# Patient Record
Sex: Male | Born: 1970
Health system: Southern US, Community
[De-identification: ages and names within clinical notes are randomized; demographics above are authoritative.]

## PROBLEM LIST (undated history)

## (undated) DIAGNOSIS — Z8719 Personal history of other diseases of the digestive system: Secondary | ICD-10-CM

## (undated) DIAGNOSIS — R079 Chest pain, unspecified: Secondary | ICD-10-CM

## (undated) DIAGNOSIS — E785 Hyperlipidemia, unspecified: Secondary | ICD-10-CM

## (undated) DIAGNOSIS — F329 Major depressive disorder, single episode, unspecified: Secondary | ICD-10-CM

## (undated) DIAGNOSIS — F419 Anxiety disorder, unspecified: Secondary | ICD-10-CM

## (undated) DIAGNOSIS — I2510 Atherosclerotic heart disease of native coronary artery without angina pectoris: Secondary | ICD-10-CM

## (undated) DIAGNOSIS — K219 Gastro-esophageal reflux disease without esophagitis: Secondary | ICD-10-CM

## (undated) DIAGNOSIS — F32A Depression, unspecified: Secondary | ICD-10-CM

## (undated) DIAGNOSIS — F411 Generalized anxiety disorder: Secondary | ICD-10-CM

## (undated) DIAGNOSIS — I214 Non-ST elevation (NSTEMI) myocardial infarction: Secondary | ICD-10-CM

## (undated) HISTORY — PX: FRACTURE SURGERY: SHX138

## (undated) HISTORY — DX: Hyperlipidemia, unspecified: E78.5

## (undated) HISTORY — DX: Generalized anxiety disorder: F41.1

## (undated) HISTORY — PX: ORIF TIBIA & FIBULA FRACTURES: SHX2131

---

## 2009-10-03 ENCOUNTER — Emergency Department (HOSPITAL_COMMUNITY): Admission: EM | Admit: 2009-10-03 | Discharge: 2009-10-03 | Payer: Self-pay | Admitting: Emergency Medicine

## 2010-12-10 LAB — COMPREHENSIVE METABOLIC PANEL
Alkaline Phosphatase: 100 U/L (ref 39–117)
CO2: 24 mEq/L (ref 19–32)
Calcium: 9.5 mg/dL (ref 8.4–10.5)
Chloride: 106 mEq/L (ref 96–112)
GFR calc Af Amer: >60 mL/min (ref >60)
Glucose, Bld: 125 mg/dL — ABNORMAL HIGH (ref 70–99)
Sodium: 140 mEq/L (ref 135–145)

## 2010-12-10 LAB — DIFFERENTIAL
Eosinophils Absolute: 0.2 10*3/uL (ref 0.0–0.7)
Eosinophils Relative: 2 % (ref 0–5)
Lymphocytes Relative: 31 % (ref 12–46)
Lymphs Abs: 4 10*3/uL (ref 0.7–4.0)
Neutro Abs: 7.6 10*3/uL (ref 1.7–7.7)
Neutrophils Relative %: 59 % (ref 43–77)

## 2010-12-10 LAB — CBC
HCT: 45.7 % (ref 39.0–52.0)
MCHC: 34.5 g/dL (ref 30.0–36.0)
MCV: 93.8 fL (ref 78.0–100.0)
Platelets: 390 10*3/uL (ref 150–400)
WBC: 12.9 10*3/uL — ABNORMAL HIGH (ref 4.0–10.5)

## 2010-12-10 LAB — CK TOTAL AND CKMB (NOT AT ARMC)
CK, MB: 1 ng/mL (ref 0.3–4.0)
Relative Index: 0.9 (ref 0.0–2.5)

## 2010-12-10 LAB — GLUCOSE, CAPILLARY

## 2010-12-10 LAB — TROPONIN I: Troponin I: 0.01 ng/mL (ref 0.00–0.06)

## 2011-03-09 ENCOUNTER — Emergency Department (HOSPITAL_COMMUNITY)
Admission: EM | Admit: 2011-03-09 | Discharge: 2011-03-09 | Disposition: A | Discharge status: Home / Self Care | Payer: 59 | Attending: Emergency Medicine | Admitting: Emergency Medicine

## 2011-03-09 ENCOUNTER — Emergency Department (HOSPITAL_COMMUNITY): Payer: 59

## 2011-03-09 DIAGNOSIS — K2970 Gastritis, unspecified, without bleeding: Secondary | ICD-10-CM | POA: Insufficient documentation

## 2011-03-09 DIAGNOSIS — K224 Dyskinesia of esophagus: Secondary | ICD-10-CM | POA: Insufficient documentation

## 2011-03-09 DIAGNOSIS — R209 Unspecified disturbances of skin sensation: Secondary | ICD-10-CM | POA: Insufficient documentation

## 2011-03-09 DIAGNOSIS — K2990 Gastroduodenitis, unspecified, without bleeding: Secondary | ICD-10-CM | POA: Insufficient documentation

## 2011-03-09 DIAGNOSIS — I10 Essential (primary) hypertension: Secondary | ICD-10-CM | POA: Insufficient documentation

## 2011-03-09 DIAGNOSIS — R0989 Other specified symptoms and signs involving the circulatory and respiratory systems: Secondary | ICD-10-CM | POA: Insufficient documentation

## 2011-03-09 DIAGNOSIS — K219 Gastro-esophageal reflux disease without esophagitis: Secondary | ICD-10-CM | POA: Insufficient documentation

## 2011-03-09 DIAGNOSIS — R0789 Other chest pain: Secondary | ICD-10-CM | POA: Insufficient documentation

## 2011-03-09 DIAGNOSIS — F411 Generalized anxiety disorder: Secondary | ICD-10-CM | POA: Insufficient documentation

## 2011-03-09 DIAGNOSIS — R110 Nausea: Secondary | ICD-10-CM | POA: Insufficient documentation

## 2011-03-09 DIAGNOSIS — R0609 Other forms of dyspnea: Secondary | ICD-10-CM | POA: Insufficient documentation

## 2011-03-09 LAB — CBC
Hemoglobin: 14.9 g/dL (ref 13.0–17.0)
MCHC: 33 g/dL (ref 30.0–36.0)
MCV: 88.3 fL (ref 78.0–100.0)
RBC: 5.11 MIL/uL (ref 4.22–5.81)
RDW: 13.2 % (ref 11.5–15.5)

## 2011-03-09 LAB — CK TOTAL AND CKMB (NOT AT ARMC)
CK, MB: 1.3 ng/mL (ref 0.3–4.0)
Relative Index: 1.1 (ref 0.0–2.5)
Relative Index: 1.1 (ref 0.0–2.5)
Total CK: 122 U/L (ref 7–232)
Total CK: 120 U/L (ref 7–232)

## 2011-03-09 LAB — COMPREHENSIVE METABOLIC PANEL
ALT: 29 U/L (ref 0–53)
BUN: 11 mg/dL (ref 6–23)
CO2: 27 mEq/L (ref 19–32)
Chloride: 104 mEq/L (ref 96–112)
Glucose, Bld: 96 mg/dL (ref 70–99)
Potassium: 3.9 mEq/L (ref 3.5–5.1)
Total Bilirubin: 0.4 mg/dL (ref 0.3–1.2)

## 2011-03-09 LAB — DIFFERENTIAL
Basophils Absolute: 0 10*3/uL (ref 0.0–0.1)
Basophils Relative: 0 % (ref 0–1)
Lymphocytes Relative: 12 % (ref 12–46)
Lymphs Abs: 1.6 10*3/uL (ref 0.7–4.0)
Monocytes Absolute: 0.8 10*3/uL (ref 0.1–1.0)
Monocytes Relative: 6 % (ref 3–12)
Neutrophils Relative %: 81 % — ABNORMAL HIGH (ref 43–77)

## 2011-03-09 LAB — TROPONIN I: Troponin I: <0.3 ng/mL (ref <0.30)

## 2013-01-22 ENCOUNTER — Telehealth: Payer: Self-pay | Admitting: Family Medicine

## 2013-01-22 NOTE — Telephone Encounter (Signed)
Please let patient know when alprazolam is called in

## 2013-02-23 ENCOUNTER — Telehealth: Payer: Self-pay | Admitting: Family Medicine

## 2013-02-23 NOTE — Telephone Encounter (Signed)
Patient needs a prescription for enough Xanax to get him through until his appointment on Monday June 9th  Thanks

## 2013-02-23 NOTE — Telephone Encounter (Signed)
Refill called into [facility]. Patient notified.

## 2013-02-23 NOTE — Telephone Encounter (Signed)
Refill times one, keep appointment

## 2013-02-24 ENCOUNTER — Encounter: Payer: Self-pay | Admitting: *Deleted

## 2013-03-02 ENCOUNTER — Ambulatory Visit (INDEPENDENT_AMBULATORY_CARE_PROVIDER_SITE_OTHER): Payer: Self-pay | Admitting: Family Medicine

## 2013-03-02 ENCOUNTER — Encounter: Payer: Self-pay | Admitting: Family Medicine

## 2013-03-02 VITALS — BP 142/90 | HR 88 | Wt 223.8 lb

## 2013-03-02 DIAGNOSIS — Z79899 Other long term (current) drug therapy: Secondary | ICD-10-CM

## 2013-03-02 DIAGNOSIS — F411 Generalized anxiety disorder: Secondary | ICD-10-CM

## 2013-03-02 DIAGNOSIS — E785 Hyperlipidemia, unspecified: Secondary | ICD-10-CM | POA: Insufficient documentation

## 2013-03-02 MED ORDER — PRAVASTATIN SODIUM 80 MG PO TABS: 80.0000 mg | ORAL_TABLET | Freq: Every day | ORAL | Status: DC

## 2013-03-02 MED ORDER — CITALOPRAM HYDROBROMIDE 20 MG PO TABS: 20.0000 mg | ORAL_TABLET | Freq: Every day | ORAL | Status: DC

## 2013-03-02 MED ORDER — ALPRAZOLAM 0.5 MG PO TABS: 0.5000 mg | ORAL_TABLET | Freq: Four times a day (QID) | ORAL | Status: DC | PRN

## 2013-03-02 NOTE — Patient Instructions (Signed)
Hypertension Hypertension is another name for high blood pressure. High blood pressure may mean that your heart needs to work harder to pump blood. Blood pressure consists of two numbers, which includes a higher number over a lower number (example: 110/72). HOME CARE   Make lifestyle changes as told by your doctor. This may include weight loss and exercise.  Take your blood pressure medicine every day.  Limit how much salt you use.  Stop smoking if you smoke.  Do not use drugs.  Talk to your doctor if you are using decongestants or birth control pills. These medicines might make blood pressure higher.  Females should not drink more than 1 alcoholic drink per day. Males should not drink more than 2 alcoholic drinks per day.  See your doctor as told. GET HELP RIGHT AWAY IF:   You have a blood pressure reading with a top number of 180 or higher.  You get a very bad headache.  You get blurred or changing vision.  You feel confused.  You feel weak, numb, or faint.  You get chest or belly (abdominal) pain.  You throw up (vomit).  You cannot breathe very well. MAKE SURE YOU:   Understand these instructions.  Will watch your condition.  Will get help right away if you are not doing well or get worse. Document Released: 02/27/2008 Document Revised: 12/03/2011 Document Reviewed: 02/27/2008 [facility] Patient Information 2014 [place], [place].   DASH Diet The DASH diet stands for "Dietary Approaches to Stop Hypertension." It is a healthy eating plan that has been shown to reduce high blood pressure (hypertension) in as little as 14 days, while also possibly providing other significant health benefits. These other health benefits include reducing the risk of breast cancer after menopause and reducing the risk of type 2 diabetes, heart disease, colon cancer, and stroke. Health benefits also include weight loss and slowing kidney failure in patients with chronic kidney disease.  DIET  GUIDELINES  Limit salt (sodium). Your diet should contain less than 1500 mg of sodium daily.  Limit refined or processed carbohydrates. Your diet should include mostly whole grains. Desserts and added sugars should be used sparingly.  Include small amounts of heart-healthy fats. These types of fats include nuts, oils, and tub margarine. Limit saturated and trans fats. These fats have been shown to be harmful in the body. CHOOSING FOODS  The following food groups are based on a 2000 calorie diet. See your Registered Dietitian for individual calorie needs. Grains and Grain Products (6 to 8 servings daily)  Eat More Often: Whole-wheat bread, brown rice, whole-grain or wheat pasta, quinoa, popcorn without added fat or salt (air popped).  Eat Less Often: White bread, white pasta, white rice, cornbread. Vegetables (4 to 5 servings daily)  Eat More Often: Fresh, frozen, and canned vegetables. Vegetables may be raw, steamed, roasted, or grilled with a minimal amount of fat.  Eat Less Often/Avoid: Creamed or fried vegetables. Vegetables in a cheese sauce. Fruit (4 to 5 servings daily)  Eat More Often: All fresh, canned (in natural juice), or frozen fruits. Dried fruits without added sugar. One hundred percent fruit juice ( cup [237 mL] daily).  Eat Less Often: Dried fruits with added sugar. Canned fruit in light or heavy syrup. [employer], Fish, and Poultry (2 servings or less daily. One serving is 3 to 4 oz [85-114 g]).  Eat More Often: Ninety percent or leaner ground beef, tenderloin, sirloin. Round cuts of beef, chicken breast, [place] breast. All fish.  Grill, bake, or broil your meat. Nothing should be fried.  Eat Less Often/Avoid: Fatty cuts of meat, [place], or chicken leg, thigh, or wing. Fried cuts of meat or fish. Dairy (2 to 3 servings)  Eat More Often: Low-fat or fat-free milk, low-fat plain or light yogurt, reduced-fat or part-skim cheese.  Eat Less Often/Avoid: Milk (whole,  2%).Whole milk yogurt. Full-fat cheeses. Nuts, Seeds, and Legumes (4 to 5 servings per week)  Eat More Often: All without added salt.  Eat Less Often/Avoid: Salted nuts and seeds, canned beans with added salt. Fats and Sweets (limited)  Eat More Often: Vegetable oils, tub margarines without trans fats, sugar-free gelatin. Mayonnaise and salad dressings.  Eat Less Often/Avoid: Coconut oils, palm oils, butter, stick margarine, cream, half and half, cookies, candy, pie. FOR MORE INFORMATION The Dash Diet Eating Plan: www.dashdiet.org Document Released: 08/30/2011 Document Revised: 12/03/2011 Document Reviewed: 08/30/2011 [facility] Patient Information 2014 [place], [place].

## 2013-03-02 NOTE — Progress Notes (Signed)
  Subjective:    Patient ID: Bruce King, male    DOB: 09-22-1971, 42 y.o.   MRN: [id number]  HPI No chest tightness pressure pain shortness of breath he states he takes his medicine as directed he does smoke he knows he needs to quit. He does not exercise on a regular basis. Now working 2 jobs. Feels overall he's doing fairly good. Would like refills on his medication. He denies abusing his medication.   Review of Systems  Constitutional: Negative for appetite change and fatigue.  Respiratory: Negative for cough, chest tightness and shortness of breath.   Cardiovascular: Negative for chest pain.  Gastrointestinal: Negative for abdominal pain.  Musculoskeletal: Positive for arthralgias (with activity).  Psychiatric/Behavioral:       Xanax and celexa does well for anxiety       Objective:   Physical Exam  Vitals reviewed. Constitutional: He appears well-developed.  HENT:  Right Ear: External ear normal.  Left Ear: External ear normal.  Mouth/Throat: Oropharynx is clear and moist.  Neck: No thyromegaly present.  Cardiovascular: Normal rate, regular rhythm and normal heart sounds.   Pulmonary/Chest: Effort normal and breath sounds normal. No respiratory distress.  Musculoskeletal: He exhibits no edema.  Lymphadenopathy:    He has no cervical adenopathy.          Assessment & Plan:  Anxiety-Celexa and Xanax as directed. Hyperlipidemia check lab work. Continue medication. He uses medicinemedication lays off of that if he is having arthralgias. He was given a month's supply of Xanax with 4 refills this should last him for 5 months he needs to followup office visit before more.

## 2013-08-10 ENCOUNTER — Other Ambulatory Visit: Payer: Self-pay | Admitting: *Deleted

## 2013-08-10 MED ORDER — ALPRAZOLAM 0.5 MG PO TABS: 0.5000 mg | ORAL_TABLET | Freq: Four times a day (QID) | ORAL | Status: DC | PRN

## 2013-09-11 ENCOUNTER — Telehealth: Payer: Self-pay | Admitting: Family Medicine

## 2013-09-11 ENCOUNTER — Other Ambulatory Visit: Payer: Self-pay

## 2013-09-11 MED ORDER — ALPRAZOLAM 0.5 MG PO TABS: 0.5000 mg | ORAL_TABLET | Freq: Four times a day (QID) | ORAL | Status: DC | PRN

## 2013-09-11 NOTE — Telephone Encounter (Signed)
Notified patient script will be faxed to pharmacy. Patient verbalized understanding.

## 2013-09-11 NOTE — Telephone Encounter (Signed)
Patient needs Rx for xanax to [facility]

## 2013-09-11 NOTE — Telephone Encounter (Signed)
Last office visit 03/02/13 

## 2013-09-11 NOTE — Telephone Encounter (Signed)
Xanax #60, 1 qid prn, needs OV

## 2013-09-29 ENCOUNTER — Ambulatory Visit (INDEPENDENT_AMBULATORY_CARE_PROVIDER_SITE_OTHER): Payer: Self-pay | Admitting: Family Medicine

## 2013-09-29 ENCOUNTER — Encounter: Payer: Self-pay | Admitting: Family Medicine

## 2013-09-29 VITALS — BP 124/84 | Ht 73.0 in | Wt 220.0 lb

## 2013-09-29 DIAGNOSIS — R358 Other polyuria: Secondary | ICD-10-CM

## 2013-09-29 DIAGNOSIS — R3589 Other polyuria: Secondary | ICD-10-CM

## 2013-09-29 DIAGNOSIS — F411 Generalized anxiety disorder: Secondary | ICD-10-CM

## 2013-09-29 DIAGNOSIS — E785 Hyperlipidemia, unspecified: Secondary | ICD-10-CM

## 2013-09-29 MED ORDER — ALPRAZOLAM 0.5 MG PO TABS: 0.5000 mg | ORAL_TABLET | Freq: Four times a day (QID) | ORAL | Status: DC | PRN

## 2013-09-29 NOTE — Progress Notes (Signed)
   Subjective:    Patient ID: Bruce King, male    DOB: 11/07/1970, 43 y.o.   MRN: [id number]  HPIHere for a med check. No concerns.  Still smoking- knows he needs to quit Watching diet Working 60 hours a week No chest pain no DOE He does relate that he needs to quit smoking he's planning on trying to do stuff  Review of Systems    he denies headaches shortness breath chest tightness pressure pain vomiting or diarrhea Objective:   Physical Exam Lungs are clear hearts regular pulse normal extremities no edema blood pressure good       Assessment & Plan:  hyperlip- using very other day, will check labs Anxiety stable, refills given followup 6 months

## 2013-09-29 NOTE — Patient Instructions (Signed)
DASH Diet  The DASH diet stands for "Dietary Approaches to Stop Hypertension." It is a healthy eating plan that has been shown to reduce high blood pressure (hypertension) in as little as 14 days, while also possibly providing other significant health benefits. These other health benefits include reducing the risk of breast cancer after menopause and reducing the risk of type 2 diabetes, heart disease, colon cancer, and stroke. Health benefits also include weight loss and slowing kidney failure in patients with chronic kidney disease.   DIET GUIDELINES  · Limit salt (sodium). Your diet should contain less than 1500 mg of sodium daily.  · Limit refined or processed carbohydrates. Your diet should include mostly whole grains. Desserts and added sugars should be used sparingly.  · Include small amounts of heart-healthy fats. These types of fats include nuts, oils, and tub margarine. Limit saturated and trans fats. These fats have been shown to be harmful in the body.  CHOOSING FOODS   The following food groups are based on a 2000 calorie diet. See your Registered Dietitian for individual calorie needs.  Grains and Grain Products (6 to 8 servings daily)  · Eat More Often: Whole-wheat bread, brown rice, whole-grain or wheat pasta, quinoa, popcorn without added fat or salt (air popped).  · Eat Less Often: White bread, white pasta, white rice, cornbread.  Vegetables (4 to 5 servings daily)  · Eat More Often: Fresh, frozen, and canned vegetables. Vegetables may be raw, steamed, roasted, or grilled with a minimal amount of fat.  · Eat Less Often/Avoid: Creamed or fried vegetables. Vegetables in a cheese sauce.  Fruit (4 to 5 servings daily)  · Eat More Often: All fresh, canned (in natural juice), or frozen fruits. Dried fruits without added sugar. One hundred percent fruit juice (½ cup [237 mL] daily).  · Eat Less Often: Dried fruits with added sugar. Canned fruit in light or heavy syrup.  Lean Meats, Fish, and Poultry (2  servings or less daily. One serving is 3 to 4 oz [85-114 g]).  · Eat More Often: Ninety percent or leaner ground beef, tenderloin, sirloin. Round cuts of beef, chicken breast, turkey breast. All fish. Grill, bake, or broil your meat. Nothing should be fried.  · Eat Less Often/Avoid: Fatty cuts of meat, turkey, or chicken leg, thigh, or wing. Fried cuts of meat or fish.  Dairy (2 to 3 servings)  · Eat More Often: Low-fat or fat-free milk, low-fat plain or light yogurt, reduced-fat or part-skim cheese.  · Eat Less Often/Avoid: Milk (whole, 2%). Whole milk yogurt. Full-fat cheeses.  Nuts, Seeds, and Legumes (4 to 5 servings per week)  · Eat More Often: All without added salt.  · Eat Less Often/Avoid: Salted nuts and seeds, canned beans with added salt.  Fats and Sweets (limited)  · Eat More Often: Vegetable oils, tub margarines without trans fats, sugar-free gelatin. Mayonnaise and salad dressings.  · Eat Less Often/Avoid: Coconut oils, palm oils, butter, stick margarine, cream, half and half, cookies, candy, pie.  FOR MORE INFORMATION  The Dash Diet Eating Plan: www.dashdiet.org  Document Released: 08/30/2011 Document Revised: 12/03/2011 Document Reviewed: 08/30/2011  ExitCare® Patient Information ©2014 [employer].

## 2014-03-29 ENCOUNTER — Telehealth: Payer: Self-pay

## 2014-03-29 MED ORDER — ALPRAZOLAM 0.5 MG PO TABS: 0.5000 mg | ORAL_TABLET | Freq: Four times a day (QID) | ORAL | Status: DC | PRN

## 2014-03-29 NOTE — Telephone Encounter (Signed)
Needs refill on Alprazolam 0.5 mg. Last seen 09/29/13.

## 2014-03-29 NOTE — Telephone Encounter (Signed)
Ok times one mo needs ov before further

## 2014-03-29 NOTE — Telephone Encounter (Signed)
Script printed and will be faxed to pharmacy today.

## 2014-04-15 ENCOUNTER — Other Ambulatory Visit: Payer: Self-pay | Admitting: *Deleted

## 2014-04-15 ENCOUNTER — Telehealth: Payer: Self-pay | Admitting: Family Medicine

## 2014-04-15 MED ORDER — CITALOPRAM HYDROBROMIDE 20 MG PO TABS: 20.0000 mg | ORAL_TABLET | Freq: Every day | ORAL | Status: DC

## 2014-04-15 NOTE — Telephone Encounter (Signed)
Patient notified and verbalized understanding. I then transferred pt up front to schedule OV

## 2014-04-15 NOTE — Telephone Encounter (Signed)
Pt needs RF on his Celexa, pharmacy states they've sent it to us twice this week and we've not done anything with it.  [facility], please call pt when done  Told pt he should hear from us later this afternoon,

## 2014-04-29 ENCOUNTER — Telehealth: Payer: Self-pay | Admitting: *Deleted

## 2014-04-29 MED ORDER — ALPRAZOLAM 0.5 MG PO TABS: 0.5000 mg | ORAL_TABLET | Freq: Four times a day (QID) | ORAL | Status: DC | PRN

## 2014-04-29 NOTE — Addendum Note (Signed)
Addended by: Margaretha SheffieldBROWN, Alanie Syler S on: 04/29/2014 03:30 PM   Modules accepted: Orders

## 2014-04-29 NOTE — Telephone Encounter (Signed)
Pt called stating his pharmacy sent in a refill request per pt he needs his refills ASAP. Please advise [phone]

## 2014-04-29 NOTE — Telephone Encounter (Signed)
Needs refill of xanax was last seen 1/15 and advised last month he needs office visit.

## 2014-04-29 NOTE — Telephone Encounter (Signed)
Rx faxed to pharmacy. Patient states he has an appt next week to follow up with doctor.

## 2014-04-29 NOTE — Telephone Encounter (Signed)
He may have a day prescription. Needs office visit next week, this type of medication requires regular office visits.

## 2014-05-06 ENCOUNTER — Encounter: Payer: Self-pay | Admitting: Family Medicine

## 2014-05-06 ENCOUNTER — Ambulatory Visit (INDEPENDENT_AMBULATORY_CARE_PROVIDER_SITE_OTHER): Payer: Self-pay | Admitting: Family Medicine

## 2014-05-06 VITALS — BP 140/86 | Ht 73.0 in | Wt 223.4 lb

## 2014-05-06 DIAGNOSIS — E785 Hyperlipidemia, unspecified: Secondary | ICD-10-CM

## 2014-05-06 DIAGNOSIS — F411 Generalized anxiety disorder: Secondary | ICD-10-CM

## 2014-05-06 MED ORDER — ALPRAZOLAM 0.5 MG PO TABS: 0.5000 mg | ORAL_TABLET | Freq: Four times a day (QID) | ORAL | Status: DC | PRN

## 2014-05-06 MED ORDER — ATORVASTATIN CALCIUM 10 MG PO TABS: 10.0000 mg | ORAL_TABLET | Freq: Every day | ORAL | Status: DC

## 2014-05-06 MED ORDER — CITALOPRAM HYDROBROMIDE 20 MG PO TABS: 20.0000 mg | ORAL_TABLET | Freq: Every day | ORAL | Status: DC

## 2014-05-06 NOTE — Patient Instructions (Signed)
DASH Eating Plan °DASH stands for "Dietary Approaches to Stop Hypertension." The DASH eating plan is a healthy eating plan that has been shown to reduce high blood pressure (hypertension). Additional health benefits may include reducing the risk of type 2 diabetes mellitus, heart disease, and stroke. The DASH eating plan may also help with weight loss. °WHAT DO I NEED TO KNOW ABOUT THE DASH EATING PLAN? °For the DASH eating plan, you will follow these general guidelines: °· Choose foods with a percent daily value for sodium of less than 5% (as listed on the food label). °· Use salt-free seasonings or herbs instead of table salt or sea salt. °· Check with your health care provider or pharmacist before using salt substitutes. °· Eat lower-sodium products, often labeled as "lower sodium" or "no salt added." °· Eat fresh foods. °· Eat more vegetables, fruits, and low-fat dairy products. °· Choose whole grains. Look for the word "whole" as the first word in the ingredient list. °· Choose fish and skinless chicken or turkey more often than red meat. Limit fish, poultry, and meat to 6 oz (170 g) each day. °· Limit sweets, desserts, sugars, and sugary drinks. °· Choose heart-healthy fats. °· Limit cheese to 1 oz (28 g) per day. °· Eat more home-cooked food and less restaurant, buffet, and fast food. °· Limit fried foods. °· Cook foods using methods other than frying. °· Limit canned vegetables. If you do use them, rinse them well to decrease the sodium. °· When eating at a restaurant, ask that your food be prepared with less salt, or no salt if possible. °WHAT FOODS CAN I EAT? °Seek help from a dietitian for individual calorie needs. °Grains °Whole grain or whole wheat bread. Brown rice. Whole grain or whole wheat pasta. Quinoa, bulgur, and whole grain cereals. Low-sodium cereals. Corn or whole wheat flour tortillas. Whole grain cornbread. Whole grain crackers. Low-sodium crackers. °Vegetables °Fresh or frozen vegetables  (raw, steamed, roasted, or grilled). Low-sodium or reduced-sodium tomato and vegetable juices. Low-sodium or reduced-sodium tomato sauce and paste. Low-sodium or reduced-sodium canned vegetables.  °Fruits °All fresh, canned (in natural juice), or frozen fruits. °Meat and Other Protein Products °Ground beef (85% or leaner), grass-fed beef, or beef trimmed of fat. Skinless chicken or turkey. Ground chicken or turkey. Pork trimmed of fat. All fish and seafood. Eggs. Dried beans, peas, or lentils. Unsalted nuts and seeds. Unsalted canned beans. °Dairy °Low-fat dairy products, such as skim or 1% milk, 2% or reduced-fat cheeses, low-fat ricotta or cottage cheese, or plain low-fat yogurt. Low-sodium or reduced-sodium cheeses. °Fats and Oils °Tub margarines without trans fats. Light or reduced-fat mayonnaise and salad dressings (reduced sodium). Avocado. Safflower, olive, or canola oils. Natural peanut or almond butter. °Other °Unsalted popcorn and pretzels. °The items listed above may not be a complete list of recommended foods or beverages. Contact your dietitian for more options. °WHAT FOODS ARE NOT RECOMMENDED? °Grains °White bread. White pasta. White rice. Refined cornbread. Bagels and croissants. Crackers that contain trans fat. °Vegetables °Creamed or fried vegetables. Vegetables in a cheese sauce. Regular canned vegetables. Regular canned tomato sauce and paste. Regular tomato and vegetable juices. °Fruits °Dried fruits. Canned fruit in light or heavy syrup. Fruit juice. °Meat and Other Protein Products °Fatty cuts of meat. Ribs, chicken wings, bacon, sausage, bologna, salami, chitterlings, fatback, hot dogs, bratwurst, and packaged luncheon meats. Salted nuts and seeds. Canned beans with salt. °Dairy °Whole or 2% milk, cream, half-and-half, and cream cheese. Whole-fat or sweetened yogurt. Full-fat   cheeses or blue cheese. Nondairy creamers and whipped toppings. Processed cheese, cheese spreads, or cheese  curds. °Condiments °Onion and garlic salt, seasoned salt, table salt, and sea salt. Canned and packaged gravies. Worcestershire sauce. Tartar sauce. Barbecue sauce. Teriyaki sauce. Soy sauce, including reduced sodium. Steak sauce. Fish sauce. Oyster sauce. Cocktail sauce. Horseradish. Ketchup and mustard. Meat flavorings and tenderizers. Bouillon cubes. Hot sauce. Tabasco sauce. Marinades. Taco seasonings. Relishes. °Fats and Oils °Butter, stick margarine, lard, shortening, ghee, and bacon fat. Coconut, palm kernel, or palm oils. Regular salad dressings. °Other °Pickles and olives. Salted popcorn and pretzels. °The items listed above may not be a complete list of foods and beverages to avoid. Contact your dietitian for more information. °WHERE CAN I FIND MORE INFORMATION? °National Heart, Lung, and Blood Institute: www.nhlbi.nih.gov/health/health-topics/topics/dash/ °Document Released: 08/30/2011 Document Revised: 01/25/2014 Document Reviewed: 07/15/2013 °ExitCare® Patient Information ©2015 [employer]. This information is not intended to replace advice given to you by your health care provider. Make sure you discuss any questions you have with your health care provider. ° °

## 2014-05-06 NOTE — Progress Notes (Signed)
   Subjective:    Patient ID: Bruce King, male    DOB: 11/05/1970, 43 y.o.   MRN: [id number]  Hyperlipidemia This is a chronic problem. The current episode started more than 1 year ago. Current antihyperlipidemic treatment includes statins (pravastatin). Risk factors for coronary artery disease include dyslipidemia.   Denies being depressed does have some anxiety issues periods no smokes he knows he needs to quit unable to tolerate pravastatin due to soreness but wants to be on a statin. Patient cannot afford brand name. Wants generic Lipitor.   Review of Systems Patient denies chest pain shortness of breath nausea vomiting diarrhea does relate 1 panic attack since last being seen    Objective:   Physical Exam  His lungs are clear heart regular pulse normal blood pressure was checked twice 124/88 extremities no edema skin warm dry      Assessment & Plan:  Blood pressure borderline gas minimize salt use. Try to quit smoking. Stay physically active. Try to lose weight.  Chronic anxiety issues refills on medications given followup in 6 months  Patient counseled to quit smoking  Hyperlipidemia did not tolerate pravastatin try Lipitor 10 mg daily patient will call in October in order for ostial order lipid and liver profile followup 6 months

## 2014-08-11 ENCOUNTER — Encounter: Payer: Self-pay | Admitting: Family Medicine

## 2014-08-11 ENCOUNTER — Ambulatory Visit (INDEPENDENT_AMBULATORY_CARE_PROVIDER_SITE_OTHER): Payer: Self-pay | Admitting: Family Medicine

## 2014-08-11 VITALS — BP 134/90 | Ht 73.0 in | Wt 233.0 lb

## 2014-08-11 DIAGNOSIS — M5412 Radiculopathy, cervical region: Secondary | ICD-10-CM

## 2014-08-11 MED ORDER — PREDNISONE 20 MG PO TABS: ORAL_TABLET | ORAL | Status: DC

## 2014-08-11 MED ORDER — HYDROCODONE-ACETAMINOPHEN 10-325 MG PO TABS: 1.0000 | ORAL_TABLET | ORAL | Status: DC | PRN

## 2014-08-11 NOTE — Progress Notes (Signed)
   Subjective:    Patient ID: Bruce King, male    DOB: 10/28/1970, 43 y.o.   MRN: [id number]  Shoulder Pain  The pain is present in the right shoulder and right arm. This is a new problem. Episode onset: About 6 weeks ago. There has been no history of extremity trauma. The problem occurs constantly. The problem has been gradually worsening. The quality of the pain is described as sharp. Associated symptoms include numbness and tingling. The symptoms are aggravated by activity. He has tried NSAIDS for the symptoms. The treatment provided mild relief.    Patient relates the pain is in the upper portion of his back neck mainly in his shoulder radiates down the bicep into the hand causes numbness into the hand present for about 6 weeks  Review of Systems  Neurological: Positive for tingling and numbness.       Objective:   Physical Exam  Neck patient has increased discomfort with rotation to the right and tilting to the right subjective discomfort into the trapezius subjective discomfort into the shoulder internal/external rotation of the shoulder is normal no obvious sign of rotator cuff tear. Reflexes good strength in the hand muscles are good.      Assessment & Plan:  Significant cervical neuralgia prednisone taper hydrocodone for pain cautioned drowsiness not for long-term use #30 follow-up in several weeks may need referral for further testing. Hopefully this will get better without. If weakness follow-up immediately

## 2014-08-22 ENCOUNTER — Encounter (HOSPITAL_COMMUNITY): Payer: Self-pay | Admitting: *Deleted

## 2014-08-22 ENCOUNTER — Emergency Department (HOSPITAL_COMMUNITY): Payer: 59

## 2014-08-22 ENCOUNTER — Emergency Department (HOSPITAL_COMMUNITY)
Admission: EM | Admit: 2014-08-22 | Discharge: 2014-08-22 | Disposition: A | Discharge status: Home / Self Care | Payer: 59 | Attending: Emergency Medicine | Admitting: Emergency Medicine

## 2014-08-22 DIAGNOSIS — M545 Low back pain: Secondary | ICD-10-CM | POA: Insufficient documentation

## 2014-08-22 DIAGNOSIS — R0781 Pleurodynia: Secondary | ICD-10-CM | POA: Insufficient documentation

## 2014-08-22 DIAGNOSIS — Z7952 Long term (current) use of systemic steroids: Secondary | ICD-10-CM | POA: Insufficient documentation

## 2014-08-22 DIAGNOSIS — Z720 Tobacco use: Secondary | ICD-10-CM | POA: Insufficient documentation

## 2014-08-22 DIAGNOSIS — E785 Hyperlipidemia, unspecified: Secondary | ICD-10-CM | POA: Insufficient documentation

## 2014-08-22 DIAGNOSIS — F411 Generalized anxiety disorder: Secondary | ICD-10-CM | POA: Insufficient documentation

## 2014-08-22 DIAGNOSIS — Z79899 Other long term (current) drug therapy: Secondary | ICD-10-CM | POA: Insufficient documentation

## 2014-08-22 LAB — CBC WITH DIFFERENTIAL/PLATELET
BASOS ABS: 0 10*3/uL (ref 0.0–0.1)
BASOS PCT: 0 % (ref 0–1)
Eosinophils Absolute: 0.1 10*3/uL (ref 0.0–0.7)
Eosinophils Relative: 1 % (ref 0–5)
HCT: 46.3 % (ref 39.0–52.0)
Hemoglobin: 15.9 g/dL (ref 13.0–17.0)
LYMPHS PCT: 18 % (ref 12–46)
Lymphs Abs: 2.4 10*3/uL (ref 0.7–4.0)
MCH: 31.1 pg (ref 26.0–34.0)
MCHC: 34.3 g/dL (ref 30.0–36.0)
MCV: 90.6 fL (ref 78.0–100.0)
Monocytes Absolute: 0.9 10*3/uL (ref 0.1–1.0)
Monocytes Relative: 7 % (ref 3–12)
NEUTROS ABS: 10.1 10*3/uL — AB (ref 1.7–7.7)
NEUTROS PCT: 74 % (ref 43–77)
PLATELETS: 314 10*3/uL (ref 150–400)
RBC: 5.11 MIL/uL (ref 4.22–5.81)
RDW: 13.3 % (ref 11.5–15.5)
WBC: 13.5 10*3/uL — AB (ref 4.0–10.5)

## 2014-08-22 LAB — COMPREHENSIVE METABOLIC PANEL
ALT: 57 U/L — AB (ref 0–53)
AST: 26 U/L (ref 0–37)
Albumin: 3.8 g/dL (ref 3.5–5.2)
Alkaline Phosphatase: 108 U/L (ref 39–117)
Anion gap: 15 (ref 5–15)
BILIRUBIN TOTAL: 0.4 mg/dL (ref 0.3–1.2)
BUN: 10 mg/dL (ref 6–23)
CHLORIDE: 95 meq/L — AB (ref 96–112)
CO2: 25 meq/L (ref 19–32)
Calcium: 9.6 mg/dL (ref 8.4–10.5)
Creatinine, Ser: 1.15 mg/dL (ref 0.50–1.35)
GFR calc Af Amer: 89 mL/min — ABNORMAL LOW (ref >90)
GFR calc non Af Amer: 77 mL/min — ABNORMAL LOW (ref >90)
Glucose, Bld: 107 mg/dL — ABNORMAL HIGH (ref 70–99)
Potassium: 3.9 mEq/L (ref 3.7–5.3)
SODIUM: 135 meq/L — AB (ref 137–147)
Total Protein: 7.6 g/dL (ref 6.0–8.3)

## 2014-08-22 LAB — TROPONIN I: Troponin I: <0.3 ng/mL (ref <0.30)

## 2014-08-22 LAB — I-STAT TROPONIN, ED: Troponin i, poc: 0 ng/mL (ref 0.00–0.08)

## 2014-08-22 LAB — LIPASE, BLOOD: Lipase: 34 U/L (ref 11–59)

## 2014-08-22 MED ORDER — IOHEXOL 350 MG/ML SOLN
80.0000 mL | Freq: Once | INTRAVENOUS | Status: AC | PRN
  Administered 2014-08-22: 80 mL via INTRAVENOUS

## 2014-08-22 NOTE — ED Provider Notes (Signed)
CSN: [id number]     Arrival date & time 08/22/14  1225 History   First MD Initiated Contact with Patient 08/22/14 1234     Chief Complaint  Patient presents with  . Chest Pain     (Consider location/radiation/quality/duration/timing/severity/associated sxs/prior Treatment) HPI Comments: Patient presents to the ER for evaluation of chest pain. Patient reports that he had onset of sharp pain in his back, between the shoulder blades around 3 AM. It kept him up after it started. Patient reports that the pain is radiating around the left ribs and to the center of his chest. He has some burning sensation when he swallows. He has had nausea and vomiting. He reports that the pain worsens when he takes a deep breath, but is not short of breath.  Patient does report a family history of heart disease. His father had early heart disease and is deceased. Patient has no personal history of heart disease.  Patient is a 43 y.o. male presenting with chest pain.  Chest Pain Associated symptoms: back pain     Past Medical History  Diagnosis Date  . GAD (generalized anxiety disorder)   . Hyperlipidemia    History reviewed. No pertinent past surgical history. Family History  Problem Relation Age of Onset  . Hypertension Mother   . Heart attack Father    History  Substance Use Topics  . Smoking status: Current Every Day Smoker  . Smokeless tobacco: Not on file  . Alcohol Use: No    Review of Systems  Cardiovascular: Positive for chest pain.  Musculoskeletal: Positive for back pain.  All other systems reviewed and are negative.     Allergies  Pravastatin  Home Medications   Prior to Admission medications   Medication Sig Start Date End Date Taking? Authorizing Provider  ALPRAZolam Prudy Feeler(XANAX) 0.5 MG tablet Take 1 tablet (0.5 mg total) by mouth 4 (four) times daily as needed. 05/06/14   Babs SciaraScott A Luking, MD  atorvastatin (LIPITOR) 10 MG tablet Take 1 tablet (10 mg total) by mouth daily.  05/06/14   Babs SciaraScott A Luking, MD  citalopram (CELEXA) 20 MG tablet Take 1 tablet (20 mg total) by mouth daily. 05/06/14   Babs SciaraScott A Luking, MD  HYDROcodone-acetaminophen (NORCO) 10-325 MG per tablet Take 1 tablet by mouth every 4 (four) hours as needed. 08/11/14   Babs SciaraScott A Luking, MD  predniSONE (DELTASONE) 20 MG tablet 3qd for 3d then 2qd for 3d then 1qd for 3d 08/11/14   Babs SciaraScott A Luking, MD   There were no vitals taken for this visit. Physical Exam  Constitutional: He is oriented to person, place, and time. He appears well-developed and well-nourished. No distress.  HENT:  Head: Normocephalic and atraumatic.  Right Ear: Hearing normal.  Left Ear: Hearing normal.  Nose: Nose normal.  Mouth/Throat: Oropharynx is clear and moist and mucous membranes are normal.  Eyes: Conjunctivae and EOM are normal. Pupils are equal, round, and reactive to light.  Neck: Normal range of motion. Neck supple.  Cardiovascular: Regular rhythm, S1 normal and S2 normal.  Exam reveals no gallop and no friction rub.   No murmur heard. Pulmonary/Chest: Effort normal and breath sounds normal. No respiratory distress. He exhibits no tenderness.  Abdominal: Soft. Normal appearance and bowel sounds are normal. There is no hepatosplenomegaly. There is no tenderness. There is no rebound, no guarding, no tenderness at McBurney's point and negative Murphy's sign. No hernia.  Musculoskeletal: Normal range of motion.  Neurological: He is alert and oriented to  person, place, and time. He has normal strength. No cranial nerve deficit or sensory deficit. Coordination normal. GCS eye subscore is 4. GCS verbal subscore is 5. GCS motor subscore is 6.  Skin: Skin is warm, dry and intact. No rash noted. No cyanosis.  Psychiatric: He has a normal mood and affect. His speech is normal and behavior is normal. Thought content normal.  Nursing note and vitals reviewed.   ED Course  Procedures (including critical care time) Labs Review Labs  Reviewed  CBC WITH DIFFERENTIAL  COMPREHENSIVE METABOLIC PANEL  LIPASE, BLOOD  TROPONIN I    Imaging Review No results found.   EKG Interpretation   Date/Time:  Sunday August 22 2014 12:31:42 EST Ventricular Rate:  88 PR Interval:  144 QRS Duration: 106 QT Interval:  368 QTC Calculation: 445 R Axis:   66 Text Interpretation:  Normal sinus rhythm Possible Lateral infarct , age  undetermined Cannot rule out Inferior infarct , age undetermined Abnormal  ECG No significant change since last tracing Confirmed by POLLINA  MD,  CHRISTOPHER [phone]([id number]) on 08/22/2014 12:40:21 PM      MDM   Final diagnoses:  Pleuritic chest pain   There is a presents to the ER for evaluation of chest pain. Pain is very atypical. He has sharp pain that started between the shoulder blades and wraps around his left chest to the center chest region. Pain has been pleuritic but not exertional. His EKG did not show any acute changes from previous. Troponin was negative. PE study was negative. Patient's symptoms do not suggest cardiac etiology at all, however, patient does have a family history of heart disease. Patient reports early heart disease in his father. Because of this, will obtain second troponin. If negative, follow up with primary care doctor and provide analgesia.    Gilda Creasehristopher J. Pollina, MD 08/22/14 [phone]

## 2014-08-22 NOTE — Discharge Instructions (Signed)
We saw you in the ER for the chest pain/shortness of breath. All of our cardiac workup is normal, including labs, EKG and chest X-RAY are normal. We are not sure what is causing your discomfort, but we feel comfortable sending you home at this time. The workup in the ER is not complete, and you should follow up with your primary care doctor OR cardiologist for further evaluation.   Chest Pain (Nonspecific) It is often hard to give a specific diagnosis for the cause of chest pain. There is always a chance that your pain could be related to something serious, such as a heart attack or a blood clot in the lungs. You need to follow up with your health care provider for further evaluation. CAUSES   Heartburn.  Pneumonia or bronchitis.  Anxiety or stress.  Inflammation around your heart (pericarditis) or lung (pleuritis or pleurisy).  A blood clot in the lung.  A collapsed lung (pneumothorax). It can develop suddenly on its own (spontaneous pneumothorax) or from trauma to the chest.  Shingles infection (herpes zoster virus). The chest wall is composed of bones, muscles, and cartilage. Any of these can be the source of the pain.  The bones can be bruised by injury.  The muscles or cartilage can be strained by coughing or overwork.  The cartilage can be affected by inflammation and become sore (costochondritis). DIAGNOSIS  Lab tests or other studies may be needed to find the cause of your pain. Your health care provider may have you take a test called an ambulatory electrocardiogram (ECG). An ECG records your heartbeat patterns over a 24-hour period. You may also have other tests, such as:  Transthoracic echocardiogram (TTE). During echocardiography, sound waves are used to evaluate how blood flows through your heart.  Transesophageal echocardiogram (TEE).  Cardiac monitoring. This allows your health care provider to monitor your heart rate and rhythm in real time.  Holter monitor. This  is a portable device that records your heartbeat and can help diagnose heart arrhythmias. It allows your health care provider to track your heart activity for several days, if needed.  Stress tests by exercise or by giving medicine that makes the heart beat faster. TREATMENT   Treatment depends on what may be causing your chest pain. Treatment may include:  Acid blockers for heartburn.  Anti-inflammatory medicine.  Pain medicine for inflammatory conditions.  Antibiotics if an infection is present.  You may be advised to change lifestyle habits. This includes stopping smoking and avoiding alcohol, caffeine, and chocolate.  You may be advised to keep your head raised (elevated) when sleeping. This reduces the chance of acid going backward from your stomach into your esophagus. Most of the time, nonspecific chest pain will improve within 2-3 days with rest and mild pain medicine.  HOME CARE INSTRUCTIONS   If antibiotics were prescribed, take them as directed. Finish them even if you start to feel better.  For the next few days, avoid physical activities that bring on chest pain. Continue physical activities as directed.  Do not use any tobacco products, including cigarettes, chewing tobacco, or electronic cigarettes.  Avoid drinking alcohol.  Only take medicine as directed by your health care provider.  Follow your health care provider's suggestions for further testing if your chest pain does not go away.  Keep any follow-up appointments you made. If you do not go to an appointment, you could develop lasting (chronic) problems with pain. If there is any problem keeping an appointment, call to  reschedule. SEEK MEDICAL CARE IF:   Your chest pain does not go away, even after treatment.  You have a rash with blisters on your chest.  You have a fever. SEEK IMMEDIATE MEDICAL CARE IF:   You have increased chest pain or pain that spreads to your arm, neck, jaw, back, or  abdomen.  You have shortness of breath.  You have an increasing cough, or you cough up blood.  You have severe back or abdominal pain.  You feel nauseous or vomit.  You have severe weakness.  You faint.  You have chills. This is an emergency. Do not wait to see if the pain will go away. Get medical help at once. Call your local emergency services (911 in [place] Do not drive yourself to the hospital. MAKE SURE YOU:   Understand these instructions.  Will watch your condition.  Will get help right away if you are not doing well or get worse. Document Released: 06/20/2005 Document Revised: 09/15/2013 Document Reviewed: 04/15/2008 [facility] Patient Information 2015 [place], [place]. This information is not intended to replace advice given to you by your health care provider. Make sure you discuss any questions you have with your health care provider.

## 2014-08-22 NOTE — ED Notes (Signed)
Pt reports onset this am 0300 of sharp pains to his back between his shoulder blades, pain radiates around left side and into his chest. Pain increases with movement. Having n/v this am also. ekg done.

## 2014-08-22 NOTE — ED Provider Notes (Signed)
Assuming care of patient from Dr. Blinda LeatherwoodPollina. Patient in the ED for chest pain, atypical, but does have family hx of CAD. CT PE neg. Awaiting 2nd trop. If neg, pt to be discharged with Cards f/u.  Patient had no complains, no concerns from the nursing side. Will continue to monitor.   Derwood KaplanAnkit Braedan Meuth, MD 08/22/14 1622

## 2014-09-09 ENCOUNTER — Encounter: Payer: Self-pay | Admitting: Family Medicine

## 2014-09-09 ENCOUNTER — Ambulatory Visit (INDEPENDENT_AMBULATORY_CARE_PROVIDER_SITE_OTHER): Payer: 59 | Admitting: Family Medicine

## 2014-09-09 VITALS — BP 128/86 | Ht 73.0 in | Wt 228.0 lb

## 2014-09-09 DIAGNOSIS — E785 Hyperlipidemia, unspecified: Secondary | ICD-10-CM

## 2014-09-09 DIAGNOSIS — Z131 Encounter for screening for diabetes mellitus: Secondary | ICD-10-CM

## 2014-09-09 LAB — LIPID PANEL
CHOL/HDL RATIO: 10.2 ratio
CHOLESTEROL: 234 mg/dL — AB (ref 0–200)
HDL: 23 mg/dL — ABNORMAL LOW (ref >39)
LDL Cholesterol: 143 mg/dL — ABNORMAL HIGH (ref 0–99)
TRIGLYCERIDES: 339 mg/dL — AB (ref <150)
VLDL: 68 mg/dL — ABNORMAL HIGH (ref 0–40)

## 2014-09-09 LAB — GLUCOSE, RANDOM: Glucose, Bld: 86 mg/dL (ref 70–99)

## 2014-09-09 MED ORDER — ALPRAZOLAM 0.5 MG PO TABS: 0.5000 mg | ORAL_TABLET | Freq: Four times a day (QID) | ORAL | Status: DC | PRN

## 2014-09-09 MED ORDER — CITALOPRAM HYDROBROMIDE 20 MG PO TABS: 20.0000 mg | ORAL_TABLET | Freq: Every day | ORAL | Status: DC

## 2014-09-09 NOTE — Patient Instructions (Signed)
Smoking Cessation Quitting smoking is important to your health and has many advantages. However, it is not always easy to quit since nicotine is a very addictive drug. Oftentimes, people try 3 times or more before being able to quit. This document explains the best ways for you to prepare to quit smoking. Quitting takes hard work and a lot of effort, but you can do it. ADVANTAGES OF QUITTING SMOKING  You will live longer, feel better, and live better.  Your body will feel the impact of quitting smoking almost immediately.  Within 20 minutes, blood pressure decreases. Your pulse returns to its normal level.  After 8 hours, carbon monoxide levels in the blood return to normal. Your oxygen level increases.  After 24 hours, the chance of having a heart attack starts to decrease. Your breath, hair, and body stop smelling like smoke.  After 48 hours, damaged nerve endings begin to recover. Your sense of taste and smell improve.  After 72 hours, the body is virtually free of nicotine. Your bronchial tubes relax and breathing becomes easier.  After 2 to 12 weeks, lungs can hold more air. Exercise becomes easier and circulation improves.  The risk of having a heart attack, stroke, cancer, or lung disease is greatly reduced.  After 1 year, the risk of coronary heart disease is cut in half.  After 5 years, the risk of stroke falls to the same as a nonsmoker.  After 10 years, the risk of lung cancer is cut in half and the risk of other cancers decreases significantly.  After 15 years, the risk of coronary heart disease drops, usually to the level of a nonsmoker.  If you are pregnant, quitting smoking will improve your chances of having a healthy baby.  The people you live with, especially any children, will be healthier.  You will have extra money to spend on things other than cigarettes. QUESTIONS TO THINK ABOUT BEFORE ATTEMPTING TO QUIT You may want to talk about your answers with your  health care provider.  Why do you want to quit?  If you tried to quit in the past, what helped and what did not?  What will be the most difficult situations for you after you quit? How will you plan to handle them?  Who can help you through the tough times? Your family? Friends? A health care provider?  What pleasures do you get from smoking? What ways can you still get pleasure if you quit? Here are some questions to ask your health care provider:  How can you help me to be successful at quitting?  What medicine do you think would be best for me and how should I take it?  What should I do if I need more help?  What is smoking withdrawal like? How can I get information on withdrawal? GET READY  Set a quit date.  Change your environment by getting rid of all cigarettes, ashtrays, matches, and lighters in your home, car, or work. Do not let people smoke in your home.  Review your past attempts to quit. Think about what worked and what did not. GET SUPPORT AND ENCOURAGEMENT You have a better chance of being successful if you have help. You can get support in many ways.  Tell your family, friends, and coworkers that you are going to quit and need their support. Ask them not to smoke around you.  Get individual, group, or telephone counseling and support. Programs are available at local hospitals and health centers. Call   your local health department for information about programs in your area.  Spiritual beliefs and practices may help some smokers quit.  Download a "quit meter" on your computer to keep track of quit statistics, such as how long you have gone without smoking, cigarettes not smoked, and money saved.  Get a self-help book about quitting smoking and staying off tobacco. LEARN NEW SKILLS AND BEHAVIORS  Distract yourself from urges to smoke. Talk to someone, go for a walk, or occupy your time with a task.  Change your normal routine. Take a different route to work.  Drink tea instead of coffee. Eat breakfast in a different place.  Reduce your stress. Take a hot bath, exercise, or read a book.  Plan something enjoyable to do every day. Reward yourself for not smoking.  Explore interactive web-based programs that specialize in helping you quit. GET MEDICINE AND USE IT CORRECTLY Medicines can help you stop smoking and decrease the urge to smoke. Combining medicine with the above behavioral methods and support can greatly increase your chances of successfully quitting smoking.  Nicotine replacement therapy helps deliver nicotine to your body without the negative effects and risks of smoking. Nicotine replacement therapy includes nicotine gum, lozenges, inhalers, nasal sprays, and skin patches. Some may be available over-the-counter and others require a prescription.  Antidepressant medicine helps people abstain from smoking, but how this works is unknown. This medicine is available by prescription.  Nicotinic receptor partial agonist medicine simulates the effect of nicotine in your brain. This medicine is available by prescription. Ask your health care provider for advice about which medicines to use and how to use them based on your health history. Your health care provider will tell you what side effects to look out for if you choose to be on a medicine or therapy. Carefully read the information on the package. Do not use any other product containing nicotine while using a nicotine replacement product.  RELAPSE OR DIFFICULT SITUATIONS Most relapses occur within the first 3 months after quitting. Do not be discouraged if you start smoking again. Remember, most people try several times before finally quitting. You may have symptoms of withdrawal because your body is used to nicotine. You may crave cigarettes, be irritable, feel very hungry, cough often, get headaches, or have difficulty concentrating. The withdrawal symptoms are only temporary. They are strongest  when you first quit, but they will go away within 10-14 days. To reduce the chances of relapse, try to:  Avoid drinking alcohol. Drinking lowers your chances of successfully quitting.  Reduce the amount of caffeine you consume. Once you quit smoking, the amount of caffeine in your body increases and can give you symptoms, such as a rapid heartbeat, sweating, and anxiety.  Avoid smokers because they can make you want to smoke.  Do not let weight gain distract you. Many smokers will gain weight when they quit, usually less than 10 pounds. Eat a healthy diet and stay active. You can always lose the weight gained after you quit.  Find ways to improve your mood other than smoking. FOR MORE INFORMATION  www.smokefree.gov  Document Released: 09/04/2001 Document Revised: 01/25/2014 Document Reviewed: 12/20/2011 [employer] Patient Information 2015 [employer]. This information is not intended to replace advice given to you by your health care provider. Make sure you discuss any questions you have with your health care provider.  

## 2014-09-09 NOTE — Progress Notes (Signed)
   Subjective:    Patient ID: Bruce King, male    DOB: 02/27/1971, 43 y.o.   MRN: [id number]  HPI Patient is here today for a recheck on his right arm pain.  He was seen here on 11/18.  Still having pain, numbness from his right elbow, to his fingers.  Would like to discuss cholesterol meds, OTC.   15-20 minutes spent with patient discussing all of these issues please see the dictation below  Review of Systems  Constitutional: Negative for activity change, appetite change and fatigue.  HENT: Negative for congestion.   Respiratory: Negative for cough.   Cardiovascular: Negative for chest pain.  Gastrointestinal: Negative for abdominal pain.  Endocrine: Negative for polydipsia and polyphagia.  Neurological: Negative for weakness.  Psychiatric/Behavioral: Negative for confusion.       Objective:   Physical Exam  Constitutional: He appears well-nourished. No distress.  Cardiovascular: Normal rate, regular rhythm and normal heart sounds.   No murmur heard. Pulmonary/Chest: Effort normal and breath sounds normal. No respiratory distress.  Musculoskeletal: He exhibits no edema.  Lymphadenopathy:    He has no cervical adenopathy.  Neurological: He is alert.  Psychiatric: His behavior is normal.  Vitals reviewed.         Assessment & Plan:  #1 right arm neuralgia he has paresthesias that go into the hand causing some numbness he is not having any weakness. I believe it is important for this patient to notify us if he starts having increased numbness, the re-onset of pain, or increased problems. I did offer MRI patient does not one to 2 I currently I did warn him that if he starts having weakness increased pain or other symptoms he must do the MRI right away cause he could potentially lose strength in that arm permanently  #2 he was counseled at length to quit smoking.  #3 he has tried numerous statins in could not tolerate them he states she will try red rice yeast  extract. He will check lipid profile currently and repeated again in 3 months  #4 recently had atypical chest pain went to the ER was diagnosed with atypical chest pain I told him he does need to exercise on a regular basis if he experiences any chest tightness pressure pain or shortness of breath with that he needs to immediately let us now

## 2014-09-10 ENCOUNTER — Telehealth: Payer: Self-pay | Admitting: Family Medicine

## 2014-09-10 ENCOUNTER — Encounter: Payer: Self-pay | Admitting: Family Medicine

## 2014-09-10 DIAGNOSIS — R079 Chest pain, unspecified: Secondary | ICD-10-CM

## 2014-09-10 NOTE — Telephone Encounter (Signed)
Notified patient his cholesterol profile is such with high LDL and low HDL risk cardio risk is high. Given recent chest pain and his risk factors including smoking and family hx the doctor highly recommends referral to cardio for evaluation and stress testing. Patient verbalized understanding and agreed. Referral initiated in the system.

## 2014-09-10 NOTE — Telephone Encounter (Signed)
Please talk with pt bcz his cholesterol mprofile is such with high LDL and low HDL hisk cardio risk is high. Given recent chest pain and his risk factors including smoking and fam hx I highly recommend referral to cardio for eval and stress testing. plz discuss and set up referral

## 2014-09-10 NOTE — Addendum Note (Signed)
Addended by: Dereck LigasJOHNSON, Rosalynd Mcwright P on: 09/10/2014 08:50 AM   Modules accepted: Orders

## 2014-09-28 ENCOUNTER — Ambulatory Visit (INDEPENDENT_AMBULATORY_CARE_PROVIDER_SITE_OTHER): Payer: Self-pay | Admitting: Cardiovascular Disease

## 2014-09-28 ENCOUNTER — Encounter: Payer: Self-pay | Admitting: Cardiovascular Disease

## 2014-09-28 VITALS — BP 118/84 | HR 85 | Ht 73.0 in | Wt 226.0 lb

## 2014-09-28 DIAGNOSIS — Z716 Tobacco abuse counseling: Secondary | ICD-10-CM

## 2014-09-28 DIAGNOSIS — Z8249 Family history of ischemic heart disease and other diseases of the circulatory system: Secondary | ICD-10-CM

## 2014-09-28 DIAGNOSIS — Z713 Dietary counseling and surveillance: Secondary | ICD-10-CM

## 2014-09-28 DIAGNOSIS — R079 Chest pain, unspecified: Secondary | ICD-10-CM

## 2014-09-28 DIAGNOSIS — Z7182 Exercise counseling: Secondary | ICD-10-CM

## 2014-09-28 DIAGNOSIS — Z719 Counseling, unspecified: Secondary | ICD-10-CM

## 2014-09-28 DIAGNOSIS — E785 Hyperlipidemia, unspecified: Secondary | ICD-10-CM

## 2014-09-28 NOTE — Progress Notes (Signed)
Patient ID: Bruce King, male   DOB: 01/26/1971, 44 y.o.   MRN: [id number]       CARDIOLOGY CONSULT NOTE  Patient ID: Bruce PastelFranklin D Saldierna MRN: [id number] DOB/AGE: 43/10/1970 43 y.o.  Admit date: (Not on file) Primary Physician Lilyan PuntLUKING,SCOTT, MD  Reason for Consultation: chest pain, multiple CV risk factors.  HPI: The patient is a 44 yr old male with a history of hyperlipidemia (intolerant to statin therapy) and tobacco use. He was evaluated in the ED on 08/22/14 for chest pain deemed atypical for a cardiac etiology. He also has a history of anxiety with agoraphobia and takes Celexa daily and Xanax prn. Most recent lipids on 09/09/14 demonstrated TC 234, TG 339, HDL 23, LDL 143. He has been trying red rice yeast extract since 12/18. His father had CAD at an early age (MI in 5540's, CABG in 5350's). ECG on 11/29 showed normal sinus rhythm with inferior and lateral q waves. CT angiography revealed a stable left lower lobe lung nodule. He has not experienced chest pain since that time. He denies orthopnea and PND as well as claudication pain.  He has modified his diet and has not eaten red meat. He has not been exercising. He continues to smoke 1 ppd and [place]. He denies limittations with activities of daily living.   Soc: He continues to smoke 1 ppd and [place]. Drinks alcohol on Friday nights.   Allergies  Allergen Reactions  . Lipitor [Atorvastatin] Other (See Comments)    Stiffness in joints  . Pravastatin     stiffness    Current Outpatient Prescriptions  Medication Sig Dispense Refill  . ALPRAZolam (XANAX) 0.5 MG tablet Take 1 tablet (0.5 mg total) by mouth 4 (four) times daily as needed. 120 tablet 1  . citalopram (CELEXA) 20 MG tablet Take 1 tablet (20 mg total) by mouth daily. 30 tablet 6  . ibuprofen (ADVIL,MOTRIN) 200 MG tablet Take 400 mg by mouth every 6 (six) hours as needed for mild pain or moderate pain.     No  current facility-administered medications for this visit.    Past Medical History  Diagnosis Date  . GAD (generalized anxiety disorder)   . Hyperlipidemia     No past surgical history on file.  History   Social History  . Marital Status: Single    Spouse Name: N/A    Number of Children: N/A  . Years of Education: N/A   Occupational History  . Not on file.   Social History Main Topics  . Smoking status: Current Every Day Smoker -- 1.00 packs/day    Types: Cigarettes    Start date: 09/28/1989  . Smokeless tobacco: Former NeurosurgeonUser    Types: Chew  . Alcohol Use: No  . Drug Use: No  . Sexual Activity: Not on file   Other Topics Concern  . Not on file   Social History Narrative       Prior to Admission medications   Medication Sig Start Date End Date Taking? Authorizing Provider  ALPRAZolam Prudy Feeler(XANAX) 0.5 MG tablet Take 1 tablet (0.5 mg total) by mouth 4 (four) times daily as needed. 09/09/14   Babs SciaraScott A Luking, MD  citalopram (CELEXA) 20 MG tablet Take 1 tablet (20 mg total) by mouth daily. 09/09/14   Babs SciaraScott A Luking, MD  ibuprofen (ADVIL,MOTRIN) 200 MG tablet Take 400 mg by mouth every 6 (six) hours as needed for mild pain or moderate pain.  Historical Provider, MD     Review of systems complete and found to be negative unless listed above in HPI     Physical exam Height  (1.854 m), weight 226 lb (102.513 kg). General: NAD Neck: No JVD, no thyromegaly or thyroid nodule.  Lungs: Clear to auscultation bilaterally with normal respiratory effort. CV: Nondisplaced PMI. Regular rate and rhythm, normal S1/S2, no S3/S4, no murmur.  No peripheral edema.  No carotid bruit.  Normal pedal pulses.  Abdomen: Soft, nontender, no hepatosplenomegaly, no distention.  Skin: Intact without lesions or rashes.  Neurologic: Alert and oriented x 3.  Psych: Normal affect. Extremities: No clubbing or cyanosis.  HEENT: Normal.   ECG: Most recent ECG reviewed.  Labs:   Lab Results   Component Value Date   WBC 13.5* 08/22/2014   HGB 15.9 08/22/2014   HCT 46.3 08/22/2014   MCV 90.6 08/22/2014   PLT 314 08/22/2014   No results for input(s): NA, K, CL, CO2, BUN, CREATININE, CALCIUM, PROT, BILITOT, ALKPHOS, ALT, AST, GLUCOSE in the last 168 hours.  Invalid input(s): LABALBU Lab Results  Component Value Date   CKTOTAL 120 03/09/2011   CKMB 1.3 03/09/2011   TROPONINI <0.30 08/22/2014    Lab Results  Component Value Date   CHOL 234* 09/09/2014   Lab Results  Component Value Date   HDL 23* 09/09/2014   Lab Results  Component Value Date   LDLCALC 143* 09/09/2014   Lab Results  Component Value Date   TRIG 339* 09/09/2014   Lab Results  Component Value Date   CHOLHDL 10.2 09/09/2014   No results found for: LDLDIRECT       Studies: No results found.  ASSESSMENT AND PLAN:  1. Chest pain: No recurrences. We had a lengthy discussion regarding modification of cardiovascular risk factors, including tobacco cessation, exercise, and further dietary modification. If he were to have a recurrence with symptoms different from GERD/hiatal hernia, I will obtain a GXT and would consider nuclear perfusion. 2. Hyperlipidemia: Taking red rice yeast extract. Intolerant to statin therapy. Due to have lipids checked in March. Could consider Zetia if uncontrolled. 3. Tobacco abuse: Cessation counseling provided.  Dispo: f/u prn.  Signed: Prentice Docker, M.D., F.A.C.C.  09/28/2014, 2:22 PM

## 2014-09-28 NOTE — Patient Instructions (Signed)
Your physician recommends that you schedule a follow-up appointment only as needed      Thank you for choosing [facility] !   

## 2014-12-06 ENCOUNTER — Other Ambulatory Visit: Payer: Self-pay | Admitting: *Deleted

## 2014-12-06 ENCOUNTER — Telehealth: Payer: Self-pay | Admitting: Family Medicine

## 2014-12-06 DIAGNOSIS — D72829 Elevated white blood cell count, unspecified: Secondary | ICD-10-CM

## 2014-12-06 DIAGNOSIS — Z79899 Other long term (current) drug therapy: Secondary | ICD-10-CM

## 2014-12-06 DIAGNOSIS — R739 Hyperglycemia, unspecified: Secondary | ICD-10-CM

## 2014-12-06 DIAGNOSIS — E785 Hyperlipidemia, unspecified: Secondary | ICD-10-CM

## 2014-12-06 NOTE — Telephone Encounter (Signed)
Pt is requesting lab orders for his appt on Wednesday.  Last labs were lipid and glucose on 12/17. Pt wants to be able to do these before  His appt. He would like them NOW.

## 2014-12-06 NOTE — Telephone Encounter (Signed)
bw orders given to pt.

## 2014-12-07 LAB — CBC WITH DIFFERENTIAL/PLATELET
BASOS: 0 %
Basophils Absolute: 0 10*3/uL (ref 0.0–0.2)
Eos: 4 %
Eosinophils Absolute: 0.4 10*3/uL (ref 0.0–0.4)
HCT: 48.4 % (ref 37.5–51.0)
HEMOGLOBIN: 16.6 g/dL (ref 12.6–17.7)
IMMATURE GRANS (ABS): 0 10*3/uL (ref 0.0–0.1)
IMMATURE GRANULOCYTES: 0 %
Lymphocytes Absolute: 2.7 10*3/uL (ref 0.7–3.1)
Lymphs: 27 %
MCH: 31 pg (ref 26.6–33.0)
MCHC: 34.3 g/dL (ref 31.5–35.7)
MCV: 90 fL (ref 79–97)
MONOCYTES: 8 %
Monocytes Absolute: 0.8 10*3/uL (ref 0.1–0.9)
NEUTROS PCT: 61 %
Neutrophils Absolute: 6.2 10*3/uL (ref 1.4–7.0)
PLATELETS: 378 10*3/uL (ref 150–379)
RBC: 5.36 x10E6/uL (ref 4.14–5.80)
RDW: 14.2 % (ref 12.3–15.4)
WBC: 10.2 10*3/uL (ref 3.4–10.8)

## 2014-12-07 LAB — LIPID PANEL
CHOL/HDL RATIO: 10.4 ratio — AB (ref 0.0–5.0)
CHOLESTEROL TOTAL: 228 mg/dL — AB (ref 100–199)
HDL: 22 mg/dL — ABNORMAL LOW (ref >39)
TRIGLYCERIDES: 501 mg/dL — AB (ref 0–149)

## 2014-12-07 LAB — GLUCOSE, RANDOM: GLUCOSE: 96 mg/dL (ref 65–99)

## 2014-12-07 LAB — HEPATIC FUNCTION PANEL
ALBUMIN: 4.5 g/dL (ref 3.5–5.5)
ALK PHOS: 128 IU/L — AB (ref 39–117)
ALT: 31 IU/L (ref 0–44)
AST: 23 IU/L (ref 0–40)
BILIRUBIN, DIRECT: 0.13 mg/dL (ref 0.00–0.40)
Bilirubin Total: 0.5 mg/dL (ref 0.0–1.2)
Total Protein: 7.6 g/dL (ref 6.0–8.5)

## 2014-12-08 ENCOUNTER — Encounter: Payer: Self-pay | Admitting: Family Medicine

## 2014-12-08 ENCOUNTER — Ambulatory Visit (INDEPENDENT_AMBULATORY_CARE_PROVIDER_SITE_OTHER): Payer: Self-pay | Admitting: Family Medicine

## 2014-12-08 VITALS — BP 126/86 | Ht 73.0 in | Wt 224.4 lb

## 2014-12-08 DIAGNOSIS — E781 Pure hyperglyceridemia: Secondary | ICD-10-CM

## 2014-12-08 DIAGNOSIS — F411 Generalized anxiety disorder: Secondary | ICD-10-CM

## 2014-12-08 DIAGNOSIS — E785 Hyperlipidemia, unspecified: Secondary | ICD-10-CM

## 2014-12-08 MED ORDER — ALPRAZOLAM 0.5 MG PO TABS: 0.5000 mg | ORAL_TABLET | Freq: Four times a day (QID) | ORAL | Status: DC | PRN

## 2014-12-08 NOTE — Patient Instructions (Signed)

## 2014-12-08 NOTE — Progress Notes (Signed)
   Subjective:    Patient ID: Bruce King, male    DOB: 10/11/1970, 44 y.o.   MRN: [id number]  Hyperlipidemia This is a chronic problem. The current episode started more than 1 year ago. The problem is uncontrolled. Recent lipid tests were reviewed and are high. Pertinent negatives include no chest pain. Treatments tried: red rice yeast extract. The current treatment provides mild improvement of lipids. There are no compliance problems.    Patient arrives for a follow up on cholesterol and anxiety. No problems or concerns Talk with patient about his anxiety he takes his Xanax anywhere from 3-4 times per day he states he keeps his nerves: He finds that he gets fearful a lot anxious a lot. I cautioned him regarding alcohol use.   Review of Systems  Constitutional: Negative for activity change, appetite change and fatigue.  HENT: Negative for congestion.   Respiratory: Negative for cough.   Cardiovascular: Negative for chest pain.  Gastrointestinal: Negative for abdominal pain.  Endocrine: Negative for polydipsia and polyphagia.  Neurological: Negative for weakness.  Psychiatric/Behavioral: Negative for confusion.       Objective:   Physical Exam  Constitutional: He appears well-nourished. No distress.  Cardiovascular: Normal rate, regular rhythm and normal heart sounds.   No murmur heard. Pulmonary/Chest: Effort normal and breath sounds normal. No respiratory distress.  Musculoskeletal: He exhibits no edema.  Lymphadenopathy:    He has no cervical adenopathy.  Neurological: He is alert.  Psychiatric: His behavior is normal.  Vitals reviewed.         Assessment & Plan:  Elevated triglycerides way too high patient was encouraged to cut down on alcohol use. Watch diet eliminate regular sodas from his diet recheck this again in 4 weeks. If it still elevated in 4 weeks start gemfibrozil  Anxiety stable needs refill on medicine Xanax given with 5 refills. He knows not to  combine the Xanax when he drinks beer on some evenings. Patient denies being alcoholic. Patient denies being depressed. Not suicidal.  Patient to follow-up in 6 months lab work as per above in 4 weeks

## 2014-12-09 ENCOUNTER — Ambulatory Visit: Payer: Self-pay | Admitting: Family Medicine

## 2015-02-23 LAB — LIPID PANEL
CHOLESTEROL TOTAL: 224 mg/dL — AB (ref 100–199)
Chol/HDL Ratio: 9.7 ratio units — ABNORMAL HIGH (ref 0.0–5.0)
HDL: 23 mg/dL — AB (ref >39)
LDL Calculated: 124 mg/dL — ABNORMAL HIGH (ref 0–99)
Triglycerides: 386 mg/dL — ABNORMAL HIGH (ref 0–149)
VLDL Cholesterol Cal: 77 mg/dL — ABNORMAL HIGH (ref 5–40)

## 2015-05-09 ENCOUNTER — Other Ambulatory Visit: Payer: Self-pay | Admitting: Family Medicine

## 2015-06-07 ENCOUNTER — Other Ambulatory Visit: Payer: Self-pay | Admitting: Family Medicine

## 2015-06-24 ENCOUNTER — Other Ambulatory Visit: Payer: Self-pay | Admitting: Family Medicine

## 2015-06-24 NOTE — Telephone Encounter (Signed)
One mo worth due his 6 mo per scott

## 2015-07-08 ENCOUNTER — Other Ambulatory Visit: Payer: Self-pay | Admitting: Family Medicine

## 2015-07-08 NOTE — Telephone Encounter (Signed)
This +1 refill send card patient needs office visit

## 2015-07-18 ENCOUNTER — Encounter: Payer: Self-pay | Admitting: Family Medicine

## 2015-07-18 ENCOUNTER — Ambulatory Visit (INDEPENDENT_AMBULATORY_CARE_PROVIDER_SITE_OTHER): Payer: Self-pay | Admitting: Family Medicine

## 2015-07-18 VITALS — Ht 73.0 in | Wt 224.4 lb

## 2015-07-18 DIAGNOSIS — R7301 Impaired fasting glucose: Secondary | ICD-10-CM

## 2015-07-18 DIAGNOSIS — E785 Hyperlipidemia, unspecified: Secondary | ICD-10-CM

## 2015-07-18 DIAGNOSIS — F411 Generalized anxiety disorder: Secondary | ICD-10-CM

## 2015-07-18 MED ORDER — ALPRAZOLAM 0.5 MG PO TABS: ORAL_TABLET | ORAL | Status: DC

## 2015-07-18 MED ORDER — CITALOPRAM HYDROBROMIDE 10 MG PO TABS: 10.0000 mg | ORAL_TABLET | Freq: Every day | ORAL | Status: DC

## 2015-07-18 NOTE — Progress Notes (Signed)
   Subjective:    Patient ID: Bruce King, male    DOB: 08/03/1971, 44 y.o.   MRN: [id number]  HPI This patient comes in today as follow-up. History hyperlipidemia. He is taking red rice and also flaxseed he is try to watch his diet. Denies any problem with cholesterol other than what history states  Chronic anxiety states citalopram helps but it does reduce sex drive he would like to reduce the medication. In addition to this Xanax he takes 4 times per day. He denies abusing it.  Patient does smoke he knows he needs to quit he's been counseled on this  Patient states blood pressure at home is always good   Review of Systems    denies chest tightness pressure pain shortness of breath Objective:   Physical Exam Lungs clear heart regular pulse normal blood pressure 138/94       Assessment & Plan:  Patient states blood pressure goes up in the office he states always good at home  Hyperlipidemia check lipid profile  Chronic anxiety Xanax renewed, Celexa reduce to 10 mg  Follow-up 6 months

## 2015-07-19 LAB — LIPID PANEL
CHOLESTEROL TOTAL: 229 mg/dL — AB (ref 100–199)
Chol/HDL Ratio: 10.4 ratio units — ABNORMAL HIGH (ref 0.0–5.0)
HDL: 22 mg/dL — ABNORMAL LOW (ref >39)
LDL Calculated: 128 mg/dL — ABNORMAL HIGH (ref 0–99)
Triglycerides: 393 mg/dL — ABNORMAL HIGH (ref 0–149)
VLDL Cholesterol Cal: 79 mg/dL — ABNORMAL HIGH (ref 5–40)

## 2015-07-19 LAB — GLUCOSE, RANDOM: Glucose: 84 mg/dL (ref 65–99)

## 2015-11-07 IMAGING — CT CT ANGIO CHEST
2 of 9 series · 19 of 46 positions shown · IV contrast (Omni 300)
Comparison: Chest x-ray dated 03/09/2011

CLINICAL DATA: Onset of pleuritic chest pain this afternoon.

EXAM:
CT ANGIOGRAPHY CHEST WITH CONTRAST
TECHNIQUE: Multidetector CT imaging of the chest was performed using the
standard protocol during bolus administration of intravenous
contrast. Multiplanar CT image reconstructions and MIPs were
obtained to evaluate the vascular anatomy.
CONTRAST:  80mL OMNIPAQUE IOHEXOL 350 MG/ML SOLN

[Series 5: thins · axial · 0.65mm/px · z∈[-105,+138]mm · 16 of 275 slices shown]
[im 16/275  lung]
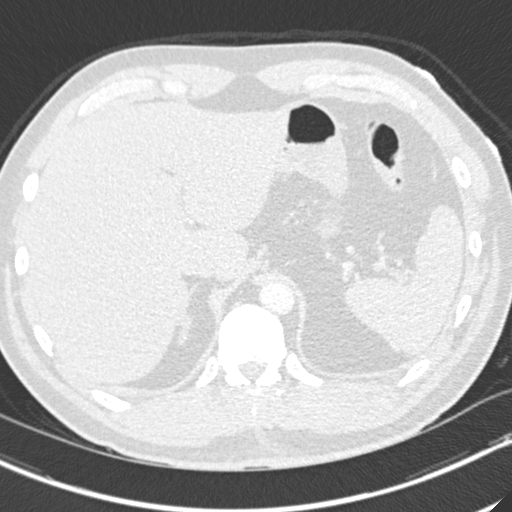
[im 31/275  soft-tissue]
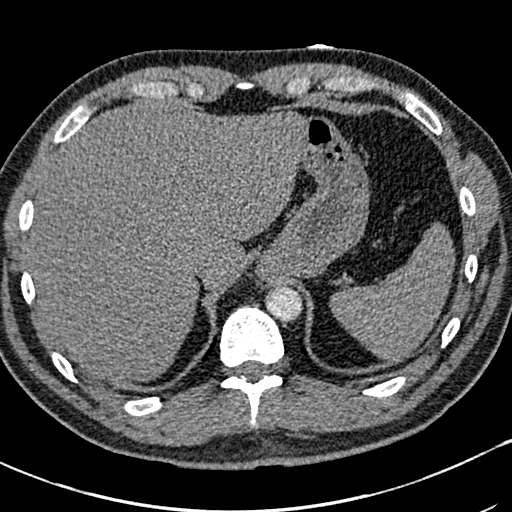
[im 46/275  lung]
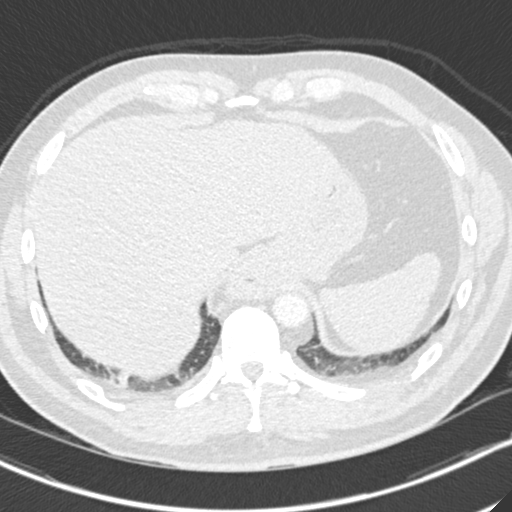
[im 61/275  soft-tissue]
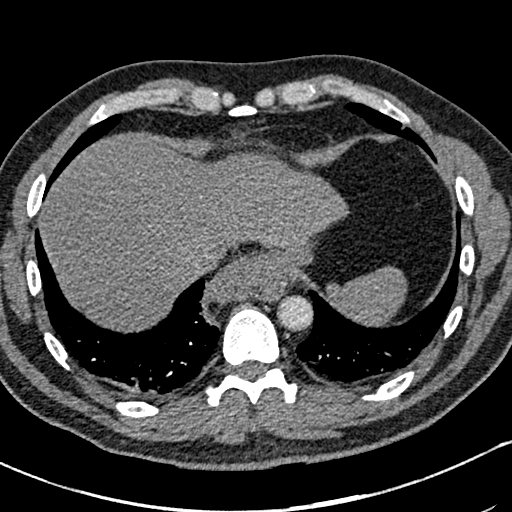
[im 77/275  lung]
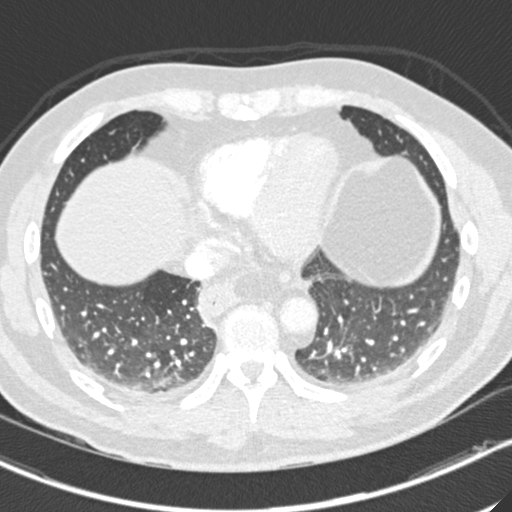
[im 92/275  soft-tissue]
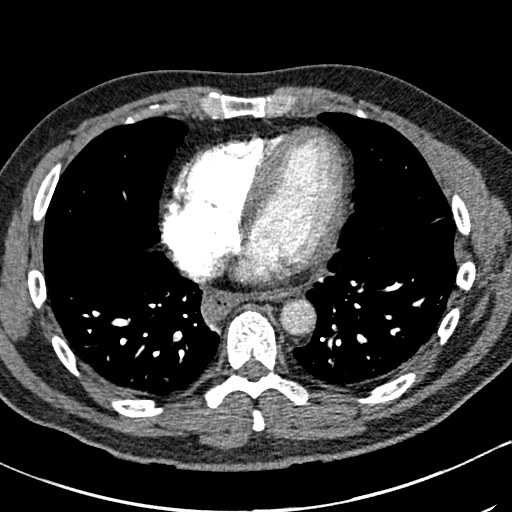
[im 107/275  lung]
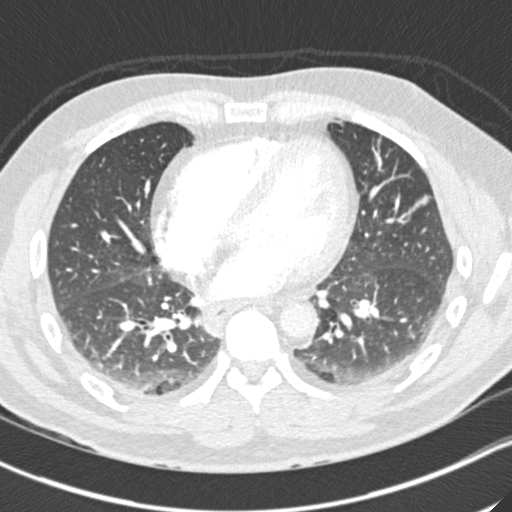
[im 122/275  soft-tissue]
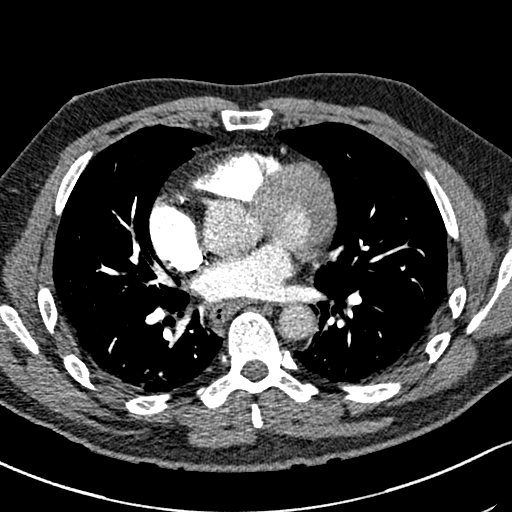
[im 153/275  lung]
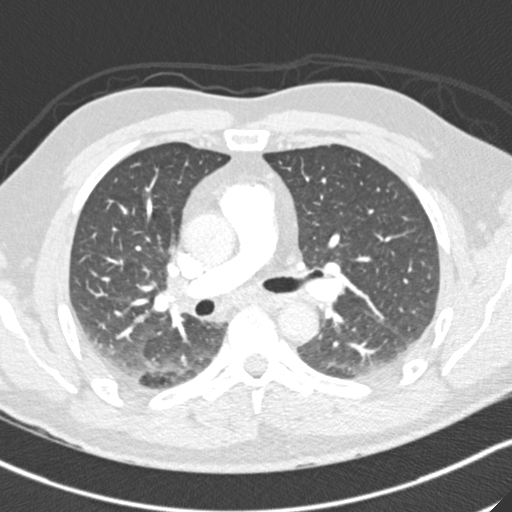
[im 168/275  soft-tissue]
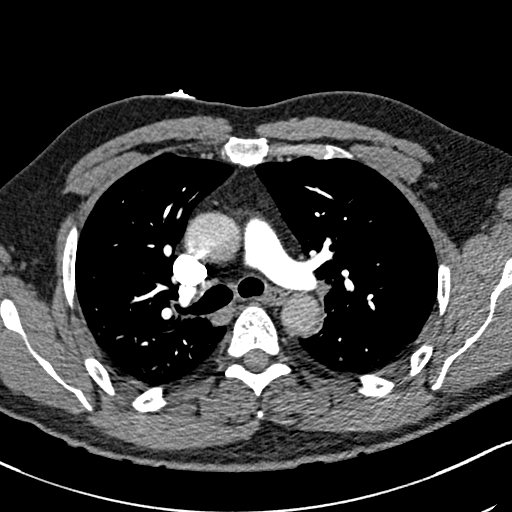
[im 183/275  lung]
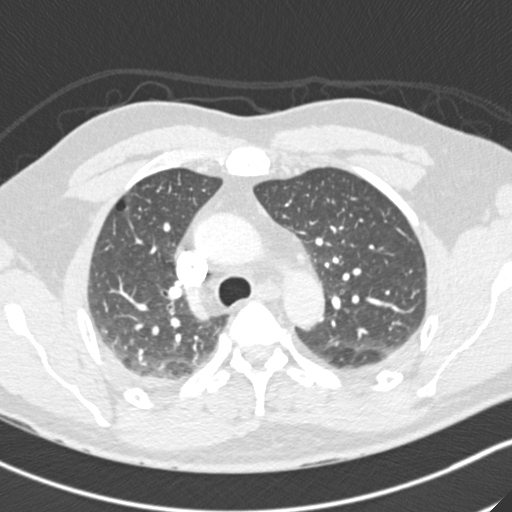
[im 198/275  soft-tissue]
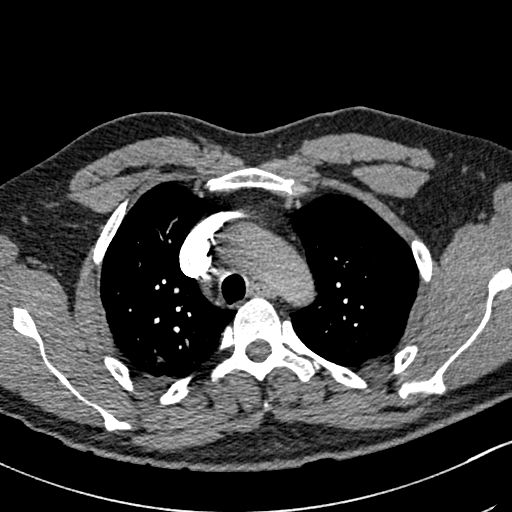
[im 214/275  lung]
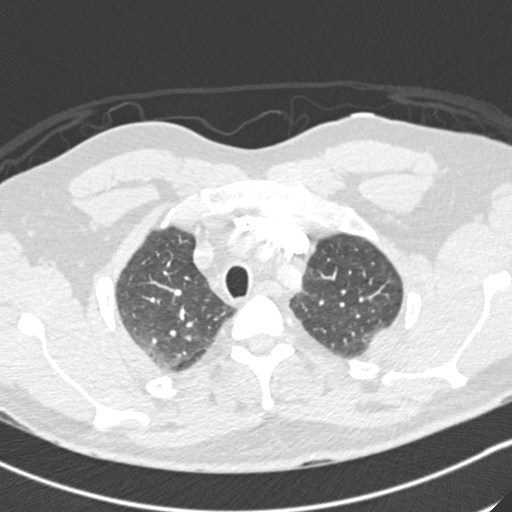
[im 229/275  soft-tissue]
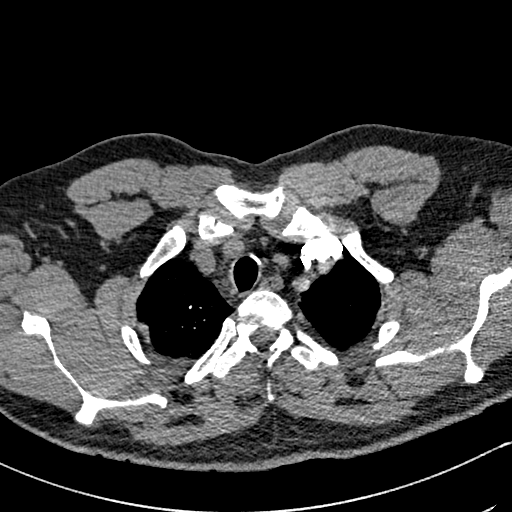
[im 244/275  lung]
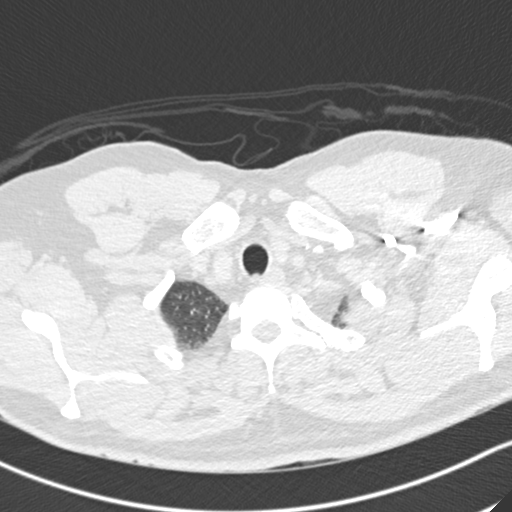
[im 259/275  soft-tissue]
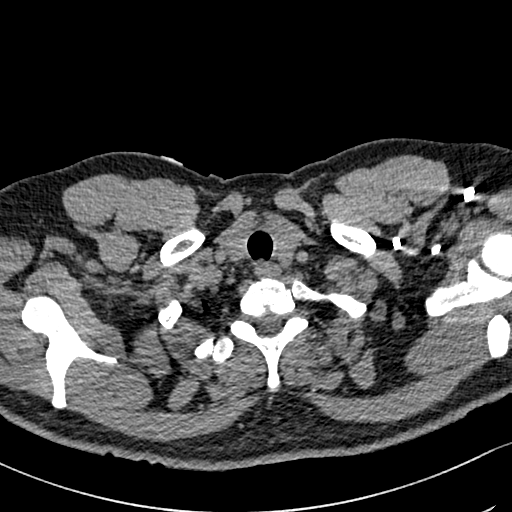

[Series 7: coronal mpr · coronal · 0.59mm/px · 3 of 124 slices shown]
[im 31/124  soft-tissue]
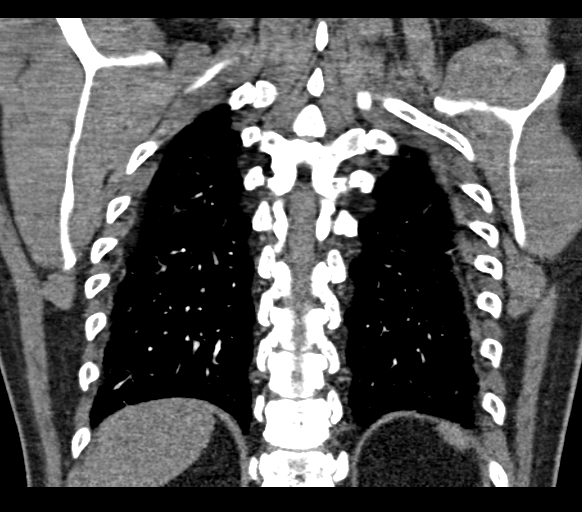
[im 62/124  soft-tissue]
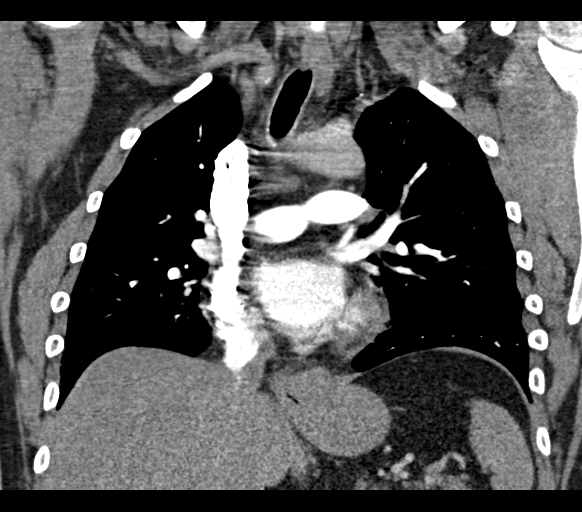
[im 93/124  soft-tissue]
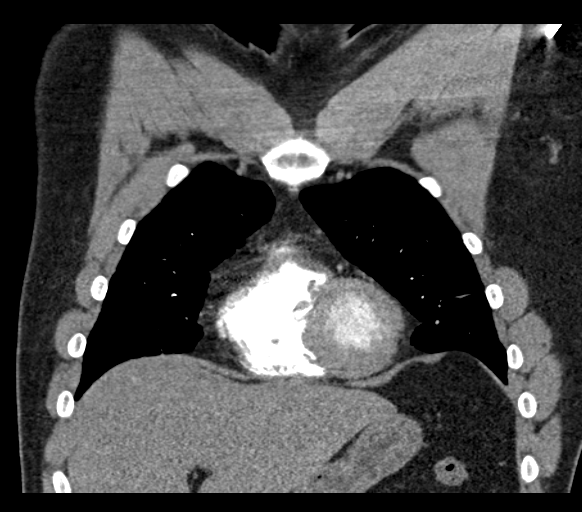

[19 of 46 positions shown; findings below may reference images not displayed]

FINDINGS: There is a 5 cm hiatal hernia. There are no infiltrates or
effusions. There is slight linear scarring at both lung bases and in
the lingula, stable since the prior chest x-ray. Slight dependent
atelectasis posteriorly.

There is a 4 mm nodule at the left lung base anteriorly. This
appears unchanged since the prior chest x-ray. No osseous
abnormality. No effusions.

Review of the MIP images confirms the above findings.
IMPRESSION: No acute abnormalities. No pulmonary emboli. Stable 4 mm nodule at
the left lung base. No follow-up is recommended.

## 2015-12-19 ENCOUNTER — Encounter: Payer: Self-pay | Admitting: Family Medicine

## 2015-12-19 ENCOUNTER — Ambulatory Visit (INDEPENDENT_AMBULATORY_CARE_PROVIDER_SITE_OTHER): Payer: Self-pay | Admitting: Family Medicine

## 2015-12-19 VITALS — BP 126/80 | Temp 98.6°F | Ht 73.0 in | Wt 216.4 lb

## 2015-12-19 DIAGNOSIS — J111 Influenza due to unidentified influenza virus with other respiratory manifestations: Secondary | ICD-10-CM

## 2015-12-19 DIAGNOSIS — J209 Acute bronchitis, unspecified: Secondary | ICD-10-CM

## 2015-12-19 MED ORDER — SULFAMETHOXAZOLE-TRIMETHOPRIM 800-160 MG PO TABS: 1.0000 | ORAL_TABLET | Freq: Two times a day (BID) | ORAL | Status: DC

## 2015-12-19 MED ORDER — CITALOPRAM HYDROBROMIDE 20 MG PO TABS: 10.0000 mg | ORAL_TABLET | Freq: Every day | ORAL | Status: DC

## 2015-12-19 NOTE — Progress Notes (Signed)
   Subjective:    Patient ID: Bruce King, male    DOB: 11/23/1970, 45 y.o.   MRN: [id number]  URI  This is a new problem. The current episode started yesterday. The problem has been unchanged. There has been no fever. Associated symptoms include congestion, coughing, headaches and rhinorrhea. Pertinent negatives include no chest pain, ear pain or wheezing. Associated symptoms comments: Shortness of breath, chest tightness. He has tried decongestant for the symptoms. The treatment provided no relief.  Patient with dry cough for the past week but over the past couple days fever chills cough congestion body aches headaches. No nausea vomiting diarrhea    Review of Systems  Constitutional: Negative for fever and activity change.  HENT: Positive for congestion and rhinorrhea. Negative for ear pain.   Eyes: Negative for discharge.  Respiratory: Positive for cough. Negative for wheezing.   Cardiovascular: Negative for chest pain.  Neurological: Positive for headaches.       Objective:   Physical Exam  Constitutional: He appears well-developed.  HENT:  Head: Normocephalic.  Mouth/Throat: Oropharynx is clear and moist. No oropharyngeal exudate.  Neck: Normal range of motion.  Cardiovascular: Normal rate, regular rhythm and normal heart sounds.   No murmur heard. Pulmonary/Chest: Effort normal and breath sounds normal. He has no wheezes.  Lymphadenopathy:    He has no cervical adenopathy.  Neurological: He exhibits normal muscle tone.  Skin: Skin is warm and dry.  Nursing note and vitals reviewed.   Lungs are clear no sign and pneumonia on today's exam patient does not appear toxic O2 sat 96% patient was counseled to quit smoking At the patient's request Celexa was moved to 20 mg daily plus also patient is to follow-up with us for his regular appointment coming up     Assessment & Plan:  Influenza-the patient was diagnosed with influenza. Patient/family educated about the flu  and warning signs to watch for. If difficulty breathing, severe neck pain and stiffness, cyanosis, disorientation, or progressive worsening then immediately get rechecked at that ER. If progressive symptoms be certain to be rechecked. Supportive measures such as Tylenol/ibuprofen was discussed. No aspirin use in children. And influenza home care instruction sheet was given. I believe the patient has secondary rhinosinusitis/bronchitis I do not feel the patient has pneumonia warning signs were discussed antibiotics prescribed I do not believe Tamiflu would do this patient any good

## 2015-12-19 NOTE — Patient Instructions (Signed)

## 2016-01-02 ENCOUNTER — Ambulatory Visit (INDEPENDENT_AMBULATORY_CARE_PROVIDER_SITE_OTHER): Payer: Self-pay | Admitting: Family Medicine

## 2016-01-02 ENCOUNTER — Encounter: Payer: Self-pay | Admitting: Family Medicine

## 2016-01-02 VITALS — BP 120/86 | Temp 98.5°F | Ht 73.0 in | Wt 215.0 lb

## 2016-01-02 DIAGNOSIS — E785 Hyperlipidemia, unspecified: Secondary | ICD-10-CM

## 2016-01-02 DIAGNOSIS — F411 Generalized anxiety disorder: Secondary | ICD-10-CM

## 2016-01-02 DIAGNOSIS — J209 Acute bronchitis, unspecified: Secondary | ICD-10-CM

## 2016-01-02 DIAGNOSIS — Z79899 Other long term (current) drug therapy: Secondary | ICD-10-CM

## 2016-01-02 MED ORDER — ALPRAZOLAM 0.5 MG PO TABS: ORAL_TABLET | ORAL | Status: DC

## 2016-01-02 MED ORDER — CITALOPRAM HYDROBROMIDE 20 MG PO TABS: 20.0000 mg | ORAL_TABLET | Freq: Every day | ORAL | Status: DC

## 2016-01-02 MED ORDER — DOXYCYCLINE HYCLATE 100 MG PO CAPS: 100.0000 mg | ORAL_CAPSULE | Freq: Two times a day (BID) | ORAL | Status: DC

## 2016-01-02 NOTE — Progress Notes (Signed)
   Subjective:    Patient ID: Bruce King, male    DOB: 06/01/1971, 45 y.o.   MRN: [id number]  Cough This is a new problem. The current episode started 1 to 4 weeks ago. The cough is non-productive. Associated symptoms include headaches and rhinorrhea. Pertinent negatives include no chest pain, ear pain, fever, shortness of breath or wheezing. Treatments tried: Tylenol.   Patient states no other concerns this visit. Difficulty focusing Some chest congestion Some sinus ache Energy low  no sweats Patient does state he needs get his cholesterol rechecked. Also needs renewal of his anxiety medications states it seems to be doing well.   Review of Systems  Constitutional: Negative for fever and activity change.  HENT: Positive for congestion and rhinorrhea. Negative for ear pain.   Eyes: Negative for discharge.  Respiratory: Positive for cough. Negative for shortness of breath, wheezing and stridor.   Cardiovascular: Negative for chest pain.  Neurological: Positive for headaches.       Objective:   Physical Exam  Lungs clear hearts regular pulse normal abdomen soft extremities no edema    Assessment & Plan:  Mild anxiety and depression related issues patient denies being depressed mainly just anxious will renew his Celexa and Xanax. For some reason he was only getting 10 mg needs to be on 20 mg Celexa Rhonchi this antibiotic prescribed warning signs discussed follow-up if problems X-rays lab work not indicated Patient will be getting cholesterol profile may need to be back on medication he is willing to try a statin again

## 2016-01-16 ENCOUNTER — Ambulatory Visit: Payer: Self-pay | Admitting: Family Medicine

## 2016-02-03 LAB — LIPID PANEL
CHOLESTEROL TOTAL: 217 mg/dL — AB (ref 100–199)
Chol/HDL Ratio: 9.4 ratio units — ABNORMAL HIGH (ref 0.0–5.0)
HDL: 23 mg/dL — ABNORMAL LOW (ref >39)
LDL CALC: 133 mg/dL — AB (ref 0–99)
Triglycerides: 307 mg/dL — ABNORMAL HIGH (ref 0–149)
VLDL CHOLESTEROL CAL: 61 mg/dL — AB (ref 5–40)

## 2016-02-03 LAB — HEPATIC FUNCTION PANEL
ALBUMIN: 4.3 g/dL (ref 3.5–5.5)
ALT: 32 IU/L (ref 0–44)
AST: 24 IU/L (ref 0–40)
Alkaline Phosphatase: 126 IU/L — ABNORMAL HIGH (ref 39–117)
Bilirubin Total: 0.3 mg/dL (ref 0.0–1.2)
Bilirubin, Direct: 0.09 mg/dL (ref 0.00–0.40)
Total Protein: 7.2 g/dL (ref 6.0–8.5)

## 2016-02-06 ENCOUNTER — Telehealth: Payer: Self-pay | Admitting: *Deleted

## 2016-02-06 MED ORDER — ATORVASTATIN CALCIUM 10 MG PO TABS: 10.0000 mg | ORAL_TABLET | Freq: Every day | ORAL | Status: DC

## 2016-02-06 NOTE — Telephone Encounter (Signed)
Rx sent electronically to pharmacy. Patient notified and will repeat blood work in 10-12 weeks.

## 2016-02-06 NOTE — Addendum Note (Signed)
Addended by: Margaretha SheffieldBROWN, AUTUMN S on: 02/06/2016 03:30 PM   Modules accepted: Orders

## 2016-02-06 NOTE — Addendum Note (Signed)
Addended by: Margaretha SheffieldBROWN, Adalid Beckmann S on: 02/06/2016 11:10 AM   Modules accepted: Orders

## 2016-02-06 NOTE — Telephone Encounter (Signed)
Notes Recorded by Margaretha SheffieldAutumn S Cruzita Lipa, RN on 02/06/2016 at 11:19 AM Results discussed with patient. His Lipid profile still rates him at a high risk for heart disease over the next 10 years. His risk is 15% which is higher than the acceptable 7% or lower. It is recommended to be on a statin medication. Unfortunately he has had trouble in the past with statins including Lipitor and pravastatin. We can try low-dose Crestor as the next step if he is willing. Crestor is a statin medicine in there is a possibility that he may not be able to tolerate it. Typically though we start off with a low-dose and gradually move up depending on the patient tolerating it. Please discuss with patient regarding his wishes Dr Lorin PicketScott would recommend Crestor 5 mg daily he can gradually titrate up on the dose over the next few weeks. If he tolerates it stick with it. If he does not he is to let us know. Also we recommend to avoid smoking. And repeat lipid liver profile in approximately 10-12 weeks. Patient verbalized understanding and states he would prefer to retry the Lipitor. Patient states he not sure if his joint pain was from the lipitor or his pinched nerve so he would like to give it another try. Patient will pick up med from his pharmacy tomorrow pm. Blood work ordered in [facility] Notes Recorded by Babs SciaraScott A Luking, MD on 02/06/2016 at 9:00 AM Nurse's-please communicate with patient. His number profile still rates him at a high risk for heart disease over the next 10 years. His risk is 15% which is higher than the acceptable 7% or lower. It is recommended to be on a statin medication. Unfortunately he has had trouble in the past with statins including Lipitor and pravastatin. We can try low-dose Crestor as the next step if he is willing. Crestor is a statin medicine in there is a possibility that he may not be able to tolerate it. Typically though we start off with a low-dose and gradually move up depending on the patient tolerating it.  Please discuss with patient regarding his wishes. I would recommend Crestor 5 mg daily he can gradually titrate up on the dose over the next few weeks. If he tolerates it stick with it. If he does not he is to let us know. Also we recommend to avoid smoking. And repeat lipid liver profile in approximately 10-12 weeks. Thanks

## 2016-02-06 NOTE — Telephone Encounter (Signed)
i rec to start at lipitor 10 mg one qd #30,5 rf, rechcek labs in 10 to 12 weeks we may need to aadjust the dose

## 2016-06-14 ENCOUNTER — Other Ambulatory Visit: Payer: Self-pay | Admitting: Family Medicine

## 2016-07-13 ENCOUNTER — Other Ambulatory Visit: Payer: Self-pay | Admitting: Family Medicine

## 2016-07-13 ENCOUNTER — Other Ambulatory Visit: Payer: Self-pay | Admitting: *Deleted

## 2016-07-13 MED ORDER — ALPRAZOLAM 0.5 MG PO TABS: ORAL_TABLET | ORAL | 0 refills | Status: DC

## 2016-07-13 NOTE — Telephone Encounter (Signed)
30 day-send the patient a folded postcard or phone call to let him know he needs office visit within 30 day

## 2016-07-24 ENCOUNTER — Encounter: Payer: Self-pay | Admitting: Family Medicine

## 2016-07-24 ENCOUNTER — Ambulatory Visit (INDEPENDENT_AMBULATORY_CARE_PROVIDER_SITE_OTHER): Payer: Self-pay | Admitting: Family Medicine

## 2016-07-24 VITALS — BP 140/90 | Ht 73.0 in | Wt 221.0 lb

## 2016-07-24 DIAGNOSIS — Z131 Encounter for screening for diabetes mellitus: Secondary | ICD-10-CM

## 2016-07-24 DIAGNOSIS — E7849 Other hyperlipidemia: Secondary | ICD-10-CM

## 2016-07-24 DIAGNOSIS — E784 Other hyperlipidemia: Secondary | ICD-10-CM

## 2016-07-24 DIAGNOSIS — F411 Generalized anxiety disorder: Secondary | ICD-10-CM

## 2016-07-24 MED ORDER — ALPRAZOLAM 0.5 MG PO TABS: ORAL_TABLET | ORAL | 5 refills | Status: DC

## 2016-07-24 MED ORDER — CITALOPRAM HYDROBROMIDE 20 MG PO TABS: 20.0000 mg | ORAL_TABLET | Freq: Every day | ORAL | 12 refills | Status: DC

## 2016-07-24 MED ORDER — SERTRALINE HCL 100 MG PO TABS: 100.0000 mg | ORAL_TABLET | Freq: Every day | ORAL | 3 refills | Status: DC

## 2016-07-24 MED ORDER — BUPROPION HCL ER (SR) 150 MG PO TB12: 150.0000 mg | ORAL_TABLET | Freq: Two times a day (BID) | ORAL | 2 refills | Status: DC

## 2016-07-24 NOTE — Progress Notes (Signed)
   Subjective:    Patient ID: Bruce King, male    DOB: 11/02/1970, 45 y.o.   MRN: [id number]  Hyperlipidemia  This is a chronic problem. The current episode started more than 1 year ago. Pertinent negatives include no chest pain or shortness of breath. Current antihyperlipidemic treatment includes diet change (had to stop LIpitor on Own due to joint pain).  This patient does state he's taken his Celexa and Xanax. Stress levels are fair. Is not had much panic attacks lately. If he does not take his medicines of anxiety levels go up.  He does smoke he knows he needs to quit we talked at length about doing so to reduce his risk of heart attacks  Patient states he's been unable to tolerate Lipitor because of joint pains and stiffness and does not one of be on a statin  Review of Systems  Constitutional: Negative for activity change, fatigue and fever.  Respiratory: Negative for cough and shortness of breath.   Cardiovascular: Negative for chest pain and leg swelling.  Neurological: Negative for headaches.       Objective:   Physical Exam  Constitutional: He appears well-nourished. No distress.  Cardiovascular: Normal rate, regular rhythm and normal heart sounds.   No murmur heard. Pulmonary/Chest: Effort normal and breath sounds normal. No respiratory distress.  Musculoskeletal: He exhibits no edema.  Lymphadenopathy:    He has no cervical adenopathy.  Neurological: He is alert.  Psychiatric: His behavior is normal.  Vitals reviewed.         Assessment & Plan:  Hyperlipidemia-importance of keeping his cholesterol under control was stressed. Patient cannot tolerate statins. He will use red rice yeast extract and watch his diet  He was counseled on quitting smoking he once to try Wellbutrin we had to change his medicine from Celexa to Zoloft in order to keep the Wellbutrin within reason as far as potential for side effects and interactions.

## 2016-10-30 ENCOUNTER — Emergency Department (HOSPITAL_COMMUNITY): Payer: Self-pay

## 2016-10-30 ENCOUNTER — Encounter (HOSPITAL_COMMUNITY): Payer: Self-pay

## 2016-10-30 DIAGNOSIS — F17210 Nicotine dependence, cigarettes, uncomplicated: Secondary | ICD-10-CM | POA: Diagnosis present

## 2016-10-30 DIAGNOSIS — I214 Non-ST elevation (NSTEMI) myocardial infarction: Principal | ICD-10-CM

## 2016-10-30 DIAGNOSIS — Z79899 Other long term (current) drug therapy: Secondary | ICD-10-CM

## 2016-10-30 DIAGNOSIS — Z888 Allergy status to other drugs, medicaments and biological substances status: Secondary | ICD-10-CM

## 2016-10-30 DIAGNOSIS — I2542 Coronary artery dissection: Secondary | ICD-10-CM | POA: Diagnosis not present

## 2016-10-30 DIAGNOSIS — Z8249 Family history of ischemic heart disease and other diseases of the circulatory system: Secondary | ICD-10-CM

## 2016-10-30 DIAGNOSIS — I2510 Atherosclerotic heart disease of native coronary artery without angina pectoris: Secondary | ICD-10-CM | POA: Diagnosis present

## 2016-10-30 DIAGNOSIS — I10 Essential (primary) hypertension: Secondary | ICD-10-CM | POA: Diagnosis present

## 2016-10-30 DIAGNOSIS — E785 Hyperlipidemia, unspecified: Secondary | ICD-10-CM | POA: Diagnosis present

## 2016-10-30 DIAGNOSIS — Z882 Allergy status to sulfonamides status: Secondary | ICD-10-CM

## 2016-10-30 DIAGNOSIS — F418 Other specified anxiety disorders: Secondary | ICD-10-CM | POA: Diagnosis present

## 2016-10-30 HISTORY — DX: Non-ST elevation (NSTEMI) myocardial infarction: I21.4

## 2016-10-30 LAB — BASIC METABOLIC PANEL
Anion gap: 10 (ref 5–15)
BUN: 9 mg/dL (ref 6–20)
CALCIUM: 10 mg/dL (ref 8.9–10.3)
CO2: 30 mmol/L (ref 22–32)
Chloride: 100 mmol/L — ABNORMAL LOW (ref 101–111)
Creatinine, Ser: 1.06 mg/dL (ref 0.61–1.24)
GFR calc Af Amer: >60 mL/min (ref >60)
GLUCOSE: 134 mg/dL — AB (ref 65–99)
Potassium: 3.6 mmol/L (ref 3.5–5.1)
Sodium: 140 mmol/L (ref 135–145)

## 2016-10-30 LAB — CBC
HCT: 44.2 % (ref 39.0–52.0)
Hemoglobin: 15.5 g/dL (ref 13.0–17.0)
MCH: 31.4 pg (ref 26.0–34.0)
MCHC: 35.1 g/dL (ref 30.0–36.0)
MCV: 89.7 fL (ref 78.0–100.0)
Platelets: 304 10*3/uL (ref 150–400)
RBC: 4.93 MIL/uL (ref 4.22–5.81)
RDW: 13.2 % (ref 11.5–15.5)
WBC: 9.5 10*3/uL (ref 4.0–10.5)

## 2016-10-30 LAB — I-STAT TROPONIN, ED: TROPONIN I, POC: 0.04 ng/mL (ref 0.00–0.08)

## 2016-10-30 NOTE — ED Triage Notes (Signed)
[facility] for the last four days, central with radiation to back. Denies n/v/sob. PTA received 324 ASA

## 2016-10-31 ENCOUNTER — Encounter (HOSPITAL_COMMUNITY): Payer: Self-pay | Admitting: Family Medicine

## 2016-10-31 ENCOUNTER — Inpatient Hospital Stay (HOSPITAL_COMMUNITY)
Admission: EM | Admit: 2016-10-31 | Discharge: 2016-11-01 | DRG: 246 | Disposition: A | Discharge status: Home / Self Care | Payer: Self-pay | Attending: Internal Medicine | Admitting: Internal Medicine

## 2016-10-31 ENCOUNTER — Encounter (HOSPITAL_COMMUNITY)
Admission: EM | Disposition: A | Discharge status: Home / Self Care | Payer: Self-pay | Source: Home / Self Care | Attending: Internal Medicine

## 2016-10-31 DIAGNOSIS — F418 Other specified anxiety disorders: Secondary | ICD-10-CM

## 2016-10-31 DIAGNOSIS — Z720 Tobacco use: Secondary | ICD-10-CM

## 2016-10-31 DIAGNOSIS — I2510 Atherosclerotic heart disease of native coronary artery without angina pectoris: Secondary | ICD-10-CM

## 2016-10-31 DIAGNOSIS — R079 Chest pain, unspecified: Secondary | ICD-10-CM | POA: Diagnosis present

## 2016-10-31 DIAGNOSIS — Z955 Presence of coronary angioplasty implant and graft: Secondary | ICD-10-CM

## 2016-10-31 DIAGNOSIS — I214 Non-ST elevation (NSTEMI) myocardial infarction: Secondary | ICD-10-CM

## 2016-10-31 DIAGNOSIS — E785 Hyperlipidemia, unspecified: Secondary | ICD-10-CM | POA: Diagnosis present

## 2016-10-31 HISTORY — DX: Depression, unspecified: F32.A

## 2016-10-31 HISTORY — DX: Anxiety disorder, unspecified: F41.9

## 2016-10-31 HISTORY — DX: Atherosclerotic heart disease of native coronary artery without angina pectoris: I25.10

## 2016-10-31 HISTORY — PX: CORONARY STENT INTERVENTION: CATH118234

## 2016-10-31 HISTORY — DX: Major depressive disorder, single episode, unspecified: F32.9

## 2016-10-31 HISTORY — DX: Personal history of other diseases of the digestive system: Z87.19

## 2016-10-31 HISTORY — PX: CORONARY ANGIOPLASTY WITH STENT PLACEMENT: SHX49

## 2016-10-31 HISTORY — PX: LEFT HEART CATH AND CORONARY ANGIOGRAPHY: CATH118249

## 2016-10-31 HISTORY — DX: Gastro-esophageal reflux disease without esophagitis: K21.9

## 2016-10-31 HISTORY — DX: Non-ST elevation (NSTEMI) myocardial infarction: I21.4

## 2016-10-31 LAB — I-STAT TROPONIN, ED: Troponin i, poc: 0.14 ng/mL (ref 0.00–0.08)

## 2016-10-31 LAB — POCT ACTIVATED CLOTTING TIME
ACTIVATED CLOTTING TIME: 307 s
ACTIVATED CLOTTING TIME: 340 s
Activated Clotting Time: 246 seconds

## 2016-10-31 LAB — HEPARIN LEVEL (UNFRACTIONATED): HEPARIN UNFRACTIONATED: 0.29 [IU]/mL — AB (ref 0.30–0.70)

## 2016-10-31 LAB — RAPID URINE DRUG SCREEN, HOSP PERFORMED
Amphetamines: NOT DETECTED
BENZODIAZEPINES: POSITIVE — AB
Barbiturates: NOT DETECTED
Cocaine: NOT DETECTED
Opiates: POSITIVE — AB
Tetrahydrocannabinol: NOT DETECTED

## 2016-10-31 LAB — PROTIME-INR
INR: 1
PROTHROMBIN TIME: 13.2 s (ref 11.4–15.2)

## 2016-10-31 SURGERY — LEFT HEART CATH AND CORONARY ANGIOGRAPHY

## 2016-10-31 MED ORDER — FENTANYL CITRATE (PF) 100 MCG/2ML IJ SOLN
INTRAMUSCULAR | Status: AC
  Filled 2016-10-31: qty 2

## 2016-10-31 MED ORDER — SODIUM CHLORIDE 0.45 % IV SOLN
INTRAVENOUS | Status: DC
  Administered 2016-10-31: 14:00:00 via INTRAVENOUS

## 2016-10-31 MED ORDER — IOPAMIDOL (ISOVUE-370) INJECTION 76%
INTRAVENOUS | Status: AC
  Filled 2016-10-31: qty 100

## 2016-10-31 MED ORDER — VERAPAMIL HCL 2.5 MG/ML IV SOLN
INTRAVENOUS | Status: AC
  Filled 2016-10-31: qty 4

## 2016-10-31 MED ORDER — HEPARIN SODIUM (PORCINE) 1000 UNIT/ML IJ SOLN
INTRAMUSCULAR | Status: AC
  Filled 2016-10-31: qty 1

## 2016-10-31 MED ORDER — SODIUM CHLORIDE 0.9 % WEIGHT BASED INFUSION
1.0000 mL/kg/h | INTRAVENOUS | Status: DC
  Administered 2016-10-31: 1 mL/kg/h via INTRAVENOUS

## 2016-10-31 MED ORDER — HEPARIN (PORCINE) IN NACL 2-0.9 UNIT/ML-% IJ SOLN
INTRAMUSCULAR | Status: DC | PRN
  Administered 2016-10-31: 1500 mL via INTRA_ARTERIAL

## 2016-10-31 MED ORDER — MORPHINE SULFATE (PF) 4 MG/ML IV SOLN
4.0000 mg | Freq: Once | INTRAVENOUS | Status: AC
  Administered 2016-10-31: 4 mg via INTRAVENOUS
  Filled 2016-10-31: qty 1

## 2016-10-31 MED ORDER — ACETAMINOPHEN 650 MG RE SUPP: 650.0000 mg | Freq: Four times a day (QID) | RECTAL | Status: DC | PRN

## 2016-10-31 MED ORDER — VERAPAMIL HCL 2.5 MG/ML IV SOLN
INTRAVENOUS | Status: AC
  Filled 2016-10-31: qty 2

## 2016-10-31 MED ORDER — SODIUM CHLORIDE 0.9 % IV SOLN: 250.0000 mL | INTRAVENOUS | Status: DC | PRN

## 2016-10-31 MED ORDER — SODIUM CHLORIDE 0.9 % WEIGHT BASED INFUSION: 1.0000 mL/kg/h | INTRAVENOUS | Status: DC

## 2016-10-31 MED ORDER — TIROFIBAN HCL IN NACL 5-0.9 MG/100ML-% IV SOLN
INTRAVENOUS | Status: AC
  Filled 2016-10-31: qty 100

## 2016-10-31 MED ORDER — TIROFIBAN HCL IN NACL 5-0.9 MG/100ML-% IV SOLN
INTRAVENOUS | Status: DC | PRN
  Administered 2016-10-31: 0.15 ug/kg/min via INTRAVENOUS

## 2016-10-31 MED ORDER — ONDANSETRON HCL 4 MG PO TABS: 4.0000 mg | ORAL_TABLET | Freq: Four times a day (QID) | ORAL | Status: DC | PRN

## 2016-10-31 MED ORDER — SODIUM CHLORIDE 0.9 % WEIGHT BASED INFUSION: 3.0000 mL/kg/h | INTRAVENOUS | Status: DC

## 2016-10-31 MED ORDER — LIDOCAINE HCL (PF) 1 % IJ SOLN
INTRAMUSCULAR | Status: DC | PRN
  Administered 2016-10-31: 2 mL

## 2016-10-31 MED ORDER — SODIUM CHLORIDE 0.9% FLUSH: 3.0000 mL | Freq: Two times a day (BID) | INTRAVENOUS | Status: DC

## 2016-10-31 MED ORDER — VERAPAMIL HCL 2.5 MG/ML IV SOLN
INTRAVENOUS | Status: DC | PRN
  Administered 2016-10-31: 17:00:00 via INTRA_ARTERIAL

## 2016-10-31 MED ORDER — LIDOCAINE HCL (PF) 1 % IJ SOLN
INTRAMUSCULAR | Status: AC
  Filled 2016-10-31: qty 30

## 2016-10-31 MED ORDER — METOPROLOL TARTRATE 25 MG PO TABS
25.0000 mg | ORAL_TABLET | Freq: Two times a day (BID) | ORAL | Status: DC
  Administered 2016-10-31: 25 mg via ORAL
  Filled 2016-10-31 (×3): qty 1

## 2016-10-31 MED ORDER — ANGIOPLASTY BOOK
Freq: Once | Status: AC
  Administered 2016-10-31: 20:00:00
  Filled 2016-10-31: qty 1

## 2016-10-31 MED ORDER — ACETAMINOPHEN 325 MG PO TABS
650.0000 mg | ORAL_TABLET | Freq: Four times a day (QID) | ORAL | Status: DC | PRN
  Administered 2016-10-31 (×2): 650 mg via ORAL
  Filled 2016-10-31 (×2): qty 2

## 2016-10-31 MED ORDER — ALPRAZOLAM 0.5 MG PO TABS
0.5000 mg | ORAL_TABLET | Freq: Four times a day (QID) | ORAL | Status: DC | PRN
  Administered 2016-10-31 – 2016-11-01 (×5): 0.5 mg via ORAL
  Filled 2016-10-31: qty 1
  Filled 2016-10-31 (×2): qty 2
  Filled 2016-10-31 (×2): qty 1

## 2016-10-31 MED ORDER — GI COCKTAIL ~~LOC~~: 30.0000 mL | Freq: Four times a day (QID) | ORAL | Status: DC | PRN

## 2016-10-31 MED ORDER — TICAGRELOR 90 MG PO TABS
ORAL_TABLET | ORAL | Status: DC | PRN
  Administered 2016-10-31: 180 mg via ORAL

## 2016-10-31 MED ORDER — SODIUM CHLORIDE 0.9% FLUSH
3.0000 mL | Freq: Two times a day (BID) | INTRAVENOUS | Status: DC
  Administered 2016-10-31: 3 mL via INTRAVENOUS

## 2016-10-31 MED ORDER — NICOTINE 21 MG/24HR TD PT24
21.0000 mg | MEDICATED_PATCH | Freq: Every day | TRANSDERMAL | Status: DC
  Filled 2016-10-31: qty 1

## 2016-10-31 MED ORDER — IOPAMIDOL (ISOVUE-370) INJECTION 76%
INTRAVENOUS | Status: DC | PRN
  Administered 2016-10-31: 240 mL via INTRA_ARTERIAL

## 2016-10-31 MED ORDER — MORPHINE SULFATE (PF) 4 MG/ML IV SOLN: 2.0000 mg | INTRAVENOUS | Status: DC | PRN

## 2016-10-31 MED ORDER — HEPARIN (PORCINE) IN NACL 100-0.45 UNIT/ML-% IJ SOLN
1500.0000 [IU]/h | INTRAMUSCULAR | Status: DC
  Administered 2016-10-31: 1400 [IU]/h via INTRAVENOUS
  Filled 2016-10-31 (×2): qty 250

## 2016-10-31 MED ORDER — NITROGLYCERIN 1 MG/10 ML FOR IR/CATH LAB
INTRA_ARTERIAL | Status: AC
  Filled 2016-10-31: qty 10

## 2016-10-31 MED ORDER — SODIUM CHLORIDE 0.9 % WEIGHT BASED INFUSION
3.0000 mL/kg/h | INTRAVENOUS | Status: DC
Start: 2016-10-31 — End: 2016-10-31
  Administered 2016-10-31: 3 mL/kg/h via INTRAVENOUS

## 2016-10-31 MED ORDER — TIROFIBAN HCL IN NACL 5-0.9 MG/100ML-% IV SOLN
0.1500 ug/kg/min | INTRAVENOUS | Status: DC
  Administered 2016-10-31 (×3): 0.15 ug/kg/min via INTRAVENOUS
  Filled 2016-10-31 (×3): qty 100

## 2016-10-31 MED ORDER — NITROGLYCERIN 1 MG/10 ML FOR IR/CATH LAB
INTRA_ARTERIAL | Status: DC | PRN
  Administered 2016-10-31 (×4): 200 ug via INTRACORONARY

## 2016-10-31 MED ORDER — NITROGLYCERIN IN D5W 200-5 MCG/ML-% IV SOLN
0.0000 ug/min | INTRAVENOUS | Status: DC
  Administered 2016-10-31: 19:00:00 10 ug/min via INTRAVENOUS

## 2016-10-31 MED ORDER — FENTANYL CITRATE (PF) 100 MCG/2ML IJ SOLN
INTRAMUSCULAR | Status: DC | PRN
  Administered 2016-10-31 (×3): 50 ug via INTRAVENOUS

## 2016-10-31 MED ORDER — SODIUM CHLORIDE 0.9% FLUSH
3.0000 mL | Freq: Two times a day (BID) | INTRAVENOUS | Status: DC
  Administered 2016-10-31: 20:00:00 3 mL via INTRAVENOUS

## 2016-10-31 MED ORDER — ASPIRIN 81 MG PO CHEW
81.0000 mg | CHEWABLE_TABLET | ORAL | Status: DC
  Filled 2016-10-31: qty 1

## 2016-10-31 MED ORDER — MIDAZOLAM HCL 2 MG/2ML IJ SOLN
INTRAMUSCULAR | Status: AC
  Filled 2016-10-31: qty 2

## 2016-10-31 MED ORDER — TICAGRELOR 90 MG PO TABS
90.0000 mg | ORAL_TABLET | Freq: Two times a day (BID) | ORAL | Status: DC
  Administered 2016-11-01 (×2): 90 mg via ORAL
  Filled 2016-10-31 (×2): qty 1

## 2016-10-31 MED ORDER — SERTRALINE HCL 100 MG PO TABS
100.0000 mg | ORAL_TABLET | Freq: Every day | ORAL | Status: DC
  Administered 2016-11-01: 11:00:00 100 mg via ORAL
  Filled 2016-10-31: qty 2
  Filled 2016-10-31 (×2): qty 1

## 2016-10-31 MED ORDER — ONDANSETRON HCL 4 MG/2ML IJ SOLN
4.0000 mg | Freq: Four times a day (QID) | INTRAMUSCULAR | Status: DC | PRN
  Administered 2016-10-31: 20:00:00 4 mg via INTRAVENOUS
  Filled 2016-10-31: qty 2

## 2016-10-31 MED ORDER — HEPARIN BOLUS VIA INFUSION
4000.0000 [IU] | Freq: Once | INTRAVENOUS | Status: AC
  Administered 2016-10-31: 4000 [IU] via INTRAVENOUS
  Filled 2016-10-31: qty 4000

## 2016-10-31 MED ORDER — TIROFIBAN (AGGRASTAT) BOLUS VIA INFUSION
INTRAVENOUS | Status: DC | PRN
  Administered 2016-10-31: 2505 ug via INTRAVENOUS

## 2016-10-31 MED ORDER — KETOROLAC TROMETHAMINE 15 MG/ML IJ SOLN
15.0000 mg | Freq: Once | INTRAMUSCULAR | Status: DC
  Filled 2016-10-31: qty 1

## 2016-10-31 MED ORDER — TICAGRELOR 90 MG PO TABS
ORAL_TABLET | ORAL | Status: AC
  Filled 2016-10-31: qty 2

## 2016-10-31 MED ORDER — NITROGLYCERIN IN D5W 200-5 MCG/ML-% IV SOLN
0.0000 ug/min | Freq: Once | INTRAVENOUS | Status: AC
  Administered 2016-10-31: 5 ug/min via INTRAVENOUS
  Filled 2016-10-31: qty 250

## 2016-10-31 MED ORDER — SODIUM CHLORIDE 0.9% FLUSH: 3.0000 mL | INTRAVENOUS | Status: DC | PRN

## 2016-10-31 MED ORDER — IOPAMIDOL (ISOVUE-370) INJECTION 76%
INTRAVENOUS | Status: AC
  Filled 2016-10-31: qty 50

## 2016-10-31 MED ORDER — ASPIRIN 81 MG PO CHEW
81.0000 mg | CHEWABLE_TABLET | ORAL | Status: AC
  Administered 2016-10-31: 81 mg via ORAL

## 2016-10-31 MED ORDER — MIDAZOLAM HCL 2 MG/2ML IJ SOLN
INTRAMUSCULAR | Status: DC | PRN
  Administered 2016-10-31: 1 mg via INTRAVENOUS

## 2016-10-31 MED ORDER — ASPIRIN 81 MG PO CHEW
81.0000 mg | CHEWABLE_TABLET | Freq: Every day | ORAL | Status: DC
  Administered 2016-11-01: 11:00:00 81 mg via ORAL
  Filled 2016-10-31: qty 1

## 2016-10-31 MED ORDER — HEPARIN (PORCINE) IN NACL 2-0.9 UNIT/ML-% IJ SOLN
INTRAMUSCULAR | Status: AC
Start: 2016-10-31 — End: 2016-10-31
  Filled 2016-10-31: qty 1500

## 2016-10-31 MED ORDER — HEPARIN SODIUM (PORCINE) 1000 UNIT/ML IJ SOLN
INTRAMUSCULAR | Status: DC | PRN
  Administered 2016-10-31: 5000 [IU] via INTRAVENOUS
  Administered 2016-10-31: 2000 [IU] via INTRAVENOUS
  Administered 2016-10-31: 4000 [IU] via INTRAVENOUS

## 2016-10-31 SURGICAL SUPPLY — 25 items
BALLN EMERGE MR 2.5X12 (BALLOONS) ×3
BALLN ~~LOC~~ EUPHORA RX 2.75X27 (BALLOONS) ×3
BALLN ~~LOC~~ MOZEC 3.0X15 (BALLOONS) ×3
BALLOON EMERGE MR 2.5X12 (BALLOONS) IMPLANT
BALLOON ~~LOC~~ EUPHORA RX 2.75X27 (BALLOONS) IMPLANT
BALLOON ~~LOC~~ MOZEC 3.0X15 (BALLOONS) IMPLANT
CATH INFINITI 5FR ANG PIGTAIL (CATHETERS) ×2 IMPLANT
CATH OPTITORQUE JACKY 4.0 5F (CATHETERS) ×2 IMPLANT
CATH VISTA GUIDE 6FR XBLAD3.5 (CATHETERS) ×2 IMPLANT
DEVICE RAD COMP TR BAND LRG (VASCULAR PRODUCTS) ×4 IMPLANT
GLIDESHEATH SLEND SS 6F .021 (SHEATH) ×2 IMPLANT
GUIDEWIRE INQWIRE 1.5J.035X260 (WIRE) IMPLANT
INQWIRE 1.5J .035X260CM (WIRE) ×3
KIT ENCORE 26 ADVANTAGE (KITS) ×2 IMPLANT
KIT HEART LEFT (KITS) ×3 IMPLANT
PACK CARDIAC CATHETERIZATION (CUSTOM PROCEDURE TRAY) ×3 IMPLANT
STENT RESOLUTE ONYX 2.25X12 (Permanent Stent) ×1 IMPLANT
STENT RESOLUTE ONYX 2.25X15 (Permanent Stent) ×1 IMPLANT
STENT RESOLUTE ONYX 2.5X30 (Permanent Stent) ×1 IMPLANT
SYR MEDRAD MARK V 150ML (SYRINGE) ×3 IMPLANT
TRANSDUCER W/STOPCOCK (MISCELLANEOUS) ×3 IMPLANT
TUBING CIL FLEX 10 FLL-RA (TUBING) ×3 IMPLANT
WIRE HI TORQ BMW 190CM (WIRE) ×2 IMPLANT
WIRE INTUITION PROPEL ST 180CM (WIRE) ×2 IMPLANT
WIRE RUNTHROUGH .014X180CM (WIRE) ×2 IMPLANT

## 2016-10-31 NOTE — Interval H&P Note (Signed)
Cath Lab Visit (complete for each Cath Lab visit)  Clinical Evaluation Leading to the Procedure:   ACS: Yes.    Non-ACS:  N/A   History and Physical Interval Note:  10/31/2016 3:44 PM  Bruce King  has presented today for surgery, with the diagnosis of cp  The various methods of treatment have been discussed with the patient and family. After consideration of risks, benefits and other options for treatment, the patient has consented to  Procedure(s): Left Heart Cath and Coronary Angiography (N/A) as a surgical intervention .  The patient's history has been reviewed, patient examined, no change in status, stable for surgery.  I have reviewed the patient's chart and labs.  Questions were answered to the patient's satisfaction.     Lorine BearsMuhammad Arida

## 2016-10-31 NOTE — Progress Notes (Signed)
ANTICOAGULATION CONSULT NOTE - Initial Consult  Pharmacy Consult for Heparin Indication: chest pain/ACS  Allergies  Allergen Reactions  . Bactrim [Sulfamethoxazole-Trimethoprim] Other (See Comments)    Felt Drunk  . Lipitor [Atorvastatin] Other (See Comments)    Stiffness in joints  . Pravastatin     stiffness    Patient Measurements:    Ht: 73 in  Wt: 100kg Heparin Dosing Weight: 100 kg  Vital Signs: Temp: 98.2 F (36.8 C) (02/06 2228) BP: 160/107 (02/07 0043) Pulse Rate: 73 (02/07 0043)  Labs:  Recent Labs  10/30/16 2232  HGB 15.5  HCT 44.2  PLT 304  CREATININE 1.06    CrCl cannot be calculated (Unknown ideal weight.).   Medical History: Past Medical History:  Diagnosis Date  . GAD (generalized anxiety disorder)   . Hyperlipidemia     Medications:  See electronic med rec  Assessment: 46 y.o. M presents with CP. To begin heparin for r/o ACS. CBC ok on admission. No AC PTA.  Goal of Therapy:  Heparin level 0.3-0.7 units/ml Monitor platelets by anticoagulation protocol: Yes   Plan:  Heparin IV bolus 4000 uints Heparin gtt at 1400 units/hr Will f/u heparin level in 6 hours Daily heparin level and CBC  Christoper Fabianaron Gregorey Nabor, PharmD, BCPS Clinical pharmacist, pager [phone] 10/31/2016,5:14 AM

## 2016-10-31 NOTE — Progress Notes (Signed)
This is a no charge note   Pending admission per Dr. Wilkie AyeHorton  46 year old male with past medical history of hyperlipidemia, depression, anxiety, who presents with intermittent chest pain for 4 days, associated with SOB. The chest pain has become more consistent today. Second troponin is positive at 0.14. EKG showed ST depression in II/III and biphasic T-wave in V3-V5. Pt is started with IV nitro gtt and IV Heparin in ED. Accepted SDU as inpt.  Lorretta HarpXilin Ethel Meisenheimer, MD  [facility] Pager [phone]  If 7PM-7AM, please contact night-coverage [email or web address] Password [email or web address] 10/31/2016, 5:43 AM

## 2016-10-31 NOTE — ED Provider Notes (Signed)
[facility]-EMERGENCY DEPT Provider Note   CSN: [id number] Arrival date & time: 10/30/16  2223  By signing my name below, I, Bruce King, attest that this documentation has been prepared under the direction and in the presence of [name], MD.  Electronically Signed: [name], ED Scribe. 10/31/16. 3:47 AM.    History   Chief Complaint Chief Complaint  Patient presents with  . Chest Pain   The history is provided by the patient. No language interpreter was used.   HPI Comments: Bruce King is a 46 y.o. male brought in by ambulance, who has a PMhx of chest pain, GAD and HLD presents to the Emergency Department complaining of intermittent central chest pain x 4 days that became persistently constant last night. Pt reports associated shortness of breath. He rates is current pain a 4-5/10. He describes the pain as a burning sensation and notes the pain is radiating to his bilateral shoulders.The pain is worse upon exertion occasionally and his pain is not post prandial. Pt has a hx of GAD and reports his symptoms are different than his anxiety attacks, which he states he has not had in over one year. He had a stress test performed ~ 1 year ago by Dr. Purvis Sheffield and was told to follow up prn. Pt has a paternal hx of CAD before the age of 65. He denies hx of DM or HTN. He denies nausea and vomiting.  Past Medical History:  Diagnosis Date  . GAD (generalized anxiety disorder)   . Hyperlipidemia     Patient Active Problem List   Diagnosis Date Noted  . Chest pain 09/28/2014  . Generalized anxiety disorder 03/02/2013  . Hyperlipidemia 03/02/2013    History reviewed. No pertinent surgical history.     Home Medications    Prior to Admission medications   Medication Sig Start Date End Date Taking? Authorizing Provider  ALPRAZolam (XANAX) 0.5 MG tablet TAKE  (1)  TABLET  FOUR TIMES DAILY AS NEEDED. Patient taking differently: Take 0.5 mg by mouth. TAKE  (1)  TABLET   FOUR TIMES DAILY AS NEEDED FOR ANXIETY 07/24/16  Yes Babs Sciara, MD  ibuprofen (ADVIL,MOTRIN) 200 MG tablet Take 400 mg by mouth every 6 (six) hours as needed for mild pain or moderate pain.   Yes Historical Provider, MD  sertraline (ZOLOFT) 100 MG tablet Take 1 tablet (100 mg total) by mouth daily. 07/24/16  Yes Babs Sciara, MD  buPROPion (WELLBUTRIN SR) 150 MG 12 hr tablet Take 1 tablet (150 mg total) by mouth 2 (two) times daily. Patient not taking: Reported on 10/31/2016 07/24/16   Babs Sciara, MD    Family History Family History  Problem Relation Age of Onset  . Hypertension Mother   . Heart attack Father     Social History Social History  Substance Use Topics  . Smoking status: Current Every Day Smoker    Packs/day: 1.00    Types: Cigarettes    Start date: 09/28/1989  . Smokeless tobacco: Former Neurosurgeon    Types: Chew  . Alcohol use No     Allergies   Bactrim [sulfamethoxazole-trimethoprim]; Lipitor [atorvastatin]; and Pravastatin   Review of Systems Review of Systems  Constitutional: Negative for fever.  Respiratory: Positive for shortness of breath.   Cardiovascular: Positive for chest pain.  Gastrointestinal: Negative for abdominal pain, nausea and vomiting.  All other systems reviewed and are negative.    Physical Exam Updated Vital Signs BP (!) 158/105  Pulse 67   Temp 98.2 F (36.8 C)   Resp 17   SpO2 97%   Physical Exam  Constitutional: He is oriented to person, place, and time. He appears well-developed and well-nourished. No distress.  HENT:  Head: Normocephalic and atraumatic.  Cardiovascular: Normal rate, regular rhythm and normal heart sounds.   No murmur heard. Pulmonary/Chest: Effort normal and breath sounds normal. No respiratory distress. He has no wheezes. He exhibits no tenderness.  Abdominal: Soft. Bowel sounds are normal. There is no tenderness. There is no rebound.  Musculoskeletal: He exhibits no edema.  Neurological: He is  alert and oriented to person, place, and time.  Skin: Skin is warm and dry.  Psychiatric: He has a normal mood and affect.  Nursing note and vitals reviewed.    ED Treatments / Results  DIAGNOSTIC STUDIES: Oxygen Saturation is 98% on RA, normal by my interpretation.  COORDINATION OF CARE:  3:26 AM Discussed treatment plan with pt at bedside and pt agreed to plan.  Labs (all labs ordered are listed, but only abnormal results are displayed) Labs Reviewed  BASIC METABOLIC PANEL - Abnormal; Notable for the following:       Result Value   Chloride 100 (*)    Glucose, Bld 134 (*)    All other components within normal limits  I-STAT TROPOININ, ED - Abnormal; Notable for the following:    Troponin i, poc 0.14 (*)    All other components within normal limits  CBC  HEPARIN LEVEL (UNFRACTIONATED)  I-STAT TROPOININ, ED    EKG  EKG Interpretation  Date/Time:  Tuesday October 30 2016 22:27:58 EST Ventricular Rate:  72 PR Interval:  162 QRS Duration: 104 QT Interval:  366 QTC Calculation: 400 R Axis:   41 Text Interpretation:  Normal sinus rhythm Cannot rule out Inferior infarct , age undetermined Abnormal ECG Confirmed by Jadene PieriniWARD,  DO, KRISTEN [phone] on 10/31/2016 12:49:11 AM       Radiology Dg Chest 2 View  Result Date: 10/30/2016 CLINICAL DATA:  Left-sided chest pain and hypertension. EXAM: CHEST  2 VIEW COMPARISON:  03/09/2011 FINDINGS: Normal heart size and pulmonary vascularity. No focal airspace disease or consolidation in the lungs. No blunting of costophrenic angles. No pneumothorax. Mediastinal contours appear intact. Linear scarring in the left mid lung. No change since prior study. IMPRESSION: No active cardiopulmonary disease. Electronically Signed   By: Burman NievesWilliam  Stevens M.D.   On: 10/30/2016 22:49    Procedures Procedures (including critical care time)  CRITICAL CARE Performed by: Shon BatonHORTON, Yancey Pedley F   Total critical care time: 25 minutes  Critical care time was  exclusive of separately billable procedures and treating other patients.  Critical care was necessary to treat or prevent imminent or life-threatening deterioration.  Critical care was time spent personally by me on the following activities: development of treatment plan with patient and/or surrogate as well as nursing, discussions with consultants, evaluation of patient's response to treatment, examination of patient, obtaining history from patient or surrogate, ordering and performing treatments and interventions, ordering and review of laboratory studies, ordering and review of radiographic studies, pulse oximetry and re-evaluation of patient's condition.   Medications Ordered in ED Medications  ketorolac (TORADOL) 15 MG/ML injection 15 mg (not administered)  heparin bolus via infusion 4,000 Units (not administered)  heparin ADULT infusion 100 units/mL (25000 units/26950mL sodium chloride 0.45%) (not administered)  nitroGLYCERIN 50 mg in dextrose 5 % 250 mL (0.2 mg/mL) infusion (20 mcg/min Intravenous Rate/Dose Change 10/31/16 0532)  morphine 4 MG/ML injection 4 mg (4 mg Intravenous Given 10/31/16 0514)     Initial Impression / Assessment and Plan / ED Course  I have reviewed the triage vital signs and the nursing notes.  Pertinent labs & imaging results that were available during my care of the patient were reviewed by me and considered in my medical decision making (see chart for details).     Patient presents for chest pain. Description somewhat atypical but has become more exertional in nature. He is hypertensive. Otherwise nontoxic. Initial EKG no significant changes. Initial troponin 0.04. He has been in the waiting room for over 4 hours at this time. Will repeat troponin. Initially Toradol ordered for pain management. He received a full dose aspirin by EMS.  5:49 AM Repeat troponin is 0.14. He continues to have pain and has not been dose medication at this time. Will give morphine and  start heparin and nitroglycerin drips. Cardiology consulted.  Discussed with Dr. Clyde Lundborg.  Will admit to stepdown unit.  Final Clinical Impressions(s) / ED Diagnoses   Final diagnoses:  NSTEMI (non-ST elevated myocardial infarction) (HCC)   I personally performed the services described in this documentation, which was scribed in my presence. The recorded information has been reviewed and is accurate.   New Prescriptions New Prescriptions   No medications on file     [name], MD 10/31/16 [phone]

## 2016-10-31 NOTE — Consult Note (Signed)
Cardiology Consult    Patient ID: Bruce King MRN: [id number], DOB/AGE: 45/10/1970   Admit date: 10/31/2016 Date of Consult: 10/31/2016  Primary Physician: Lilyan PuntScott Luking, MD Primary Cardiologist: none Requesting Provider: ED  Patient Profile    46 y o man with NSTEMI  Past Medical History   Past Medical History:  Diagnosis Date  . GAD (generalized anxiety disorder)   . Hyperlipidemia     History reviewed. No pertinent surgical history.   Allergies  Allergies  Allergen Reactions  . Bactrim [Sulfamethoxazole-Trimethoprim] Other (See Comments)    Felt Drunk  . Lipitor [Atorvastatin] Other (See Comments)    Stiffness in joints  . Pravastatin     stiffness    History of Present Illness    Bruce King has no PMH. He presents with a week long history of cp with activity. Last night he had one episode that started with activity (feeding dog) and did not resolve at rest. This is when he decided to go to ED. He denies SOB, PND, LEE or orthopnea. He is followed by PCP and his total cholesterol >210. Tried statins but had muscle cramps, with three different statins. BP at home around 135-140.  Strong family history with 2 family members with MI before age 45 (father and grandmother).  Smoker: 20 PY  Inpatient Medications    . heparin  4,000 Units Intravenous Once  . ketorolac  15 mg Intravenous Once    Family History    Family History  Problem Relation Age of Onset  . Hypertension Mother   . Heart attack Father     Social History    Social History   Social History  . Marital status: Single    Spouse name: N/A  . Number of children: N/A  . Years of education: N/A   Occupational History  . Not on file.   Social History Main Topics  . Smoking status: Current Every Day Smoker    Packs/day: 1.00    Types: Cigarettes    Start date: 09/28/1989  . Smokeless tobacco: Former NeurosurgeonUser    Types: Chew  . Alcohol use No  . Drug use: No  . Sexual activity: Not on  file   Other Topics Concern  . Not on file   Social History Narrative  . No narrative on file     Review of Systems    General:  No chills, fever, night sweats or weight changes.  Cardiovascular:  No chest pain, dyspnea on exertion, edema, orthopnea, palpitations, paroxysmal nocturnal dyspnea. Dermatological: No rash, lesions/masses Respiratory: No cough, dyspnea Urologic: No hematuria, dysuria Abdominal:   No nausea, vomiting, diarrhea, bright red blood per rectum, melena, or hematemesis Neurologic:  No visual changes, wkns, changes in mental status. All other systems reviewed and are otherwise negative except as noted above.  Physical Exam    Blood pressure (!) 160/107, pulse 73, temperature 98.2 F (36.8 C), resp. rate 18, SpO2 98 %.  General: Pleasant, NAD Psych: Normal affect. Neuro: Alert and oriented X 3. Moves all extremities spontaneously. HEENT: Normal  Neck: Supple without bruits or JVD. Lungs:  Resp regular and unlabored, CTA. Heart: RRR no s3, s4, or murmurs. Abdomen: Soft, non-tender, non-distended, BS + x 4.  Extremities: No clubbing, cyanosis or edema. DP/PT/Radials 2+ and equal bilaterally.  Labs    Troponin [facility](Point of Care Test)  Recent Labs  10/31/16 0453  TROPIPOC 0.14*   No results for input(s): CKTOTAL, CKMB, TROPONINI in the last 72 hours.  Lab Results  Component Value Date   WBC 9.5 10/30/2016   HGB 15.5 10/30/2016   HCT 44.2 10/30/2016   MCV 89.7 10/30/2016   PLT 304 10/30/2016    Recent Labs Lab 10/30/16 2232  NA 140  K 3.6  CL 100*  CO2 30  BUN 9  CREATININE 1.06  CALCIUM 10.0  GLUCOSE 134*   Lab Results  Component Value Date   CHOL 217 (H) 02/02/2016   HDL 23 (L) 02/02/2016   LDLCALC 133 (H) 02/02/2016   TRIG 307 (H) 02/02/2016   No results found for: [facility]   Radiology Studies    Dg Chest 2 View  Result Date: 10/30/2016 CLINICAL DATA:  Left-sided chest pain and hypertension. EXAM: CHEST  2 VIEW COMPARISON:   03/09/2011 FINDINGS: Normal heart size and pulmonary vascularity. No focal airspace disease or consolidation in the lungs. No blunting of costophrenic angles. No pneumothorax. Mediastinal contours appear intact. Linear scarring in the left mid lung. No change since prior study. IMPRESSION: No active cardiopulmonary disease. Electronically Signed   By: Burman Nieves M.D.   On: 10/30/2016 22:49    ECG & Cardiac Imaging    NSR, Q waves in inferior leads.   Assessment & Plan    Bruce King has an NSTEMI. HAs a family risk as well as hypercholesteremia and he is a smoker.  Recommendations: - LHC today, keep on heparin, asa. No P2Y12.  - Check lipids, A1c, TFT - Start on Statin (Atorvastatin 40mg ). He hasnt tried that one yet.  Signed, Macario Golds, MD 10/31/2016, 5:39 AM

## 2016-10-31 NOTE — H&P (View-Only) (Signed)
Progress Note  Patient Name: Bruce King Date of Encounter: 10/31/2016  Primary Cardiologist: New  Subjective   Feeling better after being placed on nitro, still with mild chest burning.   Inpatient Medications    Scheduled Meds:  Continuous Infusions: . heparin 1,400 Units/hr (10/31/16 0647)   PRN Meds:    Vital Signs    Vitals:   10/31/16 0615 10/31/16 0630 10/31/16 0645 10/31/16 0718  BP: 161/100 141/99 137/96 138/96  Pulse: 70 66 67 70  Resp: 17 15 15 14   Temp:      SpO2: 97% 96% 96% 95%   No intake or output data in the 24 hours ending 10/31/16 0816 There were no vitals filed for this visit.  Telemetry    SR - Personally Reviewed  ECG    SR with  q waves in inferior leads? - Personally Reviewed  Physical Exam   GEN: Pleasant WM, No acute distress.   Neck: No JVD Cardiac: RRR, no murmurs, rubs, or gallops.  Respiratory: Clear to auscultation bilaterally. GI: Soft, nontender, non-distended  MS: No edema; No deformity. Neuro:  Nonfocal  Psych: Normal affect   Labs    Chemistry Recent Labs Lab 10/30/16 2232  NA 140  K 3.6  CL 100*  CO2 30  GLUCOSE 134*  BUN 9  CREATININE 1.06  CALCIUM 10.0  GFRNONAA >60  GFRAA >60  ANIONGAP 10     Hematology Recent Labs Lab 10/30/16 2232  WBC 9.5  RBC 4.93  HGB 15.5  HCT 44.2  MCV 89.7  MCH 31.4  MCHC 35.1  RDW 13.2  PLT 304    Cardiac EnzymesNo results for input(s): TROPONINI in the last 168 hours.  Recent Labs Lab 10/30/16 2249 10/31/16 0453  TROPIPOC 0.04 0.14*     BNPNo results for input(s): BNP, PROBNP in the last 168 hours.   DDimer No results for input(s): DDIMER in the last 168 hours.   Radiology    Dg Chest 2 View  Result Date: 10/30/2016 CLINICAL DATA:  Left-sided chest pain and hypertension. EXAM: CHEST  2 VIEW COMPARISON:  03/09/2011 FINDINGS: Normal heart size and pulmonary vascularity. No focal airspace disease or consolidation in the lungs. No blunting of  costophrenic angles. No pneumothorax. Mediastinal contours appear intact. Linear scarring in the left mid lung. No change since prior study. IMPRESSION: No active cardiopulmonary disease. Electronically Signed   By: Burman Nieves M.D.   On: 10/30/2016 22:49    Cardiac Studies   N/A  Patient Profile     46 y.o. male with PMH of HL, GAD, and tobacco abuse who presented with exertional chest pain over the past couple of days.   Assessment & Plan    1. NSTEMI: Currently on heparin and nitro. Chest pressure has mostly resolved, still has some burning sensation across his chest. Trop 0.04>>0.14. Given his reports of exertional chest pain, hx of tobacco use and HL (not currently on statin as he is intolerant) and strong family hx recommend cardiac catheterization.  -- Has been NPO, will place cath orders.  -- The patient understands that risks included but are not limited to stroke (1 in 1000), death (1 in 1000), kidney failure [usually temporary] (1 in 500), bleeding (1 in 200), allergic reaction [possibly serious] (1 in 200).  -- check echo  2. HL: Reports he has been on multiple statins in the past, but unable to tolerate due to myalgias.  -- Last lipid panel 5/17, LDL 133, HDL 23,  cholesterol 217. Depending on cath results, may need to consider PCSK 9 inhibitors.   3. GAD: Takes Xanax daily, followed by PCP  4. Tobacco Abuse: 20 py hx, cessation strongly encouraged.   5. HTN: States he has been borderline HTN for the past couple of years, was tried on blood pressure medications and dropped his BP. Initially hypertensive, but now improved. Currently controlled on IV nitro.   Signed, Laverda PageLindsay Roberts, NP  10/31/2016, 8:16 AM    Patient seen and examined. Agree with assessment and plan. Pt admitted by fellow earlier today. Trop 0.14.  Agree with plans for definitive cardiac cath. Pt aware of risks/benefits as above. Consider zetia if statin intolerant, and potential PCSK9 inhibition if CAD.  Will need BP control and risk factor modification.   Lennette Biharihomas A. Kelly, MD, [facility] Hospital[facility] 10/31/2016 9:33 AM

## 2016-10-31 NOTE — Progress Notes (Signed)
ANTICOAGULATION CONSULT NOTE   Pharmacy Consult for Aggrastat (tirofiban) x 12 hours post PCI Indication: chest pain/ACS,  Post PCI   Allergies  Allergen Reactions  . Bactrim [Sulfamethoxazole-Trimethoprim] Other (See Comments)    Felt Drunk  . Lipitor [Atorvastatin] Other (See Comments)    Stiffness in joints  . Pravastatin     stiffness    Patient Measurements: Weight: 220 lb 14.4 oz (100.2 kg) (per previous encounter)  Ht: 73 in  Wt: 100kg Heparin Dosing Weight: 100 kg  Vital Signs: Temp: 97.7 F (36.5 C) (02/07 1746) Temp Source: Oral (02/07 1746) BP: 118/77 (02/07 1746) Pulse Rate: 69 (02/07 1746)  Labs:  Recent Labs  10/30/16 2232 10/31/16 1256  HGB 15.5  --   HCT 44.2  --   PLT 304  --   LABPROT  --  13.2  INR  --  1.00  HEPARINUNFRC  --  0.29*  CREATININE 1.06  --     Estimated Creatinine Clearance: 109.5 mL/min (by C-G formula based on SCr of 1.06 mg/dL).  Assessment: 46 y.o. M presents with CP. Started on IV heparin for r/o ACS. CBC ok on admission. No AC PTA. On IV heparin prior to cardiac cath.  Now s/p PCI Heparin discontinued. Aggrastat bolus and infusion started in Cath lab  Aggrastat bolus given in cath lab @ 17:04, PCI end time 17:14. Aggrastat infusion to continue x 12 hr post PCI, stop tomorrow @ 05:30.  No bleeding or hematoma reported.    Goal of Therapy:  Heparin level 0.3-0.7 units/ml Monitor platelets by anticoagulation protocol: Yes   Plan:  Plan aggrastat 0.15 mcg/kg/min x 12 h post PCI. ---stops at 0530 2/8 CBC pending  in AM  Lysle Pearlachel Rumbarger, PharmD, BCPS Pager # [phone] 10/31/2016 6:00 PM

## 2016-10-31 NOTE — Progress Notes (Signed)
Progress Note  Patient Name: Bruce King Date of Encounter: 10/31/2016  Primary Cardiologist: New  Subjective   Feeling better after being placed on nitro, still with mild chest burning.   Inpatient Medications    Scheduled Meds:  Continuous Infusions: . heparin 1,400 Units/hr (10/31/16 0647)   PRN Meds:    Vital Signs    Vitals:   10/31/16 0615 10/31/16 0630 10/31/16 0645 10/31/16 0718  BP: 161/100 141/99 137/96 138/96  Pulse: 70 66 67 70  Resp: 17 15 15 14   Temp:      SpO2: 97% 96% 96% 95%   No intake or output data in the 24 hours ending 10/31/16 0816 There were no vitals filed for this visit.  Telemetry    SR - Personally Reviewed  ECG    SR with  q waves in inferior leads? - Personally Reviewed  Physical Exam   GEN: Pleasant WM, No acute distress.   Neck: No JVD Cardiac: RRR, no murmurs, rubs, or gallops.  Respiratory: Clear to auscultation bilaterally. GI: Soft, nontender, non-distended  MS: No edema; No deformity. Neuro:  Nonfocal  Psych: Normal affect   Labs    Chemistry Recent Labs Lab 10/30/16 2232  NA 140  K 3.6  CL 100*  CO2 30  GLUCOSE 134*  BUN 9  CREATININE 1.06  CALCIUM 10.0  GFRNONAA >60  GFRAA >60  ANIONGAP 10     Hematology Recent Labs Lab 10/30/16 2232  WBC 9.5  RBC 4.93  HGB 15.5  HCT 44.2  MCV 89.7  MCH 31.4  MCHC 35.1  RDW 13.2  PLT 304    Cardiac EnzymesNo results for input(s): TROPONINI in the last 168 hours.  Recent Labs Lab 10/30/16 2249 10/31/16 0453  TROPIPOC 0.04 0.14*     BNPNo results for input(s): BNP, PROBNP in the last 168 hours.   DDimer No results for input(s): DDIMER in the last 168 hours.   Radiology    Dg Chest 2 View  Result Date: 10/30/2016 CLINICAL DATA:  Left-sided chest pain and hypertension. EXAM: CHEST  2 VIEW COMPARISON:  03/09/2011 FINDINGS: Normal heart size and pulmonary vascularity. No focal airspace disease or consolidation in the lungs. No blunting of  costophrenic angles. No pneumothorax. Mediastinal contours appear intact. Linear scarring in the left mid lung. No change since prior study. IMPRESSION: No active cardiopulmonary disease. Electronically Signed   By: Burman Nieves M.D.   On: 10/30/2016 22:49    Cardiac Studies   N/A  Patient Profile     46 y.o. male with PMH of HL, GAD, and tobacco abuse who presented with exertional chest pain over the past couple of days.   Assessment & Plan    1. NSTEMI: Currently on heparin and nitro. Chest pressure has mostly resolved, still has some burning sensation across his chest. Trop 0.04>>0.14. Given his reports of exertional chest pain, hx of tobacco use and HL (not currently on statin as he is intolerant) and strong family hx recommend cardiac catheterization.  -- Has been NPO, will place cath orders.  -- The patient understands that risks included but are not limited to stroke (1 in 1000), death (1 in 1000), kidney failure [usually temporary] (1 in 500), bleeding (1 in 200), allergic reaction [possibly serious] (1 in 200).  -- check echo  2. HL: Reports he has been on multiple statins in the past, but unable to tolerate due to myalgias.  -- Last lipid panel 5/17, LDL 133, HDL 23,  cholesterol 217. Depending on cath results, may need to consider PCSK 9 inhibitors.   3. GAD: Takes Xanax daily, followed by PCP  4. Tobacco Abuse: 20 py hx, cessation strongly encouraged.   5. HTN: States he has been borderline HTN for the past couple of years, was tried on blood pressure medications and dropped his BP. Initially hypertensive, but now improved. Currently controlled on IV nitro.   Signed, Laverda PageLindsay Roberts, NP  10/31/2016, 8:16 AM    Patient seen and examined. Agree with assessment and plan. Pt admitted by fellow earlier today. Trop 0.14.  Agree with plans for definitive cardiac cath. Pt aware of risks/benefits as above. Consider zetia if statin intolerant, and potential PCSK9 inhibition if CAD.  Will need BP control and risk factor modification.   Lennette Biharihomas A. Adden Strout, MD, [facility] 10/31/2016 9:33 AM

## 2016-10-31 NOTE — Progress Notes (Signed)
ANTICOAGULATION CONSULT NOTE   Pharmacy Consult for Heparin Indication: chest pain/ACS  Allergies  Allergen Reactions  . Bactrim [Sulfamethoxazole-Trimethoprim] Other (See Comments)    Felt Drunk  . Lipitor [Atorvastatin] Other (See Comments)    Stiffness in joints  . Pravastatin     stiffness    Patient Measurements: Weight: 220 lb 14.4 oz (100.2 kg) (per previous encounter)  Ht: 73 in  Wt: 100kg Heparin Dosing Weight: 100 kg  Vital Signs: BP: 131/78 (02/07 1300) Pulse Rate: 79 (02/07 1300)  Labs:  Recent Labs  10/30/16 2232 10/31/16 1256  HGB 15.5  --   HCT 44.2  --   PLT 304  --   HEPARINUNFRC  --  0.29*  CREATININE 1.06  --     Estimated Creatinine Clearance: 109.5 mL/min (by C-G formula based on SCr of 1.06 mg/dL).  Assessment: 46 y.o. M presents with CP. Started on IV heparin for r/o ACS. CBC ok on admission. No AC PTA. Initial heparin level is slightly low at 0.29. Planning cardiac cath this afternoon.   Goal of Therapy:  Heparin level 0.3-0.7 units/ml Monitor platelets by anticoagulation protocol: Yes   Plan:  Increase heparin gtt to 1500 units/hr F/u post-cath  Lysle Pearlachel Nawal Burling, PharmD, BCPS Pager # [phone] 10/31/2016 1:40 PM

## 2016-10-31 NOTE — ED Notes (Signed)
Patient denies pain and is resting comfortably.  

## 2016-10-31 NOTE — ED Notes (Signed)
To cath lab now, heparin stopped as ordered

## 2016-10-31 NOTE — H&P (Signed)
History and Physical    Norton PastelFranklin D Marinello QMV:[id number] DOB: 09/13/1971 DOA: 10/31/2016  PCP: Lilyan PuntScott Luking, MD Patient coming from: Home  Chief Complaint: CP  HPI: Norton PastelFranklin D Vandoren is a 46 y.o. male with medical history significant of  anxiety and hyperlipidemia. History of heart attacks in the family. Patient reports 4 day history of intermittent chest pain. Exertional. Associated with shortness of breath but denies palpitations, diaphoresis, nausea, vomiting, radiation of the pain to neck or arm, syncope, fevers, cough, abdominal pain, nausea. Episodes seem to come on with greater frequency. Pain is substernal with radiation to each shoulder. Relief with rest. Most recent episode started the night prior to admission when patient was feeding his dog. Patient went to rest as he has before and pain did not resolve so he came to the emergency room. Denies any recent lower extremity swelling, orthopnea. Patient has used statin in the past due to his hyperlipidemia but did not tolerate it well. Patient continues to smoke 1 pack a day.   ED Course: concern for NSTEMI and cardiology was called for cath. Pt started on Heparin and nitro.    Review of Systems: As per HPI otherwise 10 point review of systems negative.   Ambulatory Status:no restrictions  Past Medical History:  Diagnosis Date  . GAD (generalized anxiety disorder)   . Hyperlipidemia     Past Surgical History:  Procedure Laterality Date  . NO PAST SURGERIES      Social History   Social History  . Marital status: Single    Spouse name: N/A  . Number of children: N/A  . Years of education: N/A   Occupational History  . Not on file.   Social History Main Topics  . Smoking status: Current Every Day Smoker    Packs/day: 1.00    Types: Cigarettes    Start date: 09/28/1989  . Smokeless tobacco: Former NeurosurgeonUser    Types: Chew  . Alcohol use No  . Drug use: No  . Sexual activity: Not on file   Other Topics Concern  . Not  on file   Social History Narrative  . No narrative on file    Allergies  Allergen Reactions  . Bactrim [Sulfamethoxazole-Trimethoprim] Other (See Comments)    Felt Drunk  . Lipitor [Atorvastatin] Other (See Comments)    Stiffness in joints  . Pravastatin     stiffness    Family History  Problem Relation Age of Onset  . Hypertension Mother   . Heart attack Father     Prior to Admission medications   Medication Sig Start Date End Date Taking? Authorizing Provider  ALPRAZolam (XANAX) 0.5 MG tablet TAKE  (1)  TABLET  FOUR TIMES DAILY AS NEEDED. Patient taking differently: Take 0.5 mg by mouth. TAKE  (1)  TABLET  FOUR TIMES DAILY AS NEEDED FOR ANXIETY 07/24/16  Yes Babs SciaraScott A Luking, MD  ibuprofen (ADVIL,MOTRIN) 200 MG tablet Take 400 mg by mouth every 6 (six) hours as needed for mild pain or moderate pain.   Yes Historical Provider, MD  sertraline (ZOLOFT) 100 MG tablet Take 1 tablet (100 mg total) by mouth daily. 07/24/16  Yes Babs SciaraScott A Luking, MD  buPROPion (WELLBUTRIN SR) 150 MG 12 hr tablet Take 1 tablet (150 mg total) by mouth 2 (two) times daily. Patient not taking: Reported on 10/31/2016 07/24/16   Babs SciaraScott A Luking, MD    Physical Exam: Vitals:   10/31/16 0745 10/31/16 0800 10/31/16 0815 10/31/16 0830  BP: 134/90 137/81  128/80 131/92  Pulse: 72 71 70 71  Resp: 14 18 19 15   Temp:      SpO2: 95% 96% 96% 95%     General:  Appears calm and comfortable Eyes:  PERRL, EOMI, normal lids, iris ENT:  grossly normal hearing, lips & tongue, mmm Neck:  no LAD, masses or thyromegaly Cardiovascular:  RRR, no m/r/g. No LE edema.  Respiratory:  CTA bilaterally, no w/r/r. Normal respiratory effort. Abdomen:  soft, ntnd, NABS Skin:  no rash or induration seen on limited exam Musculoskeletal:  grossly normal tone BUE/BLE, good ROM, no bony abnormality Psychiatric:  grossly normal mood and affect, speech fluent and appropriate, AOx3 Neurologic:  CN 2-12 grossly intact, moves all extremities  in coordinated fashion, sensation intact  Labs on Admission: I have personally reviewed following labs and imaging studies  CBC:  Recent Labs Lab 10/30/16 2232  WBC 9.5  HGB 15.5  HCT 44.2  MCV 89.7  PLT 304   Basic Metabolic Panel:  Recent Labs Lab 10/30/16 2232  NA 140  K 3.6  CL 100*  CO2 30  GLUCOSE 134*  BUN 9  CREATININE 1.06  CALCIUM 10.0   GFR: CrCl cannot be calculated (Unknown ideal weight.). Liver Function Tests: No results for input(s): AST, ALT, ALKPHOS, BILITOT, PROT, ALBUMIN in the last 168 hours. No results for input(s): LIPASE, AMYLASE in the last 168 hours. No results for input(s): AMMONIA in the last 168 hours. Coagulation Profile: No results for input(s): INR, PROTIME in the last 168 hours. Cardiac Enzymes: No results for input(s): CKTOTAL, CKMB, CKMBINDEX, TROPONINI in the last 168 hours. BNP (last 3 results) No results for input(s): PROBNP in the last 8760 hours. HbA1C: No results for input(s): HGBA1C in the last 72 hours. CBG: No results for input(s): GLUCAP in the last 168 hours. Lipid Profile: No results for input(s): CHOL, HDL, LDLCALC, TRIG, CHOLHDL, LDLDIRECT in the last 72 hours. Thyroid Function Tests: No results for input(s): TSH, T4TOTAL, FREET4, T3FREE, THYROIDAB in the last 72 hours. Anemia Panel: No results for input(s): VITAMINB12, FOLATE, FERRITIN, TIBC, IRON, RETICCTPCT in the last 72 hours. Urine analysis: No results found for: COLORURINE, APPEARANCEUR, LABSPEC, PHURINE, GLUCOSEU, HGBUR, BILIRUBINUR, KETONESUR, PROTEINUR, UROBILINOGEN, NITRITE, LEUKOCYTESUR  Creatinine Clearance: CrCl cannot be calculated (Unknown ideal weight.).  Sepsis Labs: @LABRCNTIP (procalcitonin:4,lacticidven:4) )No results found for this or any previous visit (from the past 240 hour(s)).   Radiological Exams on Admission: Dg Chest 2 View  Result Date: 10/30/2016 CLINICAL DATA:  Left-sided chest pain and hypertension. EXAM: CHEST  2 VIEW  COMPARISON:  03/09/2011 FINDINGS: Normal heart size and pulmonary vascularity. No focal airspace disease or consolidation in the lungs. No blunting of costophrenic angles. No pneumothorax. Mediastinal contours appear intact. Linear scarring in the left mid lung. No change since prior study. IMPRESSION: No active cardiopulmonary disease. Electronically Signed   By: Burman Nieves M.D.   On: 10/30/2016 22:49    EKG: Independently reviewed. Sinus. ST depression in anterior leads  Assessment/Plan Active Problems:   Hyperlipidemia   Chest pain   Depression with anxiety   Tobacco use   NSTEMI (non-ST elevated myocardial infarction) (HCC)   NSTEMI: cards following and planning to take pt to cath - Continue Nitro/Heparin - Cath per cards  HLD: - repeat lipid studies - consider pravastatin and CoQ10 - CMET  Depression/Anxiety: - continue zoloft and xanax - Ativan IV prn until taking PO  Tobacco: 1ppd w/ 20+PY history - Nicotine patch   DVT prophylaxis: Heparin  full dose - SCD after cath  Code Status: full  Family Communication: mother and others  Disposition Plan: pending cath by cards  Consults called: Cards  Admission status: inpt    Marisa Hufstetler J MD [facility]  If 7PM-7AM, please contact night-coverage [email or web address] Password [email or web address]  10/31/2016, 8:50 AM

## 2016-11-01 ENCOUNTER — Inpatient Hospital Stay (HOSPITAL_COMMUNITY): Payer: Self-pay

## 2016-11-01 ENCOUNTER — Telehealth: Payer: Self-pay

## 2016-11-01 ENCOUNTER — Encounter (HOSPITAL_COMMUNITY): Payer: Self-pay | Admitting: Cardiovascular Disease

## 2016-11-01 DIAGNOSIS — R072 Precordial pain: Secondary | ICD-10-CM

## 2016-11-01 DIAGNOSIS — Z955 Presence of coronary angioplasty implant and graft: Secondary | ICD-10-CM

## 2016-11-01 DIAGNOSIS — I10 Essential (primary) hypertension: Secondary | ICD-10-CM

## 2016-11-01 DIAGNOSIS — F418 Other specified anxiety disorders: Secondary | ICD-10-CM

## 2016-11-01 DIAGNOSIS — Z720 Tobacco use: Secondary | ICD-10-CM

## 2016-11-01 DIAGNOSIS — E784 Other hyperlipidemia: Secondary | ICD-10-CM

## 2016-11-01 DIAGNOSIS — I214 Non-ST elevation (NSTEMI) myocardial infarction: Principal | ICD-10-CM

## 2016-11-01 LAB — COMPREHENSIVE METABOLIC PANEL
ALBUMIN: 3.5 g/dL (ref 3.5–5.0)
ALK PHOS: 87 U/L (ref 38–126)
ALT: 40 U/L (ref 17–63)
ANION GAP: 9 (ref 5–15)
AST: 29 U/L (ref 15–41)
BILIRUBIN TOTAL: 0.3 mg/dL (ref 0.3–1.2)
BUN: 9 mg/dL (ref 6–20)
CALCIUM: 8.5 mg/dL — AB (ref 8.9–10.3)
CO2: 24 mmol/L (ref 22–32)
CREATININE: 1.06 mg/dL (ref 0.61–1.24)
Chloride: 104 mmol/L (ref 101–111)
GFR calc Af Amer: >60 mL/min (ref >60)
GFR calc non Af Amer: >60 mL/min (ref >60)
GLUCOSE: 88 mg/dL (ref 65–99)
Potassium: 3.5 mmol/L (ref 3.5–5.1)
SODIUM: 137 mmol/L (ref 135–145)
TOTAL PROTEIN: 6.3 g/dL — AB (ref 6.5–8.1)

## 2016-11-01 LAB — PLATELET COUNT: Platelets: 313 10*3/uL (ref 150–400)

## 2016-11-01 LAB — ECHOCARDIOGRAM COMPLETE: Weight: 3499.14 oz

## 2016-11-01 MED ORDER — ATORVASTATIN CALCIUM 20 MG PO TABS: 20.0000 mg | ORAL_TABLET | Freq: Every day | ORAL | 0 refills | Status: DC

## 2016-11-01 MED ORDER — HEART ATTACK BOUNCING BOOK
Freq: Once | Status: AC
  Administered 2016-11-01: 05:00:00
  Filled 2016-11-01: qty 1

## 2016-11-01 MED ORDER — NICOTINE 21 MG/24HR TD PT24: 21.0000 mg | MEDICATED_PATCH | Freq: Every day | TRANSDERMAL | 0 refills | Status: DC

## 2016-11-01 MED ORDER — LOSARTAN POTASSIUM 50 MG PO TABS
50.0000 mg | ORAL_TABLET | Freq: Every day | ORAL | Status: DC
  Administered 2016-11-01: 50 mg via ORAL
  Filled 2016-11-01: qty 1

## 2016-11-01 MED ORDER — EZETIMIBE 10 MG PO TABS: 10.0000 mg | ORAL_TABLET | Freq: Every day | ORAL | Status: DC

## 2016-11-01 MED ORDER — LISINOPRIL 5 MG PO TABS
5.0000 mg | ORAL_TABLET | Freq: Every day | ORAL | Status: DC
  Filled 2016-11-01: qty 1

## 2016-11-01 MED ORDER — LOSARTAN POTASSIUM 50 MG PO TABS: 50.0000 mg | ORAL_TABLET | Freq: Every day | ORAL | 0 refills | Status: DC

## 2016-11-01 MED ORDER — EZETIMIBE 10 MG PO TABS
10.0000 mg | ORAL_TABLET | Freq: Every day | ORAL | Status: DC
  Administered 2016-11-01: 10 mg via ORAL
  Filled 2016-11-01: qty 1

## 2016-11-01 MED ORDER — EZETIMIBE 10 MG PO TABS: 10.0000 mg | ORAL_TABLET | Freq: Every day | ORAL | 0 refills | Status: DC

## 2016-11-01 MED ORDER — TICAGRELOR 90 MG PO TABS: 90.0000 mg | ORAL_TABLET | Freq: Two times a day (BID) | ORAL | 0 refills | Status: DC

## 2016-11-01 MED ORDER — METOPROLOL TARTRATE 50 MG PO TABS: 50.0000 mg | ORAL_TABLET | Freq: Two times a day (BID) | ORAL | 0 refills | Status: DC

## 2016-11-01 MED ORDER — METOPROLOL TARTRATE 25 MG PO TABS
50.0000 mg | ORAL_TABLET | Freq: Two times a day (BID) | ORAL | Status: DC
  Administered 2016-11-01: 11:00:00 50 mg via ORAL

## 2016-11-01 MED ORDER — PERFLUTREN LIPID MICROSPHERE
1.0000 mL | INTRAVENOUS | Status: AC | PRN
  Administered 2016-11-01: 08:00:00 2 mL via INTRAVENOUS
  Filled 2016-11-01: qty 10

## 2016-11-01 MED ORDER — ATORVASTATIN CALCIUM 20 MG PO TABS: 20.0000 mg | ORAL_TABLET | Freq: Every day | ORAL | Status: DC

## 2016-11-01 MED ORDER — NITROGLYCERIN 0.4 MG SL SUBL: 0.4000 mg | SUBLINGUAL_TABLET | SUBLINGUAL | Status: DC | PRN

## 2016-11-01 MED ORDER — NITROGLYCERIN 0.4 MG SL SUBL: 0.4000 mg | SUBLINGUAL_TABLET | SUBLINGUAL | 0 refills | Status: DC | PRN

## 2016-11-01 MED FILL — ATORVASTATIN 20 MG TABLET: 20 | 30 days supply | Qty: 30 | Fill #0

## 2016-11-01 MED FILL — BRILINTA 90 MG TABLET: 90 | 30 days supply | Qty: 60 | Fill #0

## 2016-11-01 MED FILL — NITROGLYCERIN 0.4 MG TAB SL: 0.4 | 13 days supply | Qty: 25 | Fill #0

## 2016-11-01 MED FILL — METOPROLOL TARTRATE 50 MG T: 50 | 30 days supply | Qty: 60 | Fill #0

## 2016-11-01 MED FILL — EZETIMIBE 10 MG TABLET: 10 | 30 days supply | Qty: 30 | Fill #0

## 2016-11-01 MED FILL — LOSARTAN POTASSIUM 50 MG TA: 50 | 30 days supply | Qty: 30 | Fill #0

## 2016-11-01 NOTE — Progress Notes (Signed)
Hematoma noted at right radial site. 3cm hardened area proximal to insertion site. Pressure held and hematoma resolved into a bruise. Will continue to monitor and manage. Pt has no complaints at this time.

## 2016-11-01 NOTE — Care Management Note (Signed)
Case Management Note  Patient Details  Name: Bruce King MRN: [id number] Date of Birth: 02/05/1971  Subjective/Objective:    Patient is s/p coronary stent intervention, pta indep, he has no insurance, he is self employed, NCM gave him the 30 day savings card and [facility].  [facility], they have a brilinta program there to ast patient when he goes on 2/15.   Also NCM gave patient Match Letter for assistant with other medications. MD will print paper scripts ,[facility] and patient will pick up meds there.                 Action/Plan:   Expected Discharge Date:                  Expected Discharge Plan:  Home/Self Care  In-House Referral:     Discharge planning Services  CM Consult, Indigent Health Clinic, Medication Assistance  Post Acute Care Choice:    Choice offered to:     DME Arranged:    DME Agency:     HH Arranged:    HH Agency:     Status of Service:  Completed, signed off  If discussed at [employer] Length of Stay Meetings, dates discussed:    Additional Comments:  Leone Havenaylor, Soren Lazarz Clinton, RN 11/01/2016, 9:27 AM

## 2016-11-01 NOTE — Progress Notes (Signed)
CARDIAC REHAB PHASE I   PRE:  Rate/Rhythm: 76 SR  BP:  Supine: 155/89, 174/99  Sitting:   Standing:    SaO2:   MODE:  Ambulation: 800 ft   POST:  Rate/Rhythm: 97SR  BP:  Supine: 186/109, 178/106         After rest 182/102   Sitting:   Standing:    SaO2: 99%RA 0930-1045 Pt walked 800 ft with steady gait and no CP. Tolerated well. MI education completed with pt who voiced understanding. Stressed importance of brilinta with stent. He has brilinta card. Reviewed NTG use,MI restrictons, ex ed, risk factors, smoking cessation and CRP 2, heart healthy diet choices. Discussed smoking cessation and gave handout. Pt did not want fake cigarette. He has quit once before cold [place] for a year. Discussed CRP 2 and will refer to [place]. BP elevated and Dr Tresa EndoKelly aware.   Luetta Nuttingharlene Treniece Holsclaw, RN BSN  11/01/2016 10:51 AM

## 2016-11-01 NOTE — Telephone Encounter (Signed)
[facility]  Patient understands to follow up with provider Harriet PhoK Lawrence NP on 11/09/16 at 2:10 [facility]. Patient understands discharge instructions? yes Patient understands medications and regiment? yes Patient understands to bring all medications to this visit? yes  Pt acknowledged  fu

## 2016-11-01 NOTE — Progress Notes (Signed)
Patients blood pressure continues to be high. States he takes xanax 4 times a day. Complains of numbness in left cheek and lower ear. Will give prn xanax and recheck. No other symptoms per patient at this time. Instructed to call if any other symptoms occur or numbness increases.

## 2016-11-01 NOTE — Telephone Encounter (Signed)
-----   Message from Dyane Dustmanerry L Goins sent at 11/01/2016 11:59 AM EST ----- Regarding: TCM  To be d/c'd 11/02/15.Marland Kitchen.  Thanks, Aurther Lofterry

## 2016-11-01 NOTE — Progress Notes (Signed)
Patient transferred to echo lab for echocardiogram via wheelchair with transporter.

## 2016-11-01 NOTE — Progress Notes (Signed)
  Echocardiogram 2D Echocardiogram with Definity has been performed.  Cathie BeamsGREGORY, Wylodean Shimmel 11/01/2016, 9:23 AM

## 2016-11-01 NOTE — Progress Notes (Signed)
TR BAND REMOVAL  LOCATION:    right radial  DEFLATED PER PROTOCOL:    Yes.    TIME BAND OFF / DRESSING APPLIED:    0200     SITE UPON ARRIVAL:    See flowsheet data  SITE AFTER BAND REMOVAL:    Level 1  CIRCULATION SENSATION AND MOVEMENT:    Within Normal Limits   Yes.    COMMENTS:   Radial site teaching reinforced; teach back completed.

## 2016-11-01 NOTE — Progress Notes (Addendum)
Progress Note  Patient Name: Bruce King Date of Encounter: 11/01/2016  Primary Cardiologist: New  Subjective   Feeling well this morning. No further chest pain.   Inpatient Medications    Scheduled Meds: . aspirin  81 mg Oral Daily  . metoprolol tartrate  25 mg Oral BID  . nicotine  21 mg Transdermal Daily  . sertraline  100 mg Oral Daily  . sodium chloride flush  3 mL Intravenous Q12H  . sodium chloride flush  3 mL Intravenous Q12H  . ticagrelor  90 mg Oral BID   Continuous Infusions: . nitroGLYCERIN Stopped (11/01/16 0330)   PRN Meds:    Vital Signs    Vitals:   10/31/16 2300 11/01/16 0527 11/01/16 0721 11/01/16 0734  BP: (!) 154/81 129/87  (!) 155/89  Pulse: 64 65  65  Resp: 11 13 13 14   Temp:  98.5 F (36.9 C)  98.7 F (37.1 C)  TempSrc:  Oral  Oral  SpO2: 98% 97%  98%  Weight:  218 lb 11.1 oz (99.2 kg)      Intake/Output Summary (Last 24 hours) at 11/01/16 0907 Last data filed at 11/01/16 0636  Gross per 24 hour  Intake          1070.98 ml  Output             1350 ml  Net          -279.02 ml   Filed Weights   10/31/16 0900 11/01/16 0527  Weight: 220 lb 14.4 oz (100.2 kg) 218 lb 11.1 oz (99.2 kg)    Telemetry    SR - Personally Reviewed  ECG    SR with  q waves in inferior leads? - Personally Reviewed  Physical Exam   GEN: Pleasant WM, No acute distress.   Neck: No JVD Cardiac: RRR, no murmurs, rubs, or gallops.  Respiratory: Clear to auscultation bilaterally. GI: Soft, nontender, non-distended  MS: No edema; No deformity. Right wrist with mild bruising and hematoma, no bruit. Neuro:  Nonfocal  Psych: Normal affect   Labs    Chemistry  Recent Labs Lab 10/30/16 2232 11/01/16 0203  NA 140 137  K 3.6 3.5  CL 100* 104  CO2 30 24  GLUCOSE 134* 88  BUN 9 9  CREATININE 1.06 1.06  CALCIUM 10.0 8.5*  PROT  --  6.3*  ALBUMIN  --  3.5  AST  --  29  ALT  --  40  ALKPHOS  --  87  BILITOT  --  0.3  GFRNONAA >60 >60    GFRAA >60 >60  ANIONGAP 10 9     Hematology  Recent Labs Lab 10/30/16 2232 11/01/16 0203  WBC 9.5  --   RBC 4.93  --   HGB 15.5  --   HCT 44.2  --   MCV 89.7  --   MCH 31.4  --   MCHC 35.1  --   RDW 13.2  --   PLT 304 313    Cardiac EnzymesNo results for input(s): TROPONINI in the last 168 hours.   Recent Labs Lab 10/30/16 2249 10/31/16 0453  TROPIPOC 0.04 0.14*     BNPNo results for input(s): BNP, PROBNP in the last 168 hours.   DDimer No results for input(s): DDIMER in the last 168 hours.   Lipid Panel     Component Value Date/Time   CHOL 217 (H) 02/02/2016 0957   TRIG 307 (H) 02/02/2016 0957   HDL  23 (L) 02/02/2016 0957   CHOLHDL 9.4 (H) 02/02/2016 0957   CHOLHDL 10.2 09/09/2014 0903   VLDL 68 (H) 09/09/2014 0903   LDLCALC 133 (H) 02/02/2016 0957      Radiology    Dg Chest 2 View  Result Date: 10/30/2016 CLINICAL DATA:  Left-sided chest pain and hypertension. EXAM: CHEST  2 VIEW COMPARISON:  03/09/2011 FINDINGS: Normal heart size and pulmonary vascularity. No focal airspace disease or consolidation in the lungs. No blunting of costophrenic angles. No pneumothorax. Mediastinal contours appear intact. Linear scarring in the left mid lung. No change since prior study. IMPRESSION: No active cardiopulmonary disease. Electronically Signed   By: Burman Nieves M.D.   On: 10/30/2016 22:49    Cardiac Studies   LHC: 10/31/16    Mid RCA lesion, 20 %stenosed.  Ost RPDA lesion, 30 %stenosed.  2nd RPLB lesion, 40 %stenosed.  Ost 2nd Mrg to 2nd Mrg lesion, 30 %stenosed.  Ost 2nd Diag to 2nd Diag lesion, 30 %stenosed.  Ost 1st Sept lesion, 70 %stenosed.  Ost 1st Diag lesion, 80 %stenosed.  The left ventricular systolic function is normal.  LV end diastolic pressure is mildly elevated.  The left ventricular ejection fraction is 50-55% by visual estimate.  A STENT RESOLUTE ONYX 2.25X12 drug eluting stent was successfully placed.  Dist LAD lesion, 70  %stenosed.  Post intervention, there is a 0% residual stenosis.  A STENT RESOLUTE ONYX 2.25X15 drug eluting stent was successfully placed, and overlaps previously placed stent.  Mid LAD to Dist LAD lesion, 70 %stenosed.  Post intervention, there is a 0% residual stenosis.  A STENT RESOLUTE ONYX 2.5X30 drug eluting stent was successfully placed.  Mid LAD lesion, 95 %stenosed.  Post intervention, there is a 0% residual stenosis.   1. Severe one-vessel coronary artery disease with multiple areas of stenosis in the mid and distal LAD. The vessel is moderately calcified overall. 2. Normal LV systolic function and mildly elevated left ventricular end-diastolic pressure. 3. Successful angioplasty and 3 drug-eluting stent placements to the LAD. There is one distal LAD stent and 2 overlapped stents in the midsegment.   Recommendations: Dual antiplatelet therapy for at least one year. Aggressive treatment of risk factors and smoking cessation. I started Aggrastat and will continue for 12 hours due to thrombus formation in the mid LAD after development of distal edge dissection. This was an overall difficult procedure due to diffuse disease in the LAD and moderate calcifications.  Diagnostic Diagram     Post-Intervention Diagram        Patient Profile     46 y.o. male with PMH of HL, GAD, and tobacco abuse who presented with exertional chest pain over the past couple of days. Trop with mild elevation, he was taken for cath yesterday.   Assessment & Plan    1. NSTEMI: Trop 0.04>>0.14. Given his reports of exertional chest pain, hx of tobacco use and HL (not currently on statin as he is intolerant) and strong family hx recommend cardiac catheterization.  -- underwent cardiac catheterization yesterday with Dr. Kirke Corin with DES x3 to LAD, with 80% lesion 1st diag, 70% lesion 1st sept with mild diffuse diease in other vessels.  -- Had Aggrastat due to thrombus formation in the mid LAD after  development of distal edge dissection. -- DAPT with ASA and Brilinta -- Echo pending  2. HL: Reports he has been on multiple statins in the past, but unable to tolerate due to myalgias.  -- Last lipid panel 5/17,  LDL 133, HDL 23, cholesterol 217. Will add Zetia, and referral to lipid clinic for PCSK9 inhibitors.   3. GAD: Takes Xanax daily, followed by PCP  4. Tobacco Abuse: 20 py hx, cessation strongly encouraged.   5. HTN: States he has been borderline HTN for the past couple of years, was tried on blood pressure medications and dropped his BP.  -- Metoprolol added post cath. Will add low dose ACEi   Signed, Laverda PageLindsay Roberts, NP  11/01/2016, 9:07 AM    Patient seen and examined. Agree with assessment and plan. I have reviewed angio and discussed with patient. S/P DES stenting to LAD and has concomitant CAD as noted.  BP elevated. Will start losartan 50 mg and increase metoprolol to 50 mg bid.  I discussed his lipids. I am not certain if he is truly intolerant to statins.  In this past he developed isolated R shoulder discomfort while on lipitor and attributed this to the drug, but he was later found out to have a pinched nerve.  Will start atorvastatin at 20 mg low dose as well as zetia with target LDL < 70; if cannot tolerate, then PCSK9 inhibition. Probably can dc later today.    Lennette Biharihomas A. Kelly, MD, [facility] 11/01/2016 10:36 AM

## 2016-11-01 NOTE — Discharge Summary (Signed)
Physician Discharge Summary  Norton PastelFranklin D Enloe ZOX:[id number] DOB: 06/21/1971 DOA: 10/31/2016  PCP: Lilyan PuntScott Luking, MD  Admit date: 10/31/2016 Discharge date: 11/01/2016  Time spent: 35 minutes  Recommendations for Outpatient Follow-up: NSTEMI: -S/P left heart cardiac catheterization with multiple stent placement see results below. -Follow up with NP  Joni ReiningKathryn [facility] on 2/16 -Medication per cardiology  HLD: - Lipitor 20 mg daily -Zetia 10mg  daily  Depression/Anxiety -Restart home medication  Tobacco abuse:  -1ppd w/ 20+PY history - Counseled on absolute need to discontinue tobacco. -Nicotine patch     Discharge Diagnoses:  Active Problems:   Hyperlipidemia   Chest pain   Depression with anxiety   Tobacco use   NSTEMI (non-ST elevated myocardial infarction) [facility](HCC)   Discharge Condition: Stable  Diet recommendation: Heart healthy  Filed Weights   10/31/16 0900 11/01/16 0527  Weight: 100.2 kg (220 lb 14.4 oz) 99.2 kg (218 lb 11.1 oz)    History of present illness:  46 y.o. WM PMHx Anxiety, HLD, Tobacco abuse   History of heart attacks in the family. Patient reports 4 day history of intermittent chest pain. Exertional. Associated with shortness of breath but denies palpitations, diaphoresis, nausea, vomiting, radiation of the pain to neck or arm, syncope, fevers, cough, abdominal pain, nausea. Episodes seem to come on with greater frequency. Pain is substernal with radiation to each shoulder. Relief with rest. Most recent episode started the night prior to admission when patient was feeding his dog. Patient went to rest as he has before and pain did not resolve so he came to the emergency room. Denies any recent lower extremity swelling, orthopnea. Patient has used statin in the past due to his hyperlipidemia but did not tolerate it well. Patient continues to smoke 1 pack a day. Patient with chest pain evaluated by cardiology left heart cardiac catheterization  performed showing multiple areas of requiring stenting. Cardiology now deems patient stable for discharge    Procedures: 2/7/ Lt Heart Cardiac Catheterization:-Severe one-vessel coronary artery disease with multiple areas of stenosis in the mid and distal LAD. T -LVEF= 50-55% -3 drug-eluting stent placements to the LAD. + one distal LAD stent + 2 overlapped stents in the midsegment.   [facility]   Antibiotics Anti-infectives    None       Discharge Exam: Vitals:   11/01/16 1159 11/01/16 1332 11/01/16 1350 11/01/16 1413  BP: (!) 157/105 (!) 165/106 (!) 151/94 (!) 146/90  Pulse:      Resp:   (!) 23 (!) 22  Temp:      TempSrc:      SpO2:      Weight:        General: A/O 4, NAD. Cardiovascular: Rhythm and rate, negative murmurs rubs gallops, normal S1/S2 Respiratory: Clear to auscultation bilateral  Discharge Instructions  Discharge Instructions    AMB Referral to Cardiac Rehabilitation - Phase II    Complete by:  As directed    Diagnosis:   Coronary Stents NSTEMI     Amb Referral to Cardiac Rehabilitation    Complete by:  As directed    Diagnosis:   NSTEMI Coronary Stents       Allergies as of 11/01/2016      Reactions   Bactrim [sulfamethoxazole-trimethoprim] Other (See Comments)   Felt Drunk   Lipitor [atorvastatin] Other (See Comments)   Stiffness in joints   Pravastatin    stiffness      Medication List    STOP taking these medications  buPROPion 150 MG 12 hr tablet Commonly known as:  WELLBUTRIN SR   ibuprofen 200 MG tablet Commonly known as:  ADVIL,MOTRIN     TAKE these medications   ALPRAZolam 0.5 MG tablet Commonly known as:  XANAX TAKE  (1)  TABLET  FOUR TIMES DAILY AS NEEDED. What changed:  how much to take  how to take this  additional instructions   atorvastatin 20 MG tablet Commonly known as:  LIPITOR Take 1 tablet (20 mg total) by mouth daily at 6 PM.   ezetimibe 10 MG tablet Commonly known as:   ZETIA Take 1 tablet (10 mg total) by mouth daily. Start taking on:  11/02/2016   losartan 50 MG tablet Commonly known as:  COZAAR Take 1 tablet (50 mg total) by mouth daily. Start taking on:  11/02/2016   metoprolol 50 MG tablet Commonly known as:  LOPRESSOR Take 1 tablet (50 mg total) by mouth 2 (two) times daily.   nicotine 21 mg/24hr patch Commonly known as:  NICODERM CQ - dosed in mg/24 hours Place 1 patch (21 mg total) onto the skin daily. Start taking on:  11/02/2016   sertraline 100 MG tablet Commonly known as:  ZOLOFT Take 1 tablet (100 mg total) by mouth daily.   ticagrelor 90 MG Tabs tablet Commonly known as:  BRILINTA Take 1 tablet (90 mg total) by mouth 2 (two) times daily.      Allergies  Allergen Reactions  . Bactrim [Sulfamethoxazole-Trimethoprim] Other (See Comments)    Felt Drunk  . Lipitor [Atorvastatin] Other (See Comments)    Stiffness in joints  . Pravastatin     stiffness   Follow-up Information    House, Eugenio Hoes, FNP Follow up on 11/08/2016.   Specialty:  Nurse Practitioner Why:  10 am for hospital follow up Contact information: [address] [place] [address] [phone]        Joni Reining, NP Follow up on 11/09/2016.   Specialties:  Nurse Practitioner, Radiology, Cardiology Why:  at 2:00pm for your follow up appt.  Contact information: [address]  [place] [address] [phone]            The results of significant diagnostics from this hospitalization (including imaging, microbiology, ancillary and laboratory) are listed below for reference.    Significant Diagnostic Studies: Dg Chest 2 View  Result Date: 10/30/2016 CLINICAL DATA:  Left-sided chest pain and hypertension. EXAM: CHEST  2 VIEW COMPARISON:  03/09/2011 FINDINGS: Normal heart size and pulmonary vascularity. No focal airspace disease or consolidation in the lungs. No blunting of costophrenic angles. No pneumothorax. Mediastinal contours appear intact. Linear  scarring in the left mid lung. No change since prior study. IMPRESSION: No active cardiopulmonary disease. Electronically Signed   By: Burman Nieves M.D.   On: 10/30/2016 22:49    Microbiology: No results found for this or any previous visit (from the past 240 hour(s)).   Labs: Basic Metabolic Panel:  Recent Labs Lab 10/30/16 2232 11/01/16 0203  NA 140 137  K 3.6 3.5  CL 100* 104  CO2 30 24  GLUCOSE 134* 88  BUN 9 9  CREATININE 1.06 1.06  CALCIUM 10.0 8.5*   Liver Function Tests:  Recent Labs Lab 11/01/16 0203  AST 29  ALT 40  ALKPHOS 87  BILITOT 0.3  PROT 6.3*  ALBUMIN 3.5   No results for input(s): LIPASE, AMYLASE in the last 168 hours. No results for input(s): AMMONIA in the last 168 hours. CBC:  Recent  Labs Lab 10/30/16 2232 11/01/16 0203  WBC 9.5  --   HGB 15.5  --   HCT 44.2  --   MCV 89.7  --   PLT 304 313   Cardiac Enzymes: No results for input(s): CKTOTAL, CKMB, CKMBINDEX, TROPONINI in the last 168 hours. BNP: BNP (last 3 results) No results for input(s): BNP in the last 8760 hours.  ProBNP (last 3 results) No results for input(s): PROBNP in the last 8760 hours.  CBG: No results for input(s): GLUCAP in the last 168 hours.     Signed:  Carolyne Littles, MD [facility] [phone] pager

## 2016-11-01 NOTE — Progress Notes (Signed)
1350 Patients blood pressure 151/94, numbness in left side of face and ear has resolved and patient has no other complaints at this time.  1416 Blood pressure 146/90. Patient has no complaints. 1430 135/81 blood pressure is wnls at this point. Patient verbalized understanding that taking his antianxiety medication will avoid the feeling of numbness and high blood pressure experienced this morning. Patient had reported that he had numbness in his face when he had his panic attack which initiated his use of xanax.

## 2016-11-08 NOTE — Progress Notes (Signed)
Cardiology Office Note   Date:  11/09/2016   ID:  Bruce King, DOB 03/15/1971, MRN [id number]  PCP:  Lilyan PuntScott Luking, MD  Cardiologist: Eldridge DaceVaranasi (Patient request)/  Joni ReiningKathryn Lawrence, NP   Chief Complaint  Patient presents with  . Coronary Artery Disease      History of Present Illness: Bruce King is a 46 y.o. male who presents for posthospitalization follow-up after admission for chest pain, requiring a left heart catheterization with multiple cardiovascular risk factors. The patient underwent cardiac catheterization on 10/31/2016 revealing severe one vessel coronary artery disease with multiple areas of stenosis in the mid and distal LAD.  Conclusion     Mid RCA lesion, 20 %stenosed.  Ost RPDA lesion, 30 %stenosed.  2nd RPLB lesion, 40 %stenosed.  Ost 2nd Mrg to 2nd Mrg lesion, 30 %stenosed.  Ost 2nd Diag to 2nd Diag lesion, 30 %stenosed.  Ost 1st Sept lesion, 70 %stenosed.  Ost 1st Diag lesion, 80 %stenosed.  The left ventricular systolic function is normal.  LV end diastolic pressure is mildly elevated.  The left ventricular ejection fraction is 50-55% by visual estimate.  A STENT RESOLUTE ONYX 2.25X12 drug eluting stent was successfully placed.  Dist LAD lesion, 70 %stenosed.  Post intervention, there is a 0% residual stenosis.  A STENT RESOLUTE ONYX 2.25X15 drug eluting stent was successfully placed, and overlaps previously placed stent.  Mid LAD to Dist LAD lesion, 70 %stenosed.  Post intervention, there is a 0% residual stenosis.  A STENT RESOLUTE ONYX 2.5X30 drug eluting stent was successfully placed.  Mid LAD lesion, 95 %stenosed.  Post intervention, there is a 0% residual stenosis.   1. Severe one-vessel coronary artery disease with multiple areas of stenosis in the mid and distal LAD. The vessel is moderately calcified overall. 2. Normal LV systolic function and mildly elevated left ventricular end-diastolic pressure. 3. Successful  angioplasty and 3 drug-eluting stent placements to the LAD. There is one distal LAD stent and 2 overlapped stents in the midsegment.    He was placed on dual antiplatelet therapy for one year, with risk factor modification and smoking cessation strongly advised. He comes today without any complaints of recurrent chest pain, shortness of breath, but has had some dizziness and hypotension at home. He has returned to work. He has quit smoking.  Past Medical History:  Diagnosis Date  . Anxiety   . Coronary artery disease   . Depression   . GAD (generalized anxiety disorder)   . GERD (gastroesophageal reflux disease)   . History of hiatal hernia   . Hyperlipidemia   . NSTEMI (non-ST elevated myocardial infarction) (HCC) 10/30/2016    Past Surgical History:  Procedure Laterality Date  . CORONARY ANGIOPLASTY WITH STENT PLACEMENT  10/31/2016   "3 stents"  . CORONARY STENT INTERVENTION N/A 10/31/2016   Procedure: Coronary Stent Intervention;  Surgeon: Iran OuchMuhammad A Arida, MD;  Location: [facility] INVASIVE CV LAB;  Service: Cardiovascular;  Laterality: N/A;  . FRACTURE SURGERY    . LEFT HEART CATH AND CORONARY ANGIOGRAPHY N/A 10/31/2016   Procedure: Left Heart Cath and Coronary Angiography;  Surgeon: Iran OuchMuhammad A Arida, MD;  Location: [facility] INVASIVE CV LAB;  Service: Cardiovascular;  Laterality: N/A;  . ORIF TIBIA & FIBULA FRACTURES Right ~ 05/1996   "titanium rod"     Current Outpatient Prescriptions  Medication Sig Dispense Refill  . ALPRAZolam (XANAX) 0.5 MG tablet TAKE  (1)  TABLET  FOUR TIMES DAILY AS NEEDED. (Patient taking differently: Take 0.5 mg by  mouth. TAKE  (1)  TABLET  FOUR TIMES DAILY AS NEEDED FOR ANXIETY) 120 tablet 5  . atorvastatin (LIPITOR) 20 MG tablet Take 1 tablet (20 mg total) by mouth daily at 6 PM. 30 tablet 3  . ezetimibe (ZETIA) 10 MG tablet Take 1 tablet (10 mg total) by mouth daily. 30 tablet 3  . losartan (COZAAR) 50 MG tablet Take 1 tablet (50 mg total) by mouth daily. 30  tablet 3  . metoprolol (LOPRESSOR) 50 MG tablet Take 1 tablet (50 mg total) by mouth 2 (two) times daily. 60 tablet 3  . nicotine (NICODERM CQ - DOSED IN MG/24 HOURS) 21 mg/24hr patch Place 1 patch (21 mg total) onto the skin daily. 28 patch 0  . nitroGLYCERIN (NITROSTAT) 0.4 MG SL tablet Place 1 tablet (0.4 mg total) under the tongue every 5 (five) minutes as needed for chest pain. 30 tablet 0  . sertraline (ZOLOFT) 100 MG tablet Take 1 tablet (100 mg total) by mouth daily. 30 tablet 3  . ticagrelor (BRILINTA) 90 MG TABS tablet Take 1 tablet (90 mg total) by mouth 2 (two) times daily. 60 tablet 3   No current facility-administered medications for this visit.     Allergies:   Bactrim [sulfamethoxazole-trimethoprim]; Lipitor [atorvastatin]; and Pravastatin    Social History:  The patient  reports that he quit smoking 11 days ago. His smoking use included Cigarettes. He started smoking about 27 years ago. He has a 25.00 pack-year smoking history. His smokeless tobacco use includes Chew and Snuff. He reports that he drinks about 8.4 oz of alcohol per week . He reports that he does not use drugs.   Family History:  The patient's family history includes Heart attack in his father; Hypertension in his mother.    ROS: All other systems are reviewed and negative. Unless otherwise mentioned in H&P    PHYSICAL EXAM: VS:  BP 124/80   Pulse 62   Ht 6\' 1"  (1.854 m)   Wt 215 lb (97.5 kg)   SpO2 98%   BMI 28.37 kg/m  , BMI Body mass index is 28.37 kg/m. GEN: Well nourished, well developed, in no acute distress  HEENT: normal  Neck: no JVD, carotid bruits, or masses Cardiac: RRR; no murmurs, rubs, or gallops,no edema  Respiratory: Clear to auscultation bilaterally, normal work of breathing GI: soft, nontender, nondistended, + BS MS: no deformity or atrophy Cath insertion site on the right radial, well-healed significant amount of ecchymosis noted proximal to the insertion site. No pain hematoma  or bleeding. Skin: warm and dry, no rash Neuro:  Strength and sensation are intact Psych: euthymic mood, full affect   Recent Labs: 10/30/2016: Hemoglobin 15.5 11/01/2016: ALT 40; BUN 9; Creatinine, Ser 1.06; Platelets 313; Potassium 3.5; Sodium 137    Lipid Panel    Component Value Date/Time   CHOL 217 (H) 02/02/2016 0957   TRIG 307 (H) 02/02/2016 0957   HDL 23 (L) 02/02/2016 0957   CHOLHDL 9.4 (H) 02/02/2016 0957   CHOLHDL 10.2 09/09/2014 0903   VLDL 68 (H) 09/09/2014 0903   LDLCALC 133 (H) 02/02/2016 0957      Wt Readings from Last 3 Encounters:  11/09/16 215 lb (97.5 kg)  11/01/16 218 lb 11.1 oz (99.2 kg)  07/24/16 221 lb (100.2 kg)      Other studies Reviewed: Echocardiogram Left ventricle: The cavity size was normal. Wall thickness was   normal. Systolic function was normal. The estimated ejection   fraction was in  the range of 55% to 60%. Wall motion was normal;   there were no regional wall motion abnormalities. There was an   increased relative contribution of atrial contraction to   ventricular filling. Doppler parameters are consistent with   abnormal left ventricular relaxation (grade 1 diastolic   dysfunction).  ASSESSMENT AND PLAN:  1.  Coronary artery disease: Status post hospitalization in the setting of non-ST elevation MI. Patient with multiple stents to the LAD, with residual disease elsewhere. He has recently been started on dual antiplatelet therapy with Brilinta and aspirin, has had no significant side effects of bleeding or excessive bruising. He does have some bruising at his cath site which is slowly improving.  The patient denies recurrent symptoms with the exception of some mild dizziness especially with position change. He wishes to be established with Dr. Eldridge Dace in [place]. He states that his is here for him to go there and be seen. This will be arranged.  Continue secondary prevention with atorvastatin, antiplatelet therapy, and beta  blocker. He asked about resumption of sexual activity. I've advised him that he can resume this.  2. Hypertension: Blood pressure is soft here in the office, he experiences low breath pressure at home per his BP machine, running around 110-115 systolic. He is experiencing some dizziness with position change. I will decrease his Cozaar to 25 mg daily from 50 mg daily. I've given him a blood pressure recording sheet. He's been advised that if his blood pressure begins to rise and is consistently over 140/85 he is to go back on the higher dose of losartan at 50 mg daily. He will call us and let us know how his blood pressures are doing.  3. Tobacco abuse: He is not smoking since having recent admission and stent placement. He is congratulated on his lifestyle change. He's advised not to restart now that he is back in his normal routine.  4. Hypercholesterolemia: Now on statin therapy and Zetia daily. He is advised a low-cholesterol diet. He will need to have follow-up labs in 3 months.   Current medicines are reviewed at length with the patient today.    Labs/ tests ordered today include:  No orders of the defined types were placed in this encounter.    Disposition:   FU with 3 months [place] office Dr. Eldridge Dace  (patient request).  Signed, Joni Reining, NP  11/09/2016 3:16 PM    [facility] [address] [place], [place] [address] Phone: [phone]; Fax: [phone]

## 2016-11-09 ENCOUNTER — Encounter: Payer: Self-pay | Admitting: Adult Health

## 2016-11-09 ENCOUNTER — Ambulatory Visit (INDEPENDENT_AMBULATORY_CARE_PROVIDER_SITE_OTHER): Payer: Self-pay | Admitting: Adult Health

## 2016-11-09 VITALS — BP 124/80 | HR 62 | Ht 73.0 in | Wt 215.0 lb

## 2016-11-09 DIAGNOSIS — E7800 Pure hypercholesterolemia, unspecified: Secondary | ICD-10-CM

## 2016-11-09 DIAGNOSIS — Z720 Tobacco use: Secondary | ICD-10-CM

## 2016-11-09 DIAGNOSIS — I10 Essential (primary) hypertension: Secondary | ICD-10-CM

## 2016-11-09 DIAGNOSIS — I2510 Atherosclerotic heart disease of native coronary artery without angina pectoris: Secondary | ICD-10-CM

## 2016-11-09 MED ORDER — ATORVASTATIN CALCIUM 20 MG PO TABS: 20.0000 mg | ORAL_TABLET | Freq: Every day | ORAL | 3 refills | Status: DC

## 2016-11-09 MED ORDER — EZETIMIBE 10 MG PO TABS: 10.0000 mg | ORAL_TABLET | Freq: Every day | ORAL | 3 refills | Status: DC

## 2016-11-09 MED ORDER — LOSARTAN POTASSIUM 50 MG PO TABS: 50.0000 mg | ORAL_TABLET | Freq: Every day | ORAL | 3 refills | Status: DC

## 2016-11-09 MED ORDER — METOPROLOL TARTRATE 50 MG PO TABS: 50.0000 mg | ORAL_TABLET | Freq: Two times a day (BID) | ORAL | 3 refills | Status: DC

## 2016-11-09 MED ORDER — TICAGRELOR 90 MG PO TABS: 90.0000 mg | ORAL_TABLET | Freq: Two times a day (BID) | ORAL | 3 refills | Status: DC

## 2016-11-09 NOTE — Progress Notes (Signed)
Name: Bruce King    DOB: 23-Aug-1971  Age: 46 y.o.  MR#: [id number]       PCP:  Lilyan Punt, MD      Insurance: Payor: MEDICAID POTENTIAL / Plan: MEDICAID POTENTIAL / Product Type: *No Product type* /   CC:   No chief complaint on file.   VS Vitals:   11/09/16 1403  Pulse: 62  SpO2: 98%  Weight: 215 lb (97.5 kg)  Height: 6\' 1"  (1.854 m)    Weights Current Weight  11/09/16 215 lb (97.5 kg)  11/01/16 218 lb 11.1 oz (99.2 kg)  07/24/16 221 lb (100.2 kg)    Blood Pressure  BP Readings from Last 3 Encounters:  11/01/16 (!) 149/97  07/24/16 140/90  01/02/16 120/86     Admit date:  (Not on file) Last encounter with RMR:  Visit date not found   Allergy Bactrim [sulfamethoxazole-trimethoprim]; Lipitor [atorvastatin]; and Pravastatin  Current Outpatient Prescriptions  Medication Sig Dispense Refill  . ALPRAZolam (XANAX) 0.5 MG tablet TAKE  (1)  TABLET  FOUR TIMES DAILY AS NEEDED. (Patient taking differently: Take 0.5 mg by mouth. TAKE  (1)  TABLET  FOUR TIMES DAILY AS NEEDED FOR ANXIETY) 120 tablet 5  . atorvastatin (LIPITOR) 20 MG tablet Take 1 tablet (20 mg total) by mouth daily at 6 PM. 30 tablet 0  . ezetimibe (ZETIA) 10 MG tablet Take 1 tablet (10 mg total) by mouth daily. 30 tablet 0  . losartan (COZAAR) 50 MG tablet Take 1 tablet (50 mg total) by mouth daily. 30 tablet 0  . metoprolol tartrate (LOPRESSOR) 50 MG tablet Take 1 tablet (50 mg total) by mouth 2 (two) times daily. 60 tablet 0  . nicotine (NICODERM CQ - DOSED IN MG/24 HOURS) 21 mg/24hr patch Place 1 patch (21 mg total) onto the skin daily. 28 patch 0  . nitroGLYCERIN (NITROSTAT) 0.4 MG SL tablet Place 1 tablet (0.4 mg total) under the tongue every 5 (five) minutes as needed for chest pain. 30 tablet 0  . sertraline (ZOLOFT) 100 MG tablet Take 1 tablet (100 mg total) by mouth daily. 30 tablet 3  . ticagrelor (BRILINTA) 90 MG TABS tablet Take 1 tablet (90 mg total) by mouth 2 (two) times daily. 60 tablet 0   No  current facility-administered medications for this visit.     Discontinued Meds:   There are no discontinued medications.  Patient Active Problem List   Diagnosis Date Noted  . Status post coronary artery stent placement   . Tobacco abuse   . Depression with anxiety 10/31/2016  . Tobacco use 10/31/2016  . NSTEMI (non-ST elevated myocardial infarction) (HCC) 10/31/2016  . Chest pain 09/28/2014  . Generalized anxiety disorder 03/02/2013  . Hyperlipidemia 03/02/2013    LABS    Component Value Date/Time   NA 137 11/01/2016 0203   NA 140 10/30/2016 2232   NA 135 (L) 08/22/2014 1256   K 3.5 11/01/2016 0203   K 3.6 10/30/2016 2232   K 3.9 08/22/2014 1256   CL 104 11/01/2016 0203   CL 100 (L) 10/30/2016 2232   CL 95 (L) 08/22/2014 1256   CO2 24 11/01/2016 0203   CO2 30 10/30/2016 2232   CO2 25 08/22/2014 1256   GLUCOSE 88 11/01/2016 0203   GLUCOSE 134 (H) 10/30/2016 2232   GLUCOSE 84 07/18/2015 1155   GLUCOSE 96 12/06/2014 1101   GLUCOSE 86 09/09/2014 0903   BUN 9 11/01/2016 0203   BUN 9 10/30/2016 2232  BUN 10 08/22/2014 1256   CREATININE 1.06 11/01/2016 0203   CREATININE 1.06 10/30/2016 2232   CREATININE 1.15 08/22/2014 1256   CALCIUM 8.5 (L) 11/01/2016 0203   CALCIUM 10.0 10/30/2016 2232   CALCIUM 9.6 08/22/2014 1256   GFRNONAA >60 11/01/2016 0203   GFRNONAA >60 10/30/2016 2232   GFRNONAA 77 (L) 08/22/2014 1256   GFRAA >60 11/01/2016 0203   GFRAA >60 10/30/2016 2232   GFRAA 89 (L) 08/22/2014 1256   CMP     Component Value Date/Time   NA 137 11/01/2016 0203   K 3.5 11/01/2016 0203   CL 104 11/01/2016 0203   CO2 24 11/01/2016 0203   GLUCOSE 88 11/01/2016 0203   BUN 9 11/01/2016 0203   CREATININE 1.06 11/01/2016 0203   CALCIUM 8.5 (L) 11/01/2016 0203   PROT 6.3 (L) 11/01/2016 0203   PROT 7.2 02/02/2016 0957   ALBUMIN 3.5 11/01/2016 0203   ALBUMIN 4.3 02/02/2016 0957   AST 29 11/01/2016 0203   ALT 40 11/01/2016 0203   ALKPHOS 87 11/01/2016 0203    BILITOT 0.3 11/01/2016 0203   BILITOT 0.3 02/02/2016 0957   GFRNONAA >60 11/01/2016 0203   GFRAA >60 11/01/2016 0203       Component Value Date/Time   WBC 9.5 10/30/2016 2232   WBC 10.2 12/06/2014 1101   WBC 13.5 (H) 08/22/2014 1256   WBC 13.2 (H) 03/09/2011 1120   HGB 15.5 10/30/2016 2232   HGB 16.6 12/06/2014 1101   HGB 15.9 08/22/2014 1256   HCT 44.2 10/30/2016 2232   HCT 48.4 12/06/2014 1101   HCT 46.3 08/22/2014 1256   MCV 89.7 10/30/2016 2232   MCV 90 12/06/2014 1101   MCV 90.6 08/22/2014 1256    Lipid Panel     Component Value Date/Time   CHOL 217 (H) 02/02/2016 0957   TRIG 307 (H) 02/02/2016 0957   HDL 23 (L) 02/02/2016 0957   CHOLHDL 9.4 (H) 02/02/2016 0957   CHOLHDL 10.2 09/09/2014 0903   VLDL 68 (H) 09/09/2014 0903   LDLCALC 133 (H) 02/02/2016 0957    ABG No results found for: PHART, PCO2ART, PO2ART, HCO3, TCO2, ACIDBASEDEF, O2SAT   No results found for: TSH BNP (last 3 results) No results for input(s): BNP in the last 8760 hours.  ProBNP (last 3 results) No results for input(s): PROBNP in the last 8760 hours.  Cardiac Panel (last 3 results) No results for input(s): CKTOTAL, CKMB, TROPONINI, RELINDX in the last 72 hours.  Iron/TIBC/Ferritin/ %Sat No results found for: IRON, TIBC, FERRITIN, IRONPCTSAT   EKG Orders placed or performed during the hospital encounter of 10/31/16  . EKG 12-Lead  . EKG 12-Lead  . ED EKG within 10 minutes  . ED EKG within 10 minutes  . EKG 12-Lead (at 6am)  . EKG 12-Lead immediately post procedure  . EKG 12-Lead  . EKG 12-Lead  . EKG 12-Lead  . EKG 12-Lead immediately post procedure  . EKG 12-Lead (at 6am)  . EKG 12-Lead  . EKG     Prior Assessment and Plan Problem List as of 11/09/2016 Reviewed: 07/24/2016  9:01 AM by Lilyan PuntScott Luking, MD     Cardiovascular and Mediastinum   NSTEMI (non-ST elevated myocardial infarction) [facility](HCC)     Other   Hyperlipidemia   Chest pain   Generalized anxiety disorder   Depression  with anxiety   Tobacco use   Status post coronary artery stent placement   Tobacco abuse       Imaging: Dg  Chest 2 View  Result Date: 10/30/2016 CLINICAL DATA:  Left-sided chest pain and hypertension. EXAM: CHEST  2 VIEW COMPARISON:  03/09/2011 FINDINGS: Normal heart size and pulmonary vascularity. No focal airspace disease or consolidation in the lungs. No blunting of costophrenic angles. No pneumothorax. Mediastinal contours appear intact. Linear scarring in the left mid lung. No change since prior study. IMPRESSION: No active cardiopulmonary disease. Electronically Signed   By: Burman Nieves M.D.   On: 10/30/2016 22:49

## 2016-11-09 NOTE — Patient Instructions (Signed)
Medication Instructions:  Your physician recommends that you continue on your current medications as directed. Please refer to the Current Medication list given to you today.   Labwork: NONE  Testing/Procedures: NONE  Follow-Up: Your physician recommends that you schedule a follow-up appointment in: 3 MONTHS    Any Other Special Instructions Will Be Listed Below (If Applicable).     If you need a refill on your cardiac medications before your next appointment, please call your pharmacy.   

## 2016-12-04 ENCOUNTER — Ambulatory Visit (INDEPENDENT_AMBULATORY_CARE_PROVIDER_SITE_OTHER): Payer: Self-pay | Admitting: Family Medicine

## 2016-12-04 ENCOUNTER — Encounter: Payer: Self-pay | Admitting: Family Medicine

## 2016-12-04 VITALS — BP 124/88 | Ht 73.0 in | Wt 218.0 lb

## 2016-12-04 DIAGNOSIS — F411 Generalized anxiety disorder: Secondary | ICD-10-CM

## 2016-12-04 DIAGNOSIS — I2510 Atherosclerotic heart disease of native coronary artery without angina pectoris: Secondary | ICD-10-CM

## 2016-12-04 DIAGNOSIS — E784 Other hyperlipidemia: Secondary | ICD-10-CM

## 2016-12-04 DIAGNOSIS — E7849 Other hyperlipidemia: Secondary | ICD-10-CM

## 2016-12-04 MED ORDER — SERTRALINE HCL 100 MG PO TABS: 100.0000 mg | ORAL_TABLET | Freq: Every day | ORAL | 6 refills | Status: DC

## 2016-12-04 MED ORDER — ALPRAZOLAM 0.5 MG PO TABS: ORAL_TABLET | ORAL | 5 refills | Status: DC

## 2016-12-04 NOTE — Progress Notes (Signed)
   Subjective:    Patient ID: Bruce King, male    DOB: 07/26/1971, 46 y.o.   MRN: [id number]  Hyperlipidemia  This is a chronic problem. The current episode started more than 1 year ago. Pertinent negatives include no chest pain or shortness of breath.   Patient states no other concerns this visit.  This patient had near total blockage of the LAD. Catheterization reviewed. Time spent with patient reviewing over his hospital records plus also findings from his catheterization and stents. Patient denies any chest tightness pressure pain he does relate a little bit of smoking doing a couple cigarettes a day he is been warned not to go back down that path and the stay away. He needs to quit smoking altogether He is tolerating his cholesterol medicine He states his anxiety medicine doing a good job of keeping things under control taking 1 tablet up to 4 times per day he has chronic anxiety he also takes Zoloft on a regular basis moods are doing well  Review of Systems  Constitutional: Negative for activity change, fatigue and fever.  Respiratory: Negative for cough and shortness of breath.   Cardiovascular: Negative for chest pain and leg swelling.  Neurological: Negative for headaches.       Objective:   Physical Exam  Constitutional: He appears well-nourished. No distress.  Cardiovascular: Normal rate, regular rhythm and normal heart sounds.   No murmur heard. Pulmonary/Chest: Effort normal and breath sounds normal. No respiratory distress.  Musculoskeletal: He exhibits no edema.  Lymphadenopathy:    He has no cervical adenopathy.  Neurological: He is alert.  Psychiatric: His behavior is normal.  Vitals reviewed.         Assessment & Plan:  HTN Blood pressure under very good control continue current measures watch diet closely stay physically active Hyperlipidemia previous lab showed hyperlipidemia he is now tolerating the medication very important for him to recheck lab  work in early May before his cardiology visit Quit smoking patient is encouraged to totally quit smoking he is smoking 2-3 cigarettes per day CAD heart disease is stable. He is doing 15 minutes of mild activity several times per week according to cardiology. The patient states he can gradually increase this up over the course of the next couple months to 30 minutes 5 times a week I have advised the patient hold off on any weight lifting for at least 2 months GAD continue Xanax he is taking 2 per day sometimes up to 4 per day continue Zoloft. Recheck 6 months

## 2017-01-22 ENCOUNTER — Encounter: Payer: Self-pay | Admitting: Interventional Cardiology

## 2017-02-06 NOTE — Progress Notes (Signed)
Cardiology Office Note   Date:  02/07/2017   ID:  Bruce King, DOB 08-12-1971, MRN [id number]  PCP:  Babs Sciara, MD    No chief complaint on file. f/u CAD   Wt Readings from Last 3 Encounters:  02/07/17 210 lb 12.8 oz (95.6 kg)  12/04/16 218 lb (98.9 kg)  11/09/16 215 lb (97.5 kg)       History of Present Illness: Bruce King is a 46 y.o. male  Who has CAD with 3 stents (Resolute Onyx) to the LAD placed in 2/18, for NSTEMI.  He has felt better since having the stents placed.  He has more energy.  He has some SHOB while doing some yardwork.  He can have some lightheadedness at times after exertion, but BP will be controlled. Most recent episode, BP was 116 systolic.   He has changed his diet and lost weight over the fast months.    He has a family h/o alcoholism.  He drinks one day a week and it can be heavy.  No NTG use.  Most strenuous activity is walking and exercise.  Angina at time of MI was feeling like "lava was being poured down my throat."    Past Medical History:  Diagnosis Date  . Anxiety   . Coronary artery disease   . Depression   . GAD (generalized anxiety disorder)   . GERD (gastroesophageal reflux disease)   . History of hiatal hernia   . Hyperlipidemia   . NSTEMI (non-ST elevated myocardial infarction) (HCC) 10/30/2016    Past Surgical History:  Procedure Laterality Date  . CORONARY ANGIOPLASTY WITH STENT PLACEMENT  10/31/2016   "3 stents"  . CORONARY STENT INTERVENTION N/A 10/31/2016   Procedure: Coronary Stent Intervention;  Surgeon: Iran Ouch, MD;  Location: [facility];  Service: Cardiovascular;  Laterality: N/A;  . FRACTURE SURGERY    . LEFT HEART CATH AND CORONARY ANGIOGRAPHY N/A 10/31/2016   Procedure: Left Heart Cath and Coronary Angiography;  Surgeon: Iran Ouch, MD;  Location: [facility]VASIVE CV LAB;  Service: Cardiovascular;  Laterality: N/A;  . ORIF TIBIA & FIBULA FRACTURES Right ~ 05/1996   "titanium rod"     Current Outpatient Prescriptions  Medication Sig Dispense Refill  . ALPRAZolam (XANAX) 0.5 MG tablet TAKE  (1)  TABLET  FOUR TIMES DAILY AS NEEDED FOR ANXIETY 120 tablet 5  . atorvastatin (LIPITOR) 20 MG tablet Take 1 tablet (20 mg total) by mouth daily at 6 PM. 30 tablet 3  . ezetimibe (ZETIA) 10 MG tablet Take 1 tablet (10 mg total) by mouth daily. 30 tablet 3  . losartan (COZAAR) 50 MG tablet Take 25 mg by mouth daily.    . metoprolol (LOPRESSOR) 50 MG tablet Take 1 tablet (50 mg total) by mouth 2 (two) times daily. 60 tablet 3  . nitroGLYCERIN (NITROSTAT) 0.4 MG SL tablet Place 1 tablet (0.4 mg total) under the tongue every 5 (five) minutes as needed for chest pain. 30 tablet 0  . sertraline (ZOLOFT) 100 MG tablet Take 1 tablet (100 mg total) by mouth daily. 30 tablet 6  . ticagrelor (BRILINTA) 90 MG TABS tablet Take 1 tablet (90 mg total) by mouth 2 (two) times daily. 60 tablet 3   No current facility-administered medications for this visit.     Allergies:   Bactrim [sulfamethoxazole-trimethoprim]; Lipitor [atorvastatin]; and Pravastatin    Social History:  The patient  reports that he has been smoking Cigarettes.  He started smoking about 27 years ago. He has a 25.00 pack-year smoking history. His smokeless tobacco use includes Chew and Snuff. He reports that he drinks about 8.4 oz of alcohol per week . He reports that he does not use drugs.   Family History:  The patient's family history includes Heart attack in his father; Hypertension in his mother.    ROS:  Please see the history of present illness.   Otherwise, review of systems are positive for weight loss- changed his diet after the MI.   All other systems are reviewed and negative.    PHYSICAL EXAM: VS:  BP 118/80   Pulse 65   Ht 6\' 1"  (1.854 m)   Wt 210 lb 12.8 oz (95.6 kg)   SpO2 98%   BMI 27.81 kg/m  , BMI Body mass index is 27.81 kg/m. GEN: Well nourished, well developed, in no acute distress   HEENT: normal  Neck: no JVD, carotid bruits, or masses Cardiac: RRR; no murmurs, rubs, or gallops,no edema ; 2+ PT pulses Respiratory:  clear to auscultation bilaterally, normal work of breathing GI: soft, nontender, nondistended, + BS MS: no deformity or atrophy  Skin: warm and dry, no rash Neuro:  Strength and sensation are intact Psych: euthymic mood, full affect   EKG:   The ekg ordered in 2/18 demonstrates NSR, anterior T wave inversions   Recent Labs: 10/30/2016: Hemoglobin 15.5 11/01/2016: ALT 40; BUN 9; Creatinine, Ser 1.06; Platelets 313; Potassium 3.5; Sodium 137   Lipid Panel    Component Value Date/Time   CHOL 217 (H) 02/02/2016 0957   TRIG 307 (H) 02/02/2016 0957   HDL 23 (L) 02/02/2016 0957   CHOLHDL 9.4 (H) 02/02/2016 0957   CHOLHDL 10.2 09/09/2014 0903   VLDL 68 (H) 09/09/2014 0903   LDLCALC 133 (H) 02/02/2016 0957     Other studies Reviewed: Additional studies/ records that were reviewed today with results demonstrating: cath records reviewed.  ECHO in 2/18 showed normal EF 55-60%.   ASSESSMENT AND PLAN:  1. CAD/Old MI: No angina on medical therapy.  Increasing exercise.  Avoid straining when he lifts weight.  He can do multiple reps of lower weights that he can breath through. 2. Tobacco abuse: He quit smoking afte rthe stents and then started back.  He has cut back form before.  He has an Rx for Zyban but was told to stop taking it.  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  Checked with PharmD.  OK to take Zyban to try to stop smoking.  He will resume the med he has at home. 3. HTN: Controlled.  COntinue losartan. 4. Hyperlipidemia: Need to recheck lipids and liver.   5. He drinks significantly on Friday, but none during the week.     Current medicines are reviewed at length with the patient today.  The patient concerns regarding his medicines were addressed.  The following changes have been made:  No change  Labs/ tests ordered  today include:  No orders of the defined types were placed in this encounter.   Recommend 150 minutes/week of aerobic exercise Low fat, low carb, high fiber diet recommended  Disposition:   FU in 6 months   Signed, Lance MussJayadeep Allisha Harter, MD  02/07/2017 2:08 PM    [facility] [address], [place], [place]  [address] Phone: [phone]; Fax: [phone]

## 2017-02-07 ENCOUNTER — Encounter: Payer: Self-pay | Admitting: Interventional Cardiology

## 2017-02-07 ENCOUNTER — Ambulatory Visit (INDEPENDENT_AMBULATORY_CARE_PROVIDER_SITE_OTHER): Payer: Self-pay | Admitting: Interventional Cardiology

## 2017-02-07 ENCOUNTER — Telehealth: Payer: Self-pay

## 2017-02-07 VITALS — BP 118/80 | HR 65 | Ht 73.0 in | Wt 210.8 lb

## 2017-02-07 DIAGNOSIS — Z955 Presence of coronary angioplasty implant and graft: Secondary | ICD-10-CM

## 2017-02-07 DIAGNOSIS — E782 Mixed hyperlipidemia: Secondary | ICD-10-CM

## 2017-02-07 DIAGNOSIS — Z720 Tobacco use: Secondary | ICD-10-CM

## 2017-02-07 DIAGNOSIS — I10 Essential (primary) hypertension: Secondary | ICD-10-CM | POA: Insufficient documentation

## 2017-02-07 DIAGNOSIS — I25118 Atherosclerotic heart disease of native coronary artery with other forms of angina pectoris: Secondary | ICD-10-CM

## 2017-02-07 DIAGNOSIS — I2510 Atherosclerotic heart disease of native coronary artery without angina pectoris: Secondary | ICD-10-CM | POA: Insufficient documentation

## 2017-02-07 DIAGNOSIS — I252 Old myocardial infarction: Secondary | ICD-10-CM

## 2017-02-07 MED ORDER — BUPROPION HCL ER (SMOKING DET) 150 MG PO TB12: 150.0000 mg | ORAL_TABLET | Freq: Two times a day (BID) | ORAL | 3 refills | Status: DC

## 2017-02-07 NOTE — Patient Instructions (Addendum)
Medication Instructions:  Your physician recommends that you continue on your current medications as directed. Please refer to the Current Medication list given to you today.  It is okay restart Zyban. Take 150 mg once a day for the first three days, then take 150 mg twice a day.  Labwork: None ordered.  Testing/Procedures: None ordered.  Follow-Up: Your physician wants you to follow-up in: 6 months with Dr. Eldridge DaceVaranasi.  You will receive a reminder letter in the mail two months in advance. If you don't receive a letter, please call our office to schedule the follow-up appointment.   Any Other Special Instructions Will Be Listed Below (If Applicable).     If you need a refill on your cardiac medications before your next appointment, please call your pharmacy.

## 2017-02-07 NOTE — Telephone Encounter (Signed)
Left message for patient to call back  

## 2017-02-08 NOTE — Telephone Encounter (Signed)
Patient informed that the financial assistance application that he submitted to the office yesterday was incomplete and that he needed further information to make his application complete. Patient requested that the form be mailed back to him. Correct address verified. Form mailed back to patient. Patient verbalized understanding and appreciated the call.

## 2017-03-30 ENCOUNTER — Other Ambulatory Visit: Payer: Self-pay | Admitting: Adult Health

## 2017-04-09 ENCOUNTER — Telehealth: Payer: Self-pay | Admitting: Interventional Cardiology

## 2017-04-09 NOTE — Telephone Encounter (Signed)
Spoke with Tiffany at Dr. Henriette CombsJohnson's office. Made her aware that the patient could have his teeth cleaned as long as they were not requiring him to hold any medicines.

## 2017-04-09 NOTE — Telephone Encounter (Signed)
New message       1. What dental office are you calling from?  Dr Laural BenesJohnson in [place]  2. What is your office phone and fax number?  Fax [phone]  What type of procedure is the patient having performed? cleaning 3. What date is procedure scheduled? 04-10-17  What is your question (ex. Antibiotics prior to procedure, holding medication-we need to know how long dentist wants pt to hold med)? Pt is coming in for a cleaning tomorrow and states that he had a heart attack and 3 stents placed in feb.  Can he have cleaning?

## 2017-04-09 NOTE — Telephone Encounter (Signed)
Attempted to call Tiffany at Dr. Henriette CombsJohnson's office back. There was no answer and was unable to leave VM.

## 2017-04-17 ENCOUNTER — Encounter: Payer: Self-pay | Admitting: Family Medicine

## 2017-04-17 ENCOUNTER — Ambulatory Visit (INDEPENDENT_AMBULATORY_CARE_PROVIDER_SITE_OTHER): Payer: Self-pay | Admitting: Family Medicine

## 2017-04-17 VITALS — BP 138/86 | Ht 73.0 in | Wt 218.0 lb

## 2017-04-17 DIAGNOSIS — M5412 Radiculopathy, cervical region: Secondary | ICD-10-CM

## 2017-04-17 MED ORDER — HYDROCODONE-ACETAMINOPHEN 10-325 MG PO TABS: 1.0000 | ORAL_TABLET | Freq: Four times a day (QID) | ORAL | 0 refills | Status: DC | PRN

## 2017-04-17 MED ORDER — PREDNISONE 20 MG PO TABS: ORAL_TABLET | ORAL | 0 refills | Status: DC

## 2017-04-17 NOTE — Progress Notes (Signed)
   Subjective:    Patient ID: Norton PastelFranklin D Meath, male    DOB: 11/04/1970, 46 y.o.   MRN: [id number]  Neck Pain   This is a recurrent problem. Episode onset:  5 days ago. The pain is present in the right side. He has tried acetaminophen (icy hot) for the symptoms. The treatment provided no relief.   Pain and discomfort in the neck radiates into the shoulder upper arm does not go down into the hand no shortness of breath no chest pressure tightness has history of heart disease also history of cervical impingement denies any injury   Review of Systems  Musculoskeletal: Positive for neck pain.  Negative for chest pressure tightness pain vomiting diarrhea fever chills     Objective:   Physical Exam Lungs clear heart regular pulse normal neck subjective discomfort right lateral side radiates to the deltoid region strength good reflexes good       Assessment & Plan:  Cervical impingement Prednisone taper Pain medication when necessary Follow-up if progressive troubles If not getting better over the next 3 months may need further intervention possibly MRI possibly referral  Follow-up on standard health issues this fall with lab work before visit

## 2017-05-09 ENCOUNTER — Telehealth: Payer: Self-pay | Admitting: Family Medicine

## 2017-05-09 NOTE — Telephone Encounter (Signed)
Pt called stating that the first round of prednisone did not take the pain in his neck away. Pt wants to know if Dr. Lorin PicketScott thinks that he should do another round or what. Please advise.

## 2017-05-10 ENCOUNTER — Other Ambulatory Visit: Payer: Self-pay | Admitting: *Deleted

## 2017-05-10 MED ORDER — PREDNISONE 20 MG PO TABS: ORAL_TABLET | ORAL | 0 refills | Status: DC

## 2017-05-10 NOTE — Telephone Encounter (Signed)
#  1 first step would be repeat prednisone No. 2 if that does not do enough next step would be adding gabapentin to see if that will help #3 if symptoms are not going away within the next 6-8 weeks he needs to follow-up and we will set him up for a MRI

## 2017-05-10 NOTE — Telephone Encounter (Signed)
Discussed with pt. Pt verbalized understanding. Med sent to pharm.  

## 2017-05-10 NOTE — Telephone Encounter (Addendum)
Level of pain is a 6 and uses Tylenol at night- prednisone helped some while he was taking it

## 2017-05-10 NOTE — Telephone Encounter (Signed)
Left message to return call 

## 2017-05-10 NOTE — Telephone Encounter (Signed)
Nurse's-please talk with the patient find out his level of pain visit pain radiated into the arm did the prednisone make any difference at all, see what the patient is currently doing for his pain, any weakness?

## 2017-06-21 ENCOUNTER — Other Ambulatory Visit: Payer: Self-pay | Admitting: Family Medicine

## 2017-06-21 NOTE — Telephone Encounter (Signed)
May have this +2 refills 

## 2017-07-23 ENCOUNTER — Other Ambulatory Visit: Payer: Self-pay | Admitting: Family Medicine

## 2017-07-23 ENCOUNTER — Other Ambulatory Visit: Payer: Self-pay | Admitting: Adult Health

## 2017-07-23 NOTE — Telephone Encounter (Signed)
May have this +1 refill he needs a follow-up office visit this fall

## 2017-08-05 NOTE — Progress Notes (Signed)
Cardiology Office Note   Date:  08/06/2017   ID:  Bruce King, DOB 11/30/1970, MRN [id number]  PCP:  Babs SciaraLuking, Scott A, MD    No chief complaint on file.  CAD  Wt Readings from Last 3 Encounters:  08/06/17 223 lb 6.4 oz (101.3 kg)  04/17/17 218 lb (98.9 kg)  02/07/17 210 lb 12.8 oz (95.6 kg)       History of Present Illness: Bruce King is a 46 y.o. male  Who has CAD with 3 stents (Resolute Onyx) to the LAD placed in 2/18, for NSTEMI. Angina at time of MI was feeling like "lava was being poured down my throat." He has a family h/o alcoholism.   Denies : Chest pain. Dizziness. Leg edema. Nitroglycerin use. Orthopnea. Palpitations. Paroxysmal nocturnal dyspnea. Syncope.   He continues to be active on his job.  He has not had any sx like his NSTEMI.    He is surprised that he does not feel cold even with his blood thinner.    He still drinks heavily once a week.  Past Medical History:  Diagnosis Date  . Anxiety   . Coronary artery disease   . Depression   . GAD (generalized anxiety disorder)   . GERD (gastroesophageal reflux disease)   . History of hiatal hernia   . Hyperlipidemia   . NSTEMI (non-ST elevated myocardial infarction) (HCC) 10/30/2016    Past Surgical History:  Procedure Laterality Date  . CORONARY ANGIOPLASTY WITH STENT PLACEMENT  10/31/2016   "3 stents"  . FRACTURE SURGERY    . ORIF TIBIA & FIBULA FRACTURES Right ~ 05/1996   "titanium rod"     Current Outpatient Medications  Medication Sig Dispense Refill  . ALPRAZolam (XANAX) 0.5 MG tablet TAKE 1 TABLET FOUR TIMES DAILY AS NEEDED FOR ANXIETY 120 tablet 2  . atorvastatin (LIPITOR) 20 MG tablet Take 1 tablet (20 mg total) by mouth daily at 6 PM. 30 tablet 9  . buPROPion (ZYBAN) 150 MG 12 hr tablet Take 1 tablet (150 mg total) by mouth 2 (two) times daily. 180 tablet 3  . ezetimibe (ZETIA) 10 MG tablet Take 1 tablet (10 mg total) by mouth daily. 30 tablet 3  .  HYDROcodone-acetaminophen (NORCO) 10-325 MG tablet Take 1 tablet by mouth every 6 (six) hours as needed. 28 tablet 0  . losartan (COZAAR) 50 MG tablet Take 1 tablet (50 mg total) by mouth daily. 30 tablet 0  . metoprolol tartrate (LOPRESSOR) 50 MG tablet Take 1 tablet (50 mg total) by mouth 2 (two) times daily. 60 tablet 9  . nitroGLYCERIN (NITROSTAT) 0.4 MG SL tablet Place 1 tablet (0.4 mg total) under the tongue every 5 (five) minutes as needed for chest pain. 30 tablet 0  . predniSONE (DELTASONE) 20 MG tablet 3qd for 3d then 2qd for 3d then 1qd for 3d 18 tablet 0  . sertraline (ZOLOFT) 100 MG tablet TAKE 1 TABLET DAILY 30 tablet 1  . ticagrelor (BRILINTA) 90 MG TABS tablet Take 1 tablet (90 mg total) by mouth 2 (two) times daily. 60 tablet 3   No current facility-administered medications for this visit.     Allergies:   Bactrim [sulfamethoxazole-trimethoprim]; Lipitor [atorvastatin]; and Pravastatin    Social History:  The patient  reports that he has been smoking cigarettes.  He started smoking about 27 years ago. He has a 25.00 pack-year smoking history. His smokeless tobacco use includes chew and snuff. He reports that he drinks about  8.4 oz of alcohol per week. He reports that he does not use drugs.   Family History:  The patient's family history includes Heart attack in his father; Hypertension in his mother.    ROS:  Please see the history of present illness.   Otherwise, review of systems are positive for rare headache.   All other systems are reviewed and negative.    PHYSICAL EXAM: VS:  BP 122/86   Pulse 63   Ht 6\' 1"  (1.854 m)   Wt 223 lb 6.4 oz (101.3 kg)   SpO2 99%   BMI 29.47 kg/m  , BMI Body mass index is 29.47 kg/m. GEN: Well nourished, well developed, in no acute distress  HEENT: normal  Neck: no JVD, carotid bruits, or masses Cardiac: RRR; no murmurs, rubs, or gallops,no edema  Respiratory:  clear to auscultation bilaterally, normal work of breathing GI: soft,  nontender, nondistended, + BS MS: no deformity or atrophy  Skin: warm and dry, no rash Neuro:  Strength and sensation are intact Psych: euthymic mood, full affect    Recent Labs: 10/30/2016: Hemoglobin 15.5 11/01/2016: ALT 40; BUN 9; Creatinine, Ser 1.06; Platelets 313; Potassium 3.5; Sodium 137   Lipid Panel    Component Value Date/Time   CHOL 217 (H) 02/02/2016 0957   TRIG 307 (H) 02/02/2016 0957   HDL 23 (L) 02/02/2016 0957   CHOLHDL 9.4 (H) 02/02/2016 0957   CHOLHDL 10.2 09/09/2014 0903   VLDL 68 (H) 09/09/2014 0903   LDLCALC 133 (H) 02/02/2016 0957     Other studies Reviewed: Additional studies/ records that were reviewed today with results demonstrating: 3 stents in the LAD.   ASSESSMENT AND PLAN:  1. CAD/Old MI: s/p PCI. No angina on medical therapy.  Residual diagonal disease.  Switch to Plavix at next visit. 2. Tobacco abuse: He continues to smoke.  He tried cold Mal[place]urkey and had success for a month.  Zyban was prescribed, but he has not used it. 3. HTN: BP controlled. Continue current meds. 4. Hyperlipidemia: Check lipids today.  Continue lipid lowering therapy.  Continue lipitor and Zetia.   Current medicines are reviewed at length with the patient today.  The patient concerns regarding his medicines were addressed.  The following changes have been made:  No change  Labs/ tests ordered today include:  No orders of the defined types were placed in this encounter.   Recommend 150 minutes/week of aerobic exercise Low fat, low carb, high fiber diet recommended  Disposition:   FU in 6 months   Signed, Lance MussJayadeep Varanasi, MD  08/06/2017 11:23 AM    [facility] [address], [place], [place]  [address] Phone: [phone]; Fax: [phone]

## 2017-08-06 ENCOUNTER — Encounter: Payer: Self-pay | Admitting: Interventional Cardiology

## 2017-08-06 ENCOUNTER — Ambulatory Visit (INDEPENDENT_AMBULATORY_CARE_PROVIDER_SITE_OTHER): Payer: Self-pay | Admitting: Interventional Cardiology

## 2017-08-06 VITALS — BP 122/86 | HR 63 | Ht 73.0 in | Wt 223.4 lb

## 2017-08-06 DIAGNOSIS — I25118 Atherosclerotic heart disease of native coronary artery with other forms of angina pectoris: Secondary | ICD-10-CM

## 2017-08-06 DIAGNOSIS — Z955 Presence of coronary angioplasty implant and graft: Secondary | ICD-10-CM

## 2017-08-06 DIAGNOSIS — E782 Mixed hyperlipidemia: Secondary | ICD-10-CM

## 2017-08-06 DIAGNOSIS — Z720 Tobacco use: Secondary | ICD-10-CM

## 2017-08-06 DIAGNOSIS — I10 Essential (primary) hypertension: Secondary | ICD-10-CM

## 2017-08-06 DIAGNOSIS — I252 Old myocardial infarction: Secondary | ICD-10-CM

## 2017-08-06 LAB — COMPREHENSIVE METABOLIC PANEL
A/G RATIO: 1.9 (ref 1.2–2.2)
ALT: 39 IU/L (ref 0–44)
AST: 29 IU/L (ref 0–40)
Albumin: 4.9 g/dL (ref 3.5–5.5)
Alkaline Phosphatase: 122 IU/L — ABNORMAL HIGH (ref 39–117)
BUN/Creatinine Ratio: 11 (ref 9–20)
BUN: 12 mg/dL (ref 6–24)
Bilirubin Total: 0.3 mg/dL (ref 0.0–1.2)
CALCIUM: 9.6 mg/dL (ref 8.7–10.2)
CO2: 22 mmol/L (ref 20–29)
CREATININE: 1.12 mg/dL (ref 0.76–1.27)
Chloride: 99 mmol/L (ref 96–106)
GFR, EST AFRICAN AMERICAN: 91 mL/min/{1.73_m2} (ref >59)
GFR, EST NON AFRICAN AMERICAN: 79 mL/min/{1.73_m2} (ref >59)
GLOBULIN, TOTAL: 2.6 g/dL (ref 1.5–4.5)
GLUCOSE: 81 mg/dL (ref 65–99)
POTASSIUM: 4.1 mmol/L (ref 3.5–5.2)
SODIUM: 139 mmol/L (ref 134–144)
TOTAL PROTEIN: 7.5 g/dL (ref 6.0–8.5)

## 2017-08-06 LAB — LIPID PANEL
CHOL/HDL RATIO: 4.7 ratio (ref 0.0–5.0)
Cholesterol, Total: 112 mg/dL (ref 100–199)
HDL: 24 mg/dL — AB (ref >39)
LDL CALC: 26 mg/dL (ref 0–99)
Triglycerides: 308 mg/dL — ABNORMAL HIGH (ref 0–149)
VLDL Cholesterol Cal: 62 mg/dL — ABNORMAL HIGH (ref 5–40)

## 2017-08-06 MED ORDER — METOPROLOL TARTRATE 50 MG PO TABS: 50.0000 mg | ORAL_TABLET | Freq: Two times a day (BID) | ORAL | 3 refills | Status: DC

## 2017-08-06 MED ORDER — NITROGLYCERIN 0.4 MG SL SUBL: 0.4000 mg | SUBLINGUAL_TABLET | SUBLINGUAL | 3 refills | Status: DC | PRN

## 2017-08-06 MED ORDER — ATORVASTATIN CALCIUM 20 MG PO TABS: 20.0000 mg | ORAL_TABLET | Freq: Every day | ORAL | 3 refills | Status: DC

## 2017-08-06 NOTE — Patient Instructions (Signed)
Medication Instructions:  Your physician recommends that you continue on your current medications as directed. Please refer to the Current Medication list given to you today.   Labwork: TODAY: CMET, LIPIDS  Testing/Procedures: None ordered  Follow-Up: Your physician wants you to follow-up in: 6 months with Dr. Varanasi. You will receive a reminder letter in the mail two months in advance. If you don't receive a letter, please call our office to schedule the follow-up appointment.   Any Other Special Instructions Will Be Listed Below (If Applicable).     If you need a refill on your cardiac medications before your next appointment, please call your pharmacy.   

## 2017-08-12 ENCOUNTER — Telehealth: Payer: Self-pay | Admitting: Interventional Cardiology

## 2017-08-12 MED ORDER — VASCEPA 1 G PO CAPS: 4.0000 g | ORAL_CAPSULE | Freq: Every day | ORAL | 11 refills | Status: DC

## 2017-08-12 MED ORDER — ICOSAPENT ETHYL 1 G PO CAPS: 4.0000 g | ORAL_CAPSULE | Freq: Every day | ORAL | 11 refills | Status: DC

## 2017-08-12 NOTE — Telephone Encounter (Signed)
Corky CraftsVaranasi, Jayadeep S, MD  Daleen Bourrie, Tanielle Emigh I, RN        OK to try this recommendation below.   JV   Previous Messages    ----- Message -----  From: Awilda MetroSupple, Megan E, [facility]  Sent: 08/07/2017 12:02 PM  To: Corky CraftsJayadeep S Varanasi, MD   I think that's a great idea with the new data out from the REDUCE-IT trial. The dose that showed benefit was 4g daily (or 2g BID, whatever pt prefers) of brand name Vascepa.   Thanks,  Aundra MilletMegan    ----- Message -----  From: Corky CraftsVaranasi, Jayadeep S, MD  Sent: 08/06/2017  5:12 PM  To: Megan E Supple, RPH   Would you add fish oil for this patient with ACS? Specifically brand name fish oil?

## 2017-08-12 NOTE — Telephone Encounter (Signed)
Called an made patient aware of lab results and recommendations to start Vascepa 4 g QD. Patient verbalized understanding. Rx sent to patient's preferred pharmacy. Patient states that he is going to have his lab rechecked at his PCP in February.

## 2017-08-12 NOTE — Addendum Note (Signed)
Addended by: Daleen BoURRIE, Isreal Moline I on: 08/12/2017 03:09 PM   Modules accepted: Orders

## 2017-08-12 NOTE — Telephone Encounter (Signed)
New message ° ° °Patient calling for lab results. Please call °

## 2017-08-12 NOTE — Telephone Encounter (Signed)
Received message from refills stating that the patient's pharmacy is calling and states that the patient cannot afford the Vascepa. Spoke with [place], [facility]. We re-ordered the Vascepa and entered the Vascepa Co-Pay Card information in the notes to the pharmacy so that the patient can get the medication at a discount. I called and [facility] to make them aware. He states that the patient no longer has insurance. He states that the patient said that he is in the process but does not have it at this time. Per Aundra MilletMegan, [facility], patient can wait to start Vascepa until when he gets his insurance. Left a detailed message on patient's VM (DPR on file) letting the patient know that we will hold off on starting the Vascepa until he finishes getting his insurance in place so that he can use the Co-Pay Card. Instructed for the patient to call back with any questions.

## 2017-09-23 ENCOUNTER — Other Ambulatory Visit: Payer: Self-pay | Admitting: Family Medicine

## 2017-09-23 NOTE — Telephone Encounter (Signed)
This +2 refills will need follow-up office visit 

## 2017-09-25 ENCOUNTER — Other Ambulatory Visit: Payer: Self-pay | Admitting: Adult Health

## 2017-12-26 ENCOUNTER — Other Ambulatory Visit: Payer: Self-pay | Admitting: Family Medicine

## 2017-12-26 NOTE — Telephone Encounter (Signed)
Patient may have 14-day supply needs office visit on each medicine

## 2018-01-15 ENCOUNTER — Telehealth: Payer: Self-pay | Admitting: Family Medicine

## 2018-01-15 MED ORDER — SERTRALINE HCL 100 MG PO TABS: 100.0000 mg | ORAL_TABLET | Freq: Every day | ORAL | 0 refills | Status: DC

## 2018-01-15 MED ORDER — ALPRAZOLAM 0.5 MG PO TABS
ORAL_TABLET | ORAL | 0 refills | Status: DC
Start: 2018-01-15 — End: 2018-01-22

## 2018-01-15 NOTE — Telephone Encounter (Signed)
Medications sent and pt is aware.

## 2018-01-15 NOTE — Telephone Encounter (Signed)
One ref of each

## 2018-01-15 NOTE — Telephone Encounter (Signed)
Patient is requesting a refill on his Zoloft 100 mg tab and alprazolam 0.5 mg tabs.  He said he is completely out of his Zoloft and will be out of his Alprazolam in the next day or two.  He has a follow up appointment on 01/22/18.  [facility]

## 2018-01-22 ENCOUNTER — Ambulatory Visit (INDEPENDENT_AMBULATORY_CARE_PROVIDER_SITE_OTHER): Payer: Self-pay | Admitting: Family Medicine

## 2018-01-22 ENCOUNTER — Encounter: Payer: Self-pay | Admitting: Family Medicine

## 2018-01-22 VITALS — BP 138/98 | Ht 73.0 in | Wt 228.0 lb

## 2018-01-22 DIAGNOSIS — I10 Essential (primary) hypertension: Secondary | ICD-10-CM

## 2018-01-22 DIAGNOSIS — N529 Male erectile dysfunction, unspecified: Secondary | ICD-10-CM | POA: Insufficient documentation

## 2018-01-22 DIAGNOSIS — F411 Generalized anxiety disorder: Secondary | ICD-10-CM

## 2018-01-22 MED ORDER — SILDENAFIL CITRATE 20 MG PO TABS: ORAL_TABLET | ORAL | 3 refills | Status: DC

## 2018-01-22 MED ORDER — ALPRAZOLAM 0.5 MG PO TABS: ORAL_TABLET | ORAL | 5 refills | Status: DC

## 2018-01-22 NOTE — Progress Notes (Signed)
   Subjective:    Patient ID: Bruce King, male    DOB: July 03, 1971, 47 y.o.   MRN: [id number]  HPI Patient is here today to follow up on Chronic health issues.He takes Lipitor 20 mg one daily for Hyperlipidemia. He is still taking xanax 0.5 mg four time daily prn, He takes Zoloft 100 mg one daily. He has filled out the phq 9 and the gad today for his depression and anxiety. This patient denies any depression states he has not had any panic attacks He is interested in either backing down or stopping his Zoloft if possible we discussed how to taper this off over 4 to 6 weeks if he has a return of anxiety or depression or has increased panic attacks he needs to restart the medicine he understands this he denies any chest tightness pressure pain shortness of breath he still smokes he knows he needs to quit he does see cardiology on a regular basis  Review of Systems  Constitutional: Negative for activity change, fatigue and fever.  HENT: Negative for congestion and rhinorrhea.   Respiratory: Negative for cough and shortness of breath.   Cardiovascular: Negative for chest pain and leg swelling.  Gastrointestinal: Negative for abdominal pain, diarrhea and nausea.  Genitourinary: Negative for dysuria and hematuria.  Neurological: Negative for weakness and headaches.  Psychiatric/Behavioral: Negative for behavioral problems.       Objective:   Physical Exam  Constitutional: He appears well-nourished. No distress.  Cardiovascular: Normal rate, regular rhythm and normal heart sounds.  No murmur heard. Pulmonary/Chest: Effort normal and breath sounds normal. No respiratory distress.  Musculoskeletal: He exhibits no edema.  Lymphadenopathy:    He has no cervical adenopathy.  Neurological: He is alert.  Psychiatric: His behavior is normal.  Vitals reviewed.   Patient with mild erectile dysfunction has difficult time getting and maintaining an erection this happens from time to time denies  any serious underlying issues this may get better if he pulls away from Zoloft      Assessment & Plan:  Patient does use tobacco products.  Patient knows they should quit.  Patient is aware that smoking/in use of tobacco products increases their risk of heart disease, cancer, and COPD- lung issues.  Patient has been counseled to quit smoking/tobacco products.  Generalized anxiety disorder he was given prescription for Xanax but needs follow-up in 6 months he takes a maximum of 4/day some days he only takes 2 or 3 he denies using alcohol with Xanax and denies being abusive of that or other medicines  He states he will consider tapering down on Zoloft he will let us know how that goes  Erectile dysfunction sildenafil prescribed patient was encouraged never to use this with nitroglycerin he was told why he is willing to try the medicine he feels he needs

## 2018-01-22 NOTE — Progress Notes (Signed)
Left message to return call with patient.   

## 2018-03-16 NOTE — Progress Notes (Signed)
Cardiology Office Note   Date:  03/17/2018   ID:  Bruce King, DOB 03/19/1971, MRN [id number]  PCP:  Babs SciaraLuking, Scott A, MD    No chief complaint on file.  CAD  Wt Readings from Last 3 Encounters:  03/17/18 232 lb 6.4 oz (105.4 kg)  01/22/18 228 lb (103.4 kg)  08/06/17 223 lb 6.4 oz (101.3 kg)       History of Present Illness: Bruce King is a 47 y.o. male  Who has CAD with 3 stents ([employer]) to the LAD placed in 2/18, for NSTEMI. Angina at time of MI was feeling like "lava was being poured down my throat." He has a family h/o alcoholism.   Since the last visit, he has had some "hot flash" episodes at rest.  THey last 5 minutes and are random.  No triggers.  THey happen 1x/week if that much.  Not similar to MI sx.  No problems with strenuous activity.  He works for the [employer].  He kayaks and has no problems.  He feels better with the activity.  Denies : Chest pain. Dizziness. Leg edema. Nitroglycerin use. Orthopnea. Palpitations. Paroxysmal nocturnal dyspnea. Shortness of breath. Syncope.   He continues to smoke.     Past Medical History:  Diagnosis Date  . Anxiety   . Coronary artery disease   . Depression   . GAD (generalized anxiety disorder)   . GERD (gastroesophageal reflux disease)   . History of hiatal hernia   . Hyperlipidemia   . NSTEMI (non-ST elevated myocardial infarction) (HCC) 10/30/2016    Past Surgical History:  Procedure Laterality Date  . CORONARY ANGIOPLASTY WITH STENT PLACEMENT  10/31/2016   "3 stents"  . CORONARY STENT INTERVENTION N/A 10/31/2016   Procedure: Coronary Stent Intervention;  Surgeon: Iran OuchMuhammad A Arida, MD;  Location: [facility];  Service: Cardiovascular;  Laterality: N/A;  . FRACTURE SURGERY    . LEFT HEART CATH AND CORONARY ANGIOGRAPHY N/A 10/31/2016   Procedure: Left Heart Cath and Coronary Angiography;  Surgeon: Iran OuchMuhammad A Arida, MD;  Location: [facility];  Service: Cardiovascular;   Laterality: N/A;  . ORIF TIBIA & FIBULA FRACTURES Right ~ 05/1996   "titanium rod"     Current Outpatient Medications  Medication Sig Dispense Refill  . ALPRAZolam (XANAX) 0.5 MG tablet TAKE 1 TABLET FOUR TIMES DAILY AS NEEDED FOR ANXIETY 120 tablet 5  . atorvastatin (LIPITOR) 20 MG tablet Take 1 tablet (20 mg total) daily at 6 PM by mouth. 90 tablet 3  . ezetimibe (ZETIA) 10 MG tablet Take 1 tablet (10 mg total) by mouth daily. 30 tablet 3  . losartan (COZAAR) 50 MG tablet Take 1 tablet (50 mg total) by mouth daily. 90 tablet 3  . metoprolol tartrate (LOPRESSOR) 50 MG tablet Take 1 tablet (50 mg total) 2 (two) times daily by mouth. 180 tablet 3  . nitroGLYCERIN (NITROSTAT) 0.4 MG SL tablet Place 1 tablet (0.4 mg total) every 5 (five) minutes as needed under the tongue for chest pain. 25 tablet 3  . sertraline (ZOLOFT) 100 MG tablet Take 1 tablet (100 mg total) by mouth daily. 90 tablet 0  . sildenafil (REVATIO) 20 MG tablet 3 to 5 before relations 50 tablet 3  . ticagrelor (BRILINTA) 90 MG TABS tablet Take 1 tablet (90 mg total) by mouth 2 (two) times daily. 60 tablet 3   No current facility-administered medications for this visit.     Allergies:  Bactrim [sulfamethoxazole-trimethoprim]; Lipitor [atorvastatin]; and Pravastatin    Social History:  The patient  reports that he has been smoking cigarettes.  He started smoking about 28 years ago. He has a 25.00 pack-year smoking history. He has quit using smokeless tobacco. His smokeless tobacco use included chew and snuff. He reports that he drinks about 8.4 oz of alcohol per week. He reports that he does not use drugs.   Family History:  The patient's family history includes Heart attack in his father; Hypertension in his mother.    ROS:  Please see the history of present illness.   Otherwise, review of systems are positive for hot flashes as above.   All other systems are reviewed and negative.    PHYSICAL EXAM: VS:  BP (!) 138/94    Pulse 64   Ht 6\' 1"  (1.854 m)   Wt 232 lb 6.4 oz (105.4 kg)   SpO2 95%   BMI 30.66 kg/m  , BMI Body mass index is 30.66 kg/m. GEN: Well nourished, well developed, in no acute distress  HEENT: normal  Neck: no JVD, carotid bruits, or masses Cardiac: RRR; no murmurs, rubs, or gallops,no edema  Respiratory:  clear to auscultation bilaterally, normal work of breathing GI: soft, nontender, nondistended, + BS MS: no deformity or atrophy  Skin: warm and dry, no rash Neuro:  Strength and sensation are intact Psych: euthymic mood, full affect   EKG:   The ekg ordered today demonstrates normal   Recent Labs: 08/06/2017: ALT 39; BUN 12; Creatinine, Ser 1.12; Potassium 4.1; Sodium 139   Lipid Panel    Component Value Date/Time   CHOL 112 08/06/2017 1146   TRIG 308 (H) 08/06/2017 1146   HDL 24 (L) 08/06/2017 1146   CHOLHDL 4.7 08/06/2017 1146   CHOLHDL 10.2 09/09/2014 0903   VLDL 68 (H) 09/09/2014 0903   LDLCALC 26 08/06/2017 1146     Other studies Reviewed: Additional studies/ records that were reviewed today with results demonstrating: normal LVEF 55-60%.   ASSESSMENT AND PLAN:  1. CAD/Old MI: No clear angina.  I don't think that the hot flashes are related to his heart.  He feels well while exercising.   2. Tobacco abuse: Tried Wellbutrin without success.  Considering CBD oil.   3. Hyperlipidemia: LDL well controlled.   4. HTN: Controlled.  BP has been in the 128/80s range typically.  A little high in the MDs office.  Continue to monitor.  May need to increase losartan to 50 mg dose daily.  It had been cut to 25 mg for low BP.   5. WEight gain: He eats a lot of bread and cheese.  Decrease soda intake.  Weight loss will help BP.    Current medicines are reviewed at length with the patient today.  The patient concerns regarding his medicines were addressed.  The following changes have been made:  No change  Labs/ tests ordered today include:  No orders of the defined types  were placed in this encounter.   Recommend 150 minutes/week of aerobic exercise Low fat, low carb, high fiber diet recommended  Disposition:   FU in 1 year   Signed, Lance Muss, MD  03/17/2018 9:45 AM    [facility] [address] [place], [place], [place]  [address] Phone: [phone]; Fax: [phone]

## 2018-03-17 ENCOUNTER — Ambulatory Visit (INDEPENDENT_AMBULATORY_CARE_PROVIDER_SITE_OTHER): Payer: Self-pay | Admitting: Interventional Cardiology

## 2018-03-17 ENCOUNTER — Encounter

## 2018-03-17 ENCOUNTER — Encounter: Payer: Self-pay | Admitting: Interventional Cardiology

## 2018-03-17 VITALS — BP 138/94 | HR 64 | Ht 73.0 in | Wt 232.4 lb

## 2018-03-17 DIAGNOSIS — I252 Old myocardial infarction: Secondary | ICD-10-CM

## 2018-03-17 DIAGNOSIS — I10 Essential (primary) hypertension: Secondary | ICD-10-CM

## 2018-03-17 DIAGNOSIS — I25118 Atherosclerotic heart disease of native coronary artery with other forms of angina pectoris: Secondary | ICD-10-CM

## 2018-03-17 DIAGNOSIS — Z720 Tobacco use: Secondary | ICD-10-CM

## 2018-03-17 DIAGNOSIS — E782 Mixed hyperlipidemia: Secondary | ICD-10-CM

## 2018-03-17 NOTE — Patient Instructions (Signed)

## 2018-04-21 ENCOUNTER — Other Ambulatory Visit: Payer: Self-pay | Admitting: Family Medicine

## 2018-04-23 ENCOUNTER — Other Ambulatory Visit: Payer: Self-pay | Admitting: Family Medicine

## 2018-07-23 ENCOUNTER — Other Ambulatory Visit: Payer: Self-pay | Admitting: Family Medicine

## 2018-07-23 ENCOUNTER — Telehealth: Payer: Self-pay | Admitting: Family Medicine

## 2018-07-23 NOTE — Telephone Encounter (Signed)
Just received a request for refills It is fine to give 90-day on his Zoloft but he needs an office visit Please do not give him 6 months as previously stated

## 2018-07-23 NOTE — Telephone Encounter (Signed)
Give 6 months of refill 

## 2018-07-24 NOTE — Telephone Encounter (Signed)
90 day supply sent in yesterday with 0 refills.

## 2018-08-08 ENCOUNTER — Other Ambulatory Visit: Payer: Self-pay | Admitting: Interventional Cardiology

## 2018-08-14 ENCOUNTER — Other Ambulatory Visit: Payer: Self-pay | Admitting: Family Medicine

## 2018-08-14 NOTE — Telephone Encounter (Signed)
Please notify patient he needs an office visit I am willing to do one additional prescription to cover until he comes him

## 2018-08-18 ENCOUNTER — Ambulatory Visit: Payer: Self-pay | Admitting: Family Medicine

## 2018-09-02 ENCOUNTER — Ambulatory Visit: Payer: Self-pay | Admitting: Family Medicine

## 2018-09-02 ENCOUNTER — Encounter: Payer: Self-pay | Admitting: Family Medicine

## 2018-09-02 VITALS — BP 144/102 | Ht 73.0 in | Wt 232.1 lb

## 2018-09-02 DIAGNOSIS — Z23 Encounter for immunization: Secondary | ICD-10-CM

## 2018-09-02 DIAGNOSIS — E782 Mixed hyperlipidemia: Secondary | ICD-10-CM

## 2018-09-02 DIAGNOSIS — Z79899 Other long term (current) drug therapy: Secondary | ICD-10-CM

## 2018-09-02 DIAGNOSIS — I10 Essential (primary) hypertension: Secondary | ICD-10-CM

## 2018-09-02 DIAGNOSIS — T148XXA Other injury of unspecified body region, initial encounter: Secondary | ICD-10-CM

## 2018-09-02 DIAGNOSIS — H6123 Impacted cerumen, bilateral: Secondary | ICD-10-CM

## 2018-09-02 DIAGNOSIS — F411 Generalized anxiety disorder: Secondary | ICD-10-CM

## 2018-09-02 MED ORDER — SILDENAFIL CITRATE 20 MG PO TABS: ORAL_TABLET | ORAL | 3 refills | Status: DC

## 2018-09-02 MED ORDER — ATORVASTATIN CALCIUM 20 MG PO TABS: 20.0000 mg | ORAL_TABLET | Freq: Every day | ORAL | 3 refills | Status: DC

## 2018-09-02 MED ORDER — ALPRAZOLAM 0.5 MG PO TABS: ORAL_TABLET | ORAL | 5 refills | Status: DC

## 2018-09-02 MED ORDER — SERTRALINE HCL 100 MG PO TABS: 100.0000 mg | ORAL_TABLET | Freq: Every day | ORAL | 1 refills | Status: DC

## 2018-09-02 MED ORDER — LOSARTAN POTASSIUM 50 MG PO TABS: 50.0000 mg | ORAL_TABLET | Freq: Every day | ORAL | 3 refills | Status: DC

## 2018-09-02 NOTE — Progress Notes (Signed)
   Subjective:    Patient ID: Norton PastelFranklin D Steinberg, male    DOB: 04/22/1971, 47 y.o.   MRN: [id number]  HPI Patient is here today to follow up on his chronic illnesses. He has a history of Htn.He is taking Losartan 50 mg one po Qd, He is also taking Metoprolol 50 mg bid.He is still taking Zoloft 100 mg daily,xanax 0.5 1-3 per day for anxiety and depression. He sees a Development worker, international aidCardiologist. He does exercise some and he does not eat the way he should,but he does not eat out a lot/fast foods. He does smoke he knows he needs to quit denies any chest tightness pressure pain shortness of breath has history of high blood pressure heart disease anxiety and depression states depression under good control currently would like to continue his medicines  Review of Systems  Constitutional: Negative for activity change, fatigue and fever.  HENT: Negative for congestion and rhinorrhea.   Respiratory: Negative for cough and shortness of breath.   Cardiovascular: Negative for chest pain and leg swelling.  Gastrointestinal: Negative for abdominal pain, diarrhea and nausea.  Genitourinary: Negative for dysuria and hematuria.  Neurological: Negative for weakness and headaches.  Psychiatric/Behavioral: Negative for agitation and behavioral problems.       Objective:   Physical Exam  Constitutional: He appears well-nourished. No distress.  HENT:  Head: Normocephalic and atraumatic.  Eyes: Right eye exhibits no discharge. Left eye exhibits no discharge.  Neck: No tracheal deviation present.  Cardiovascular: Normal rate, regular rhythm and normal heart sounds.  No murmur heard. Pulmonary/Chest: Effort normal and breath sounds normal. No respiratory distress.  Musculoskeletal: He exhibits no edema.  Lymphadenopathy:    He has no cervical adenopathy.  Neurological: He is alert. Coordination normal.  Skin: Skin is warm and dry.  Psychiatric: He has a normal mood and affect. His behavior is normal.  Vitals  reviewed.         Assessment & Plan:  Blood pressure good control continue current measures try to quit smoking  Hyperlipidemia continue both medications check lab work await results  Generalized anxiety disorder along with history of depression doing well on antidepressant uses Xanax 3-4 times a day caution drowsiness patient was talked to at length about this currently he would like to continue to medicine but he states somewhere down the road he would like to try to taper the Xanax  Bilateral cerumen impaction recommended referral to ENT patient would like to hold on this currently  Tetanus shot because of abrasions and small cuts on his hands  Flu shot recommended  Follow-up 6 months  Quit smoking and follow-up with cardiology in January

## 2018-09-02 NOTE — Patient Instructions (Signed)
Steps to Quit Smoking Smoking tobacco can be bad for your health. It can also affect almost every organ in your body. Smoking puts you and people around you at risk for many serious long-lasting (chronic) diseases. Quitting smoking is hard, but it is one of the best things that you can do for your health. It is never too late to quit. What are the benefits of quitting smoking? When you quit smoking, you lower your risk for getting serious diseases and conditions. They can include:  Lung cancer or lung disease.  Heart disease.  Stroke.  Heart attack.  Not being able to have children (infertility).  Weak bones (osteoporosis) and broken bones (fractures).  If you have coughing, wheezing, and shortness of breath, those symptoms may get better when you quit. You may also get sick less often. If you are pregnant, quitting smoking can help to lower your chances of having a baby of low birth weight. What can I do to help me quit smoking? Talk with your doctor about what can help you quit smoking. Some things you can do (strategies) include:  Quitting smoking totally, instead of slowly cutting back how much you smoke over a period of time.  Going to in-person counseling. You are more likely to quit if you go to many counseling sessions.  Using resources and support systems, such as: ? Online chats with a counselor. ? Phone quitlines. ? Printed self-help materials. ? Support groups or group counseling. ? Text messaging programs. ? Mobile phone apps or applications.  Taking medicines. Some of these medicines may have nicotine in them. If you are pregnant or breastfeeding, do not take any medicines to quit smoking unless your doctor says it is okay. Talk with your doctor about counseling or other things that can help you.  Talk with your doctor about using more than one strategy at the same time, such as taking medicines while you are also going to in-person counseling. This can help make  quitting easier. What things can I do to make it easier to quit? Quitting smoking might feel very hard at first, but there is a lot that you can do to make it easier. Take these steps:  Talk to your family and friends. Ask them to support and encourage you.  Call phone quitlines, reach out to support groups, or work with a counselor.  Ask people who smoke to not smoke around you.  Avoid places that make you want (trigger) to smoke, such as: ? Bars. ? Parties. ? Smoke-break areas at work.  Spend time with people who do not smoke.  Lower the stress in your life. Stress can make you want to smoke. Try these things to help your stress: ? Getting regular exercise. ? Deep-breathing exercises. ? Yoga. ? Meditating. ? Doing a body scan. To do this, close your eyes, focus on one area of your body at a time from head to toe, and notice which parts of your body are tense. Try to relax the muscles in those areas.  Download or buy apps on your mobile phone or tablet that can help you stick to your quit plan. There are many free apps, such as QuitGuide from the CDC (Centers for Disease Control and Prevention). You can find more support from smokefree.gov and other websites.  This information is not intended to replace advice given to you by your health care provider. Make sure you discuss any questions you have with your health care provider. Document Released: 07/07/2009 Document   Revised: 05/08/2016 Document Reviewed: 01/25/2015 Elsevier Interactive Patient Education  2018 Elsevier Inc.  

## 2018-09-24 DIAGNOSIS — R079 Chest pain, unspecified: Secondary | ICD-10-CM

## 2018-09-24 HISTORY — DX: Chest pain, unspecified: R07.9

## 2018-09-28 ENCOUNTER — Emergency Department (HOSPITAL_COMMUNITY): Payer: Self-pay

## 2018-09-28 ENCOUNTER — Other Ambulatory Visit: Payer: Self-pay

## 2018-09-28 ENCOUNTER — Encounter (HOSPITAL_COMMUNITY): Payer: Self-pay | Admitting: Emergency Medicine

## 2018-09-28 ENCOUNTER — Observation Stay (HOSPITAL_COMMUNITY)
Admission: EM | Admit: 2018-09-28 | Discharge: 2018-09-30 | Disposition: A | Discharge status: Home / Self Care | Payer: Self-pay | Attending: Emergency Medicine | Admitting: Emergency Medicine

## 2018-09-28 DIAGNOSIS — F419 Anxiety disorder, unspecified: Secondary | ICD-10-CM | POA: Insufficient documentation

## 2018-09-28 DIAGNOSIS — I10 Essential (primary) hypertension: Secondary | ICD-10-CM | POA: Insufficient documentation

## 2018-09-28 DIAGNOSIS — E785 Hyperlipidemia, unspecified: Secondary | ICD-10-CM | POA: Insufficient documentation

## 2018-09-28 DIAGNOSIS — Z720 Tobacco use: Secondary | ICD-10-CM | POA: Diagnosis present

## 2018-09-28 DIAGNOSIS — F411 Generalized anxiety disorder: Secondary | ICD-10-CM

## 2018-09-28 DIAGNOSIS — I200 Unstable angina: Secondary | ICD-10-CM | POA: Insufficient documentation

## 2018-09-28 DIAGNOSIS — F17210 Nicotine dependence, cigarettes, uncomplicated: Secondary | ICD-10-CM | POA: Insufficient documentation

## 2018-09-28 DIAGNOSIS — R079 Chest pain, unspecified: Secondary | ICD-10-CM

## 2018-09-28 DIAGNOSIS — I2510 Atherosclerotic heart disease of native coronary artery without angina pectoris: Secondary | ICD-10-CM | POA: Insufficient documentation

## 2018-09-28 DIAGNOSIS — Z7982 Long term (current) use of aspirin: Secondary | ICD-10-CM | POA: Insufficient documentation

## 2018-09-28 DIAGNOSIS — Z79899 Other long term (current) drug therapy: Secondary | ICD-10-CM | POA: Insufficient documentation

## 2018-09-28 DIAGNOSIS — Z955 Presence of coronary angioplasty implant and graft: Secondary | ICD-10-CM

## 2018-09-28 DIAGNOSIS — R0789 Other chest pain: Principal | ICD-10-CM | POA: Insufficient documentation

## 2018-09-28 HISTORY — DX: Chest pain, unspecified: R07.9

## 2018-09-28 LAB — BASIC METABOLIC PANEL
Anion gap: 8 (ref 5–15)
BUN: 12 mg/dL (ref 6–20)
CHLORIDE: 103 mmol/L (ref 98–111)
CO2: 25 mmol/L (ref 22–32)
Calcium: 9.5 mg/dL (ref 8.9–10.3)
Creatinine, Ser: 1.13 mg/dL (ref 0.61–1.24)
GFR calc Af Amer: >60 mL/min (ref >60)
GFR calc non Af Amer: >60 mL/min (ref >60)
Glucose, Bld: 102 mg/dL — ABNORMAL HIGH (ref 70–99)
POTASSIUM: 3.3 mmol/L — AB (ref 3.5–5.1)
Sodium: 136 mmol/L (ref 135–145)

## 2018-09-28 LAB — CBC
HEMATOCRIT: 42.7 % (ref 39.0–52.0)
HEMOGLOBIN: 14.4 g/dL (ref 13.0–17.0)
MCH: 30.3 pg (ref 26.0–34.0)
MCHC: 33.7 g/dL (ref 30.0–36.0)
MCV: 89.9 fL (ref 80.0–100.0)
Platelets: 305 10*3/uL (ref 150–400)
RBC: 4.75 MIL/uL (ref 4.22–5.81)
RDW: 13.2 % (ref 11.5–15.5)
WBC: 9.6 10*3/uL (ref 4.0–10.5)
nRBC: 0 % (ref 0.0–0.2)

## 2018-09-28 LAB — I-STAT TROPONIN, ED: Troponin i, poc: 0 ng/mL (ref 0.00–0.08)

## 2018-09-28 MED ORDER — ASPIRIN 81 MG PO CHEW
243.0000 mg | CHEWABLE_TABLET | Freq: Once | ORAL | Status: DC
  Filled 2018-09-28: qty 3

## 2018-09-28 MED ORDER — HEPARIN (PORCINE) 25000 UT/250ML-% IV SOLN
1350.0000 [IU]/h | INTRAVENOUS | Status: DC
  Administered 2018-09-28 – 2018-09-29 (×2): 1200 [IU]/h via INTRAVENOUS
  Filled 2018-09-28 (×2): qty 250

## 2018-09-28 MED ORDER — MORPHINE SULFATE (PF) 4 MG/ML IV SOLN
4.0000 mg | Freq: Once | INTRAVENOUS | Status: AC
  Administered 2018-09-28: 4 mg via INTRAVENOUS
  Filled 2018-09-28: qty 1

## 2018-09-28 MED ORDER — HEPARIN BOLUS VIA INFUSION
4000.0000 [IU] | Freq: Once | INTRAVENOUS | Status: AC
  Administered 2018-09-28: 4000 [IU] via INTRAVENOUS
  Filled 2018-09-28: qty 4000

## 2018-09-28 MED ORDER — ENOXAPARIN SODIUM 100 MG/ML ~~LOC~~ SOLN
100.0000 mg | Freq: Once | SUBCUTANEOUS | Status: DC
  Filled 2018-09-28: qty 1

## 2018-09-28 MED ORDER — NITROGLYCERIN 2 % TD OINT
1.0000 [in_us] | TOPICAL_OINTMENT | Freq: Four times a day (QID) | TRANSDERMAL | Status: DC
  Administered 2018-09-28 – 2018-09-29 (×2): 1 [in_us] via TOPICAL
  Filled 2018-09-28: qty 1
  Filled 2018-09-28: qty 30
  Filled 2018-09-28: qty 1

## 2018-09-28 MED ORDER — ASPIRIN 81 MG PO CHEW
324.0000 mg | CHEWABLE_TABLET | Freq: Once | ORAL | Status: AC
  Administered 2018-09-28: 324 mg via ORAL

## 2018-09-28 MED ORDER — NITROGLYCERIN 0.4 MG SL SUBL
0.4000 mg | SUBLINGUAL_TABLET | SUBLINGUAL | Status: DC | PRN
  Administered 2018-09-28 (×3): 0.4 mg via SUBLINGUAL
  Filled 2018-09-28: qty 1

## 2018-09-28 NOTE — ED Notes (Signed)
Heparin verified with Arlys John RN

## 2018-09-28 NOTE — Progress Notes (Signed)
ANTICOAGULATION CONSULT NOTE - Initial Consult  Pharmacy Consult for Heparin  Indication: chest pain/ACS  Allergies  Allergen Reactions  . Bactrim [Sulfamethoxazole-Trimethoprim] Other (See Comments)    Felt Drunk  . Lipitor [Atorvastatin] Other (See Comments)    Stiffness in joints  . Pravastatin Other (See Comments)    stiffness    Vital Signs: Temp: 97.9 F (36.6 C) (01/05 2043) Temp Source: Oral (01/05 2043) BP: 125/77 (01/05 2315) Pulse Rate: 56 (01/05 2315)  Labs: Recent Labs    09/28/18 2040  HGB 14.4  HCT 42.7  PLT 305  CREATININE 1.13    CrCl cannot be calculated (Unknown ideal weight.).   Medical History: Past Medical History:  Diagnosis Date  . Anxiety   . Coronary artery disease   . Depression   . GAD (generalized anxiety disorder)   . GERD (gastroesophageal reflux disease)   . History of hiatal hernia   . Hyperlipidemia   . NSTEMI (non-ST elevated myocardial infarction) (HCC) 10/30/2016    Assessment: 48 y/o M to begin IV heparin for CP, hx PCI, CBC/renal function good, PTA meds reviewed.   Goal of Therapy:  Heparin level 0.3-0.7 units/ml Monitor platelets by anticoagulation protocol: Yes   Plan:  Heparin 4000 units BOLUS Start heparin drip at 1200 units/hr 0800 HL Daily CBC/HL Monitor for bleeding   Abran Duke 09/28/2018,11:35 PM

## 2018-09-28 NOTE — H&P (Addendum)
History and Physical   BORDEN RANDLETT ZGY:[id number] DOB: [birth year] DOA: 09/28/2018  Referring MD/NP/PA: Dr. Iantha Fallen  PCP: Babs Sciara, MD   Outpatient Specialist: Dr Eldridge Dace, Cardiology  Patient coming from: Home  Chief Complaint: Chest pain  HPI: Bruce King is a 48 y.o. male with medical history significant of coronary artery disease status post previous stenting, GERD, depression with anxiety, hypertension, hyperlipidemia who presents to the ER with central chest pain rated as 7 out of 10.  He has been having on and off for about a week.  Associated with elevated blood pressure and headache.  This is getting worse today.  The pain radiates to his left jaw.  Not relieved by his home nitroglycerin.  Patient continues to smoke cigarettes despite his previous MI.  In the ER initial evaluation showed no evidence of ST elevation MI.  Enzymes are negative and EKG stable.  Pain was relieved with nitroglycerin and morphine.  Patient is being admitted for MI rule out.  ED Course: Temperature is 97.9 blood pressure 182/112 pulse 55 respiratory rate of 17 oxygen sat 95% room air.  CBC essentially normal potassium 3.3 otherwise chemistry appears to be within normal.  Chest x-ray showed no active disease.  EKG is unchanged.  Patient is being admitted for rule out MI.  Review of Systems: As per HPI otherwise 10 point review of systems negative.    Past Medical History:  Diagnosis Date  . Anxiety   . Coronary artery disease   . Depression   . GAD (generalized anxiety disorder)   . GERD (gastroesophageal reflux disease)   . History of hiatal hernia   . Hyperlipidemia   . NSTEMI (non-ST elevated myocardial infarction) (HCC) 10/30/2016    Past Surgical History:  Procedure Laterality Date  . CORONARY ANGIOPLASTY WITH STENT PLACEMENT  10/31/2016   "3 stents"  . CORONARY STENT INTERVENTION N/A 10/31/2016   Procedure: Coronary Stent Intervention;  Surgeon: Iran Ouch, MD;   Location: [facility];  Service: Cardiovascular;  Laterality: N/A;  . FRACTURE SURGERY    . LEFT HEART CATH AND CORONARY ANGIOGRAPHY N/A 10/31/2016   Procedure: Left Heart Cath and Coronary Angiography;  Surgeon: Iran Ouch, MD;  Location: [facility];  Service: Cardiovascular;  Laterality: N/A;  . ORIF TIBIA & FIBULA FRACTURES Right ~ 05/1996   "titanium rod"     reports that he has been smoking cigarettes. He started smoking about 29 years ago. He has a 25.00 pack-year smoking history. He has quit using smokeless tobacco.  His smokeless tobacco use included chew and snuff. He reports current alcohol use of about 14.0 standard drinks of alcohol per week. He reports that he does not use drugs.  Allergies  Allergen Reactions  . Bactrim [Sulfamethoxazole-Trimethoprim] Other (See Comments)    Felt Drunk  . Lipitor [Atorvastatin] Other (See Comments)    Stiffness in joints  . Pravastatin Other (See Comments)    stiffness    Family History  Problem Relation Age of Onset  . Hypertension Mother   . Heart attack Father      Prior to Admission medications   Medication Sig Start Date End Date Taking? Authorizing Provider  acetaminophen (TYLENOL) 500 MG tablet Take 1,000 mg by mouth every 6 (six) hours as needed.   Yes [provider]  ALPRAZolam (XANAX) 0.5 MG tablet 1 qid prn Patient taking differently: Take 0.5 mg by mouth 4 (four) times daily as needed for anxiety.  09/02/18  Yes Babs SciaraLuking, Scott A, MD  aspirin 81 MG tablet Take 81 mg by mouth daily.    Yes [provider]  atorvastatin (LIPITOR) 20 MG tablet Take 1 tablet (20 mg total) by mouth daily at 6 PM. Patient taking differently: Take 20 mg by mouth daily.  09/02/18  Yes Babs SciaraLuking, Scott A, MD  ezetimibe (ZETIA) 10 MG tablet Take 1 tablet (10 mg total) by mouth daily. 11/09/16  Yes Jodelle GrossLawrence, Kathryn M, NP  losartan (COZAAR) 50 MG tablet Take 1 tablet (50 mg total) by mouth daily. 09/02/18  Yes Babs SciaraLuking,  Scott A, MD  metoprolol tartrate (LOPRESSOR) 50 MG tablet TAKE  (1)  TABLET TWICE A DAY. Patient taking differently: Take 50 mg by mouth 2 (two) times daily.  08/08/18  Yes Corky CraftsVaranasi, Jayadeep S, MD  nitroGLYCERIN (NITROSTAT) 0.4 MG SL tablet Place 1 tablet (0.4 mg total) every 5 (five) minutes as needed under the tongue for chest pain. 08/06/17  Yes Corky CraftsVaranasi, Jayadeep S, MD  sertraline (ZOLOFT) 100 MG tablet Take 1 tablet (100 mg total) by mouth daily. 09/02/18  Yes Babs SciaraLuking, Scott A, MD  ticagrelor (BRILINTA) 90 MG TABS tablet Take 1 tablet (90 mg total) by mouth 2 (two) times daily. 11/09/16  Yes Jodelle GrossLawrence, Kathryn M, NP    Physical Exam: Vitals:   09/28/18 2115 09/28/18 2130 09/28/18 2200 09/28/18 2215  BP: (!) 158/108 (!) 146/77 127/78 135/84  Pulse: 62 61 (!) 58 63  Resp: 16 16 17 10   Temp:      TempSrc:      SpO2: 98% 99% 97% 95%      Constitutional: NAD, calm, comfortable Vitals:   09/28/18 2115 09/28/18 2130 09/28/18 2200 09/28/18 2215  BP: (!) 158/108 (!) 146/77 127/78 135/84  Pulse: 62 61 (!) 58 63  Resp: 16 16 17 10   Temp:      TempSrc:      SpO2: 98% 99% 97% 95%   Eyes: PERRL, lids and conjunctivae normal ENMT: Mucous membranes are moist. Posterior pharynx clear of any exudate or lesions.Normal dentition.  Neck: normal, supple, no masses, no thyromegaly Respiratory: clear to auscultation bilaterally, no wheezing, no crackles. Normal respiratory effort. No accessory muscle use.  Cardiovascular: Regular rate and rhythm, no murmurs / rubs / gallops. No extremity edema. 2+ pedal pulses. No carotid bruits.  Abdomen: no tenderness, no masses palpated. No hepatosplenomegaly. Bowel sounds positive.  Musculoskeletal: no clubbing / cyanosis. No joint deformity upper and lower extremities. Good ROM, no contractures. Normal muscle tone.  Skin: no rashes, lesions, ulcers. No induration Neurologic: CN 2-12 grossly intact. Sensation intact, DTR normal. Strength 5/5 in all 4.    Psychiatric: Normal judgment and insight. Alert and oriented x 3. Normal mood.     Labs on Admission: I have personally reviewed following labs and imaging studies  CBC: Recent Labs  Lab 09/28/18 2040  WBC 9.6  HGB 14.4  HCT 42.7  MCV 89.9  PLT 305   Basic Metabolic Panel: Recent Labs  Lab 09/28/18 2040  NA 136  K 3.3*  CL 103  CO2 25  GLUCOSE 102*  BUN 12  CREATININE 1.13  CALCIUM 9.5   GFR: CrCl cannot be calculated (Unknown ideal weight.). Liver Function Tests: No results for input(s): AST, ALT, ALKPHOS, BILITOT, PROT, ALBUMIN in the last 168 hours. No results for input(s): LIPASE, AMYLASE in the last 168 hours. No results for input(s): AMMONIA in the last 168 hours. Coagulation Profile: No results for input(s): INR,  PROTIME in the last 168 hours. Cardiac Enzymes: No results for input(s): CKTOTAL, CKMB, CKMBINDEX, TROPONINI in the last 168 hours. BNP (last 3 results) No results for input(s): PROBNP in the last 8760 hours. HbA1C: No results for input(s): HGBA1C in the last 72 hours. CBG: No results for input(s): GLUCAP in the last 168 hours. Lipid Profile: No results for input(s): CHOL, HDL, LDLCALC, TRIG, CHOLHDL, LDLDIRECT in the last 72 hours. Thyroid Function Tests: No results for input(s): TSH, T4TOTAL, FREET4, T3FREE, THYROIDAB in the last 72 hours. Anemia Panel: No results for input(s): VITAMINB12, FOLATE, FERRITIN, TIBC, IRON, RETICCTPCT in the last 72 hours. Urine analysis: No results found for: COLORURINE, APPEARANCEUR, LABSPEC, PHURINE, GLUCOSEU, HGBUR, BILIRUBINUR, KETONESUR, PROTEINUR, UROBILINOGEN, NITRITE, LEUKOCYTESUR Sepsis Labs: @LABRCNTIP (procalcitonin:4,lacticidven:4) )No results found for this or any previous visit (from the past 240 hour(s)).   Radiological Exams on Admission: Dg Chest 2 View  Result Date: 09/28/2018 CLINICAL DATA:  Chest pain and shortness of breath. EXAM: CHEST - 2 VIEW COMPARISON:  10/30/2016 FINDINGS: The  cardiomediastinal contours are normal. Mild linear scarring in the lingula. Pulmonary vasculature is normal. No consolidation, pleural effusion, or pneumothorax. No acute osseous abnormalities are seen. IMPRESSION: No acute chest findings. Electronically Signed   By: Narda RutherfordMelanie  Sanford M.D.   On: 09/28/2018 21:41    EKG: Independently reviewed.  It shows normal sinus rhythm with a rate of 60 normal intervals.  Evidence of left ventricular hypertrophy otherwise no significant ST changes.  Assessment/Plan Principal Problem:   Chest pain Active Problems:   Generalized anxiety disorder   Hyperlipidemia   Tobacco use   Status post coronary artery stent placement   CAD (coronary artery disease)   Essential hypertension     #1 chest pain: Probably angina with patient's history of coronary artery disease and ongoing tobacco abuse.  He had 3 stents placed last year.  Admit the patient to cardiac telemetry.  Placed on heparin and nitroglycerin.  Serial enzymes to be checked.  Patient may be a candidate for cardiac stress testing if enzymes are negative.  We may consider also cardiology consultation depending on the outcome.  #2 tobacco abuse: Nicotine patch will be added antibiotic cessation counseling given.  #3 hyperlipidemia: We will continue with statin.  #4 hypertension: Continue with home regimen again.  #5 anxiety disorder: Counseling provided and will continue with home regimen.  #6 Hypokalemia: Will replete   DVT prophylaxis: Heparin drip Code Status: Full code Family Communication: Discussed care with patient at length no family at bedside Disposition Plan: Home Consults called: None Admission status: Observation  Severity of Illness: The appropriate patient status for this patient is OBSERVATION. Observation status is judged to be reasonable and necessary in order to provide the required intensity of service to ensure the patient's safety. The patient's presenting symptoms,  physical exam findings, and initial radiographic and laboratory data in the context of their medical condition is felt to place them at decreased risk for further clinical deterioration. Furthermore, it is anticipated that the patient will be medically stable for discharge from the hospital within 2 midnights of admission. The following factors support the patient status of observation.   " The patient's presenting symptoms include chest pain. " The physical exam findings include no significant finding on exam. " The initial radiographic and laboratory data are normal EKG and chest x-ray.     Lonia BloodGARBA,LAWAL MD [facility] Pager [phone]  If 7PM-7AM, please contact night-coverage [email or web address] Password [email or web address]  09/28/2018, 11:14 PM

## 2018-09-28 NOTE — ED Provider Notes (Signed)
[facility]   CSN: [id number] Arrival date & time: 09/28/18  2034     History   Chief Complaint Chief Complaint  Patient presents with  . Chest Pain    HPI Bruce King is a 48 y.o. male.  HPI Patient presents to the emergency room for evaluation of chest pain.  Patient states for the last several days he has had some issues with hypertension as well as a headache.  Today however he started having pain in his chest.  It goes to the back as well as his jaw.  Patient has a history of coronary artery disease and an n STEMI.  Patient states the symptoms today are slightly different from his prior anginal symptoms.  He denies any shortness of breath or nausea.  Patient unfortunately does continue to smoke cigarettes.  Denies any fevers or chills.  No vomiting or diarrhea.  Cannot tell for Past Medical History:  Diagnosis Date  . Anxiety   . Coronary artery disease   . Depression   . GAD (generalized anxiety disorder)   . GERD (gastroesophageal reflux disease)   . History of hiatal hernia   . Hyperlipidemia   . NSTEMI (non-ST elevated myocardial infarction) (HCC) 10/30/2016    Patient Active Problem List   Diagnosis Date Noted  . Erectile dysfunction 01/22/2018  . CAD (coronary artery disease) 02/07/2017  . Old MI (myocardial infarction) 02/07/2017  . Essential hypertension 02/07/2017  . Status post coronary artery stent placement   . Tobacco abuse   . Depression with anxiety 10/31/2016  . Tobacco use 10/31/2016  . NSTEMI (non-ST elevated myocardial infarction) (HCC) 10/31/2016  . Chest pain 09/28/2014  . Generalized anxiety disorder 03/02/2013  . Hyperlipidemia 03/02/2013    Past Surgical History:  Procedure Laterality Date  . CORONARY ANGIOPLASTY WITH STENT PLACEMENT  10/31/2016   "3 stents"  . CORONARY STENT INTERVENTION N/A 10/31/2016   Procedure: Coronary Stent Intervention;  Surgeon: Iran OuchMuhammad A Arida, MD;   Location: [facility];  Service: Cardiovascular;  Laterality: N/A;  . FRACTURE SURGERY    . LEFT HEART CATH AND CORONARY ANGIOGRAPHY N/A 10/31/2016   Procedure: Left Heart Cath and Coronary Angiography;  Surgeon: Iran OuchMuhammad A Arida, MD;  Location: [facility];  Service: Cardiovascular;  Laterality: N/A;  . ORIF TIBIA & FIBULA FRACTURES Right ~ 05/1996   "titanium rod"        Home Medications    Prior to Admission medications   Medication Sig Start Date End Date Taking? Authorizing Provider  acetaminophen (TYLENOL) 500 MG tablet Take 1,000 mg by mouth every 6 (six) hours as needed.   Yes [provider]  ALPRAZolam (XANAX) 0.5 MG tablet 1 qid prn Patient taking differently: Take 0.5 mg by mouth 4 (four) times daily as needed for anxiety.  09/02/18  Yes Babs SciaraLuking, Scott A, MD  aspirin 81 MG tablet Take 81 mg by mouth daily.    Yes [provider]  atorvastatin (LIPITOR) 20 MG tablet Take 1 tablet (20 mg total) by mouth daily at 6 PM. Patient taking differently: Take 20 mg by mouth daily.  09/02/18  Yes Babs SciaraLuking, Scott A, MD  ezetimibe (ZETIA) 10 MG tablet Take 1 tablet (10 mg total) by mouth daily. 11/09/16  Yes Jodelle GrossLawrence, Kathryn M, NP  losartan (COZAAR) 50 MG tablet Take 1 tablet (50 mg total) by mouth daily. 09/02/18  Yes Babs SciaraLuking, Scott A, MD  metoprolol tartrate (LOPRESSOR) 50 MG  tablet TAKE  (1)  TABLET TWICE A DAY. Patient taking differently: Take 50 mg by mouth 2 (two) times daily.  08/08/18  Yes Corky CraftsVaranasi, Jayadeep S, MD  nitroGLYCERIN (NITROSTAT) 0.4 MG SL tablet Place 1 tablet (0.4 mg total) every 5 (five) minutes as needed under the tongue for chest pain. 08/06/17  Yes Corky CraftsVaranasi, Jayadeep S, MD  sertraline (ZOLOFT) 100 MG tablet Take 1 tablet (100 mg total) by mouth daily. 09/02/18  Yes Babs SciaraLuking, Scott A, MD  ticagrelor (BRILINTA) 90 MG TABS tablet Take 1 tablet (90 mg total) by mouth 2 (two) times daily. 11/09/16  Yes Jodelle GrossLawrence, Kathryn M, NP    Family  History Family History  Problem Relation Age of Onset  . Hypertension Mother   . Heart attack Father     Social History Social History   Tobacco Use  . Smoking status: Current Every Day Smoker    Packs/day: 1.00    Years: 25.00    Pack years: 25.00    Types: Cigarettes    Start date: 09/28/1989    Last attempt to quit: 10/29/2016    Years since quitting: 1.9  . Smokeless tobacco: Former NeurosurgeonUser    Types: Chew, Snuff  Substance Use Topics  . Alcohol use: Yes    Alcohol/week: 14.0 standard drinks    Types: 14 Cans of beer per week    Comment: 10/31/2016 "only drink on Fridays"  . Drug use: No     Allergies   Bactrim [sulfamethoxazole-trimethoprim]; Lipitor [atorvastatin]; and Pravastatin   Review of Systems Review of Systems  All other systems reviewed and are negative.    Physical Exam Updated Vital Signs BP 135/84   Pulse 63   Temp 97.9 F (36.6 C) (Oral)   Resp 10   SpO2 95%   Physical Exam Vitals signs and nursing note reviewed.  Constitutional:      General: He is not in acute distress.    Appearance: He is well-developed.  HENT:     Head: Normocephalic and atraumatic.     Right Ear: External ear normal.     Left Ear: External ear normal.  Eyes:     General: No scleral icterus.       Right eye: No discharge.        Left eye: No discharge.     Conjunctiva/sclera: Conjunctivae normal.  Neck:     Musculoskeletal: Neck supple.     Trachea: No tracheal deviation.  Cardiovascular:     Rate and Rhythm: Normal rate and regular rhythm.  Pulmonary:     Effort: Pulmonary effort is normal. No respiratory distress.     Breath sounds: Normal breath sounds. No stridor. No wheezing or rales.  Abdominal:     General: Bowel sounds are normal. There is no distension.     Palpations: Abdomen is soft.     Tenderness: There is no abdominal tenderness. There is no guarding or rebound.  Musculoskeletal:        General: No tenderness.  Skin:    General: Skin is warm and  dry.     Findings: No rash.  Neurological:     Mental Status: He is alert.     Cranial Nerves: No cranial nerve deficit (no facial droop, extraocular movements intact, no slurred speech).     Sensory: No sensory deficit.     Motor: No abnormal muscle tone or seizure activity.     Coordination: Coordination normal.      ED Treatments / Results  Labs (all labs ordered are listed, but only abnormal results are displayed) Labs Reviewed  BASIC METABOLIC PANEL - Abnormal; Notable for the following components:      Result Value   Potassium 3.3 (*)    Glucose, Bld 102 (*)    All other components within normal limits  CBC  I-STAT TROPONIN, ED    EKG EKG Interpretation  Date/Time:  Sunday September 28 2018 20:40:15 EST Ventricular Rate:  60 PR Interval:  174 QRS Duration: 106 QT Interval:  412 QTC Calculation: 412 R Axis:   54 Text Interpretation:  Normal sinus rhythm Nonspecific ST abnormality Abnormal ECG t wave abnormality on prior ECG resolved compared to current ECG Confirmed by Linwood Dibbles [id number]) on 09/28/2018 8:54:46 PM   Radiology Dg Chest 2 View  Result Date: 09/28/2018 CLINICAL DATA:  Chest pain and shortness of breath. EXAM: CHEST - 2 VIEW COMPARISON:  10/30/2016 FINDINGS: The cardiomediastinal contours are normal. Mild linear scarring in the lingula. Pulmonary vasculature is normal. No consolidation, pleural effusion, or pneumothorax. No acute osseous abnormalities are seen. IMPRESSION: No acute chest findings. Electronically Signed   By: Narda Rutherford M.D.   On: 09/28/2018 21:41    Procedures Procedures (including critical care time)  Medications Ordered in ED Medications  nitroGLYCERIN (NITROSTAT) SL tablet 0.4 mg (0.4 mg Sublingual Given 09/28/18 2201)  nitroGLYCERIN (NITROGLYN) 2 % ointment 1 inch (1 inch Topical Given 09/28/18 2206)  aspirin chewable tablet 324 mg (324 mg Oral Given 09/28/18 2115)  morphine 4 MG/ML injection 4 mg (4 mg Intravenous Given 09/28/18 2208)      Initial Impression / Assessment and Plan / ED Course  I have reviewed the triage vital signs and the nursing notes.  Pertinent labs & imaging results that were available during my care of the patient were reviewed by me and considered in my medical decision making (see chart for details).   Patient presented to the emergency room for evaluation of chest pain.  Patient has a known history of coronary artery disease.  His symptoms are concerning for recurrent unstable angina.  Patient was treated with aspirin, nitroglycerin and morphine.  His symptoms have improved.  Initial troponin is normal however the patient is high risk and he will need to be admitted for further evaluation.  Final Clinical Impressions(s) / ED Diagnoses   Final diagnoses:  Chest pain, unspecified type      Linwood Dibbles, MD 09/28/18 2240

## 2018-09-28 NOTE — ED Triage Notes (Addendum)
C/o intermittent headache x 3-4 days, elevated BP, and SOB x 2 days.  C/o chest pain and R jaw pain x 2 hours.  Also reports feeling lightheaded.  Denies nausea and vomiting.

## 2018-09-29 ENCOUNTER — Encounter (HOSPITAL_COMMUNITY): Payer: Self-pay | Admitting: General Practice

## 2018-09-29 ENCOUNTER — Other Ambulatory Visit: Payer: Self-pay

## 2018-09-29 DIAGNOSIS — Z720 Tobacco use: Secondary | ICD-10-CM

## 2018-09-29 DIAGNOSIS — E785 Hyperlipidemia, unspecified: Secondary | ICD-10-CM

## 2018-09-29 LAB — HEPARIN LEVEL (UNFRACTIONATED)
Heparin Unfractionated: 0.65 IU/mL (ref 0.30–0.70)
Heparin Unfractionated: 0.27 IU/mL — ABNORMAL LOW (ref 0.30–0.70)

## 2018-09-29 LAB — CBC
HCT: 39.7 % (ref 39.0–52.0)
Hemoglobin: 13.4 g/dL (ref 13.0–17.0)
MCH: 30.8 pg (ref 26.0–34.0)
MCHC: 33.8 g/dL (ref 30.0–36.0)
MCV: 91.3 fL (ref 80.0–100.0)
Platelets: 299 10*3/uL (ref 150–400)
RBC: 4.35 MIL/uL (ref 4.22–5.81)
RDW: 13.2 % (ref 11.5–15.5)
WBC: 9.4 10*3/uL (ref 4.0–10.5)
nRBC: 0 % (ref 0.0–0.2)

## 2018-09-29 LAB — TROPONIN I
Troponin I: <0.03 ng/mL (ref <0.03)
Troponin I: <0.03 ng/mL (ref <0.03)
Troponin I: <0.03 ng/mL (ref <0.03)

## 2018-09-29 LAB — HIV ANTIBODY (ROUTINE TESTING W REFLEX): HIV Screen 4th Generation wRfx: NONREACTIVE

## 2018-09-29 MED ORDER — ASPIRIN EC 81 MG PO TBEC
81.0000 mg | DELAYED_RELEASE_TABLET | Freq: Every day | ORAL | Status: DC
  Administered 2018-09-29: 81 mg via ORAL
  Filled 2018-09-29 (×2): qty 1

## 2018-09-29 MED ORDER — SODIUM CHLORIDE 0.9% FLUSH: 3.0000 mL | INTRAVENOUS | Status: DC | PRN

## 2018-09-29 MED ORDER — SERTRALINE HCL 100 MG PO TABS
100.0000 mg | ORAL_TABLET | Freq: Every day | ORAL | Status: DC
  Administered 2018-09-29 – 2018-09-30 (×2): 100 mg via ORAL
  Filled 2018-09-29 (×3): qty 1

## 2018-09-29 MED ORDER — ACETAMINOPHEN 500 MG PO TABS
1000.0000 mg | ORAL_TABLET | Freq: Four times a day (QID) | ORAL | Status: DC | PRN
  Administered 2018-09-29: 1000 mg via ORAL
  Filled 2018-09-29: qty 2

## 2018-09-29 MED ORDER — SODIUM CHLORIDE 0.9 % WEIGHT BASED INFUSION: 1.0000 mL/kg/h | INTRAVENOUS | Status: DC

## 2018-09-29 MED ORDER — ALPRAZOLAM 0.5 MG PO TABS
0.5000 mg | ORAL_TABLET | Freq: Four times a day (QID) | ORAL | Status: DC | PRN
  Administered 2018-09-29 – 2018-09-30 (×5): 0.5 mg via ORAL
  Filled 2018-09-29 (×3): qty 1
  Filled 2018-09-29: qty 2
  Filled 2018-09-29 (×2): qty 1

## 2018-09-29 MED ORDER — SODIUM CHLORIDE 0.9 % WEIGHT BASED INFUSION
3.0000 mL/kg/h | INTRAVENOUS | Status: DC
  Administered 2018-09-30: 3 mL/kg/h via INTRAVENOUS

## 2018-09-29 MED ORDER — NITROGLYCERIN 0.4 MG SL SUBL: 0.4000 mg | SUBLINGUAL_TABLET | SUBLINGUAL | Status: DC | PRN

## 2018-09-29 MED ORDER — SODIUM CHLORIDE 0.9 % IV SOLN: 250.0000 mL | INTRAVENOUS | Status: DC | PRN

## 2018-09-29 MED ORDER — EZETIMIBE 10 MG PO TABS
10.0000 mg | ORAL_TABLET | Freq: Every day | ORAL | Status: DC
  Administered 2018-09-29 – 2018-09-30 (×2): 10 mg via ORAL
  Filled 2018-09-29 (×3): qty 1

## 2018-09-29 MED ORDER — MORPHINE SULFATE (PF) 2 MG/ML IV SOLN: 1.0000 mg | INTRAVENOUS | Status: DC | PRN

## 2018-09-29 MED ORDER — ZOLPIDEM TARTRATE 5 MG PO TABS: 5.0000 mg | ORAL_TABLET | Freq: Every evening | ORAL | Status: DC | PRN

## 2018-09-29 MED ORDER — NICOTINE 21 MG/24HR TD PT24
21.0000 mg | MEDICATED_PATCH | Freq: Every day | TRANSDERMAL | Status: DC
  Filled 2018-09-29: qty 1

## 2018-09-29 MED ORDER — ONDANSETRON HCL 4 MG/2ML IJ SOLN: 4.0000 mg | Freq: Four times a day (QID) | INTRAMUSCULAR | Status: DC | PRN

## 2018-09-29 MED ORDER — TICAGRELOR 90 MG PO TABS
90.0000 mg | ORAL_TABLET | Freq: Two times a day (BID) | ORAL | Status: DC
  Administered 2018-09-29 – 2018-09-30 (×4): 90 mg via ORAL
  Filled 2018-09-29 (×4): qty 1

## 2018-09-29 MED ORDER — ACETAMINOPHEN 325 MG PO TABS: 650.0000 mg | ORAL_TABLET | ORAL | Status: DC | PRN

## 2018-09-29 MED ORDER — METOPROLOL TARTRATE 50 MG PO TABS
50.0000 mg | ORAL_TABLET | Freq: Two times a day (BID) | ORAL | Status: DC
  Administered 2018-09-29 – 2018-09-30 (×4): 50 mg via ORAL
  Filled 2018-09-29: qty 1
  Filled 2018-09-29: qty 2
  Filled 2018-09-29 (×2): qty 1

## 2018-09-29 MED ORDER — MORPHINE SULFATE (PF) 2 MG/ML IV SOLN
0.5000 mg | Freq: Once | INTRAVENOUS | Status: AC
  Administered 2018-09-29: 0.5 mg via INTRAVENOUS
  Filled 2018-09-29: qty 1

## 2018-09-29 MED ORDER — ATORVASTATIN CALCIUM 10 MG PO TABS: 20.0000 mg | ORAL_TABLET | Freq: Every day | ORAL | Status: DC

## 2018-09-29 MED ORDER — POTASSIUM CHLORIDE CRYS ER 20 MEQ PO TBCR
40.0000 meq | EXTENDED_RELEASE_TABLET | Freq: Once | ORAL | Status: AC
  Administered 2018-09-29: 40 meq via ORAL
  Filled 2018-09-29: qty 2

## 2018-09-29 MED ORDER — HYDROCODONE-ACETAMINOPHEN 5-325 MG PO TABS: 1.0000 | ORAL_TABLET | Freq: Four times a day (QID) | ORAL | Status: DC | PRN

## 2018-09-29 MED ORDER — LOSARTAN POTASSIUM 50 MG PO TABS
50.0000 mg | ORAL_TABLET | Freq: Every day | ORAL | Status: DC
  Administered 2018-09-29 – 2018-09-30 (×2): 50 mg via ORAL
  Filled 2018-09-29 (×3): qty 1

## 2018-09-29 MED ORDER — ATORVASTATIN CALCIUM 10 MG PO TABS
20.0000 mg | ORAL_TABLET | Freq: Every day | ORAL | Status: DC
  Administered 2018-09-29 (×2): 20 mg via ORAL
  Filled 2018-09-29 (×3): qty 2

## 2018-09-29 MED ORDER — SODIUM CHLORIDE 0.9% FLUSH: 3.0000 mL | Freq: Two times a day (BID) | INTRAVENOUS | Status: DC

## 2018-09-29 MED ORDER — ASPIRIN 81 MG PO CHEW
81.0000 mg | CHEWABLE_TABLET | ORAL | Status: AC
  Administered 2018-09-30: 81 mg via ORAL
  Filled 2018-09-29: qty 1

## 2018-09-29 NOTE — Progress Notes (Signed)
ANTICOAGULATION CONSULT NOTE  Pharmacy Consult for Heparin  Indication: chest pain/ACS  Vital Signs: Temp: 97.4 F (36.3 C) (01/06 0556) Temp Source: Oral (01/06 0556) BP: 120/87 (01/06 0800) Pulse Rate: 60 (01/06 0800)  Labs: Recent Labs    09/28/18 2040 09/29/18 0024 09/29/18 0550 09/29/18 0809  HGB 14.4  --  13.4  --   HCT 42.7  --  39.7  --   PLT 305  --  299  --   HEPARINUNFRC  --   --   --  0.65  CREATININE 1.13  --   --   --   TROPONINI  --  <0.03 <0.03  --     CrCl cannot be calculated (Unknown ideal weight.).  Assessment: 48 y/o M to begin IV heparin for CP. Initial heparin level is therapeutic. CBC is WNL and no bleeding noted.    Goal of Therapy:  Heparin level 0.3-0.7 units/ml Monitor platelets by anticoagulation protocol: Yes   Plan:  Continue heparin gtt 1200 units/hr Check a PM heparin level to confirm dosing Daily heparin level and CBC  Zyhir Cappella, Drake Leach 09/29/2018,8:57 AM

## 2018-09-29 NOTE — ED Notes (Signed)
[facility] 

## 2018-09-29 NOTE — Consult Note (Addendum)
Cardiology Consultation:   Patient ID: Bruce King MRN: [id number]; DOB: October 17, 1970  Admit date: 09/28/2018 Date of Consult: 09/29/2018  Primary Care Provider: Babs Sciara, MD Primary Cardiologist: Lance Muss, MD   Patient Profile:   Bruce King is a 48 y.o. male with a hx of CAD s/p DES to LAD x3, HTN, hyperlipidemia, anxiety and ongoing tobacco smoking who is being seen today for the evaluation of chest pain at the request of Dr. Benjamine Mola.  Cath 10/2016 for NSTEMI showed (details as below) 1. Severe one-vessel coronary artery disease with multiple areas of stenosis in the mid and distal LAD. The vessel is moderately calcified overall. 2. Normal LV systolic function and mildly elevated left ventricular end-diastolic pressure. 3. Successful angioplasty and 3 drug-eluting stent placements to the LAD. There is one distal LAD stent and 2 overlapped stents in the midsegment. Recommendations: Dual antiplatelet therapy for at least one year. Aggressive treatment of risk factors and smoking cessation. I started Aggrastat and will continue for 12 hours due to thrombus formation in the mid LAD after development of distal edge dissection. This was an overall difficult procedure due to diffuse disease in the LAD and moderate calcifications.  He was doing well on cardiac standpoint when last seen by Dr. Eldridge Dace June 2019.  History of Present Illness:   Mr. [name] presented for progressive worsening of shortness of breath and chest pressure.  He was in usual state of health up until about a week ago when starting to notice headache as well as exertional dyspnea and discomfort.  Which progressed and started to having intermittent dyspnea with chest pressure at rest.  He had a worse episode yesterday evening where he noted elevated blood pressure with worse episode of substernal chest pressure with shortness of breath.  He rated at 8 out of 10.  Radiated to jaw and upper back.  Similar to  prior angina but worsen.  His pain improved after sublingual nitroglycerin x3 in emergency room and eventually resolved on IV heparin and Nitropaste overnight.  Currently chest pain-free but continues to have mild headache.  Chest x-ray without acute findings.  Troponin negative x3.  Potassium 3.3 (supplemented).  Hemoglobin, serum creatinine and electrolytes otherwise normal.  EKG shows sinus rhythm at rate of 60 bpm with LVH criteria-personally reviewed.  Telemetry shows sinus rhythm at rate of 50s-personally reviewed.  Patient endorsed compliant with medication.  Social drinking.  He continues to smoke 1 pack a day.  No regular exercise.  He works at [employer] and has physical job.  No exertional limitation.  Past Medical History:  Diagnosis Date  . Anxiety   . Chest pain 09/2018  . Coronary artery disease   . Depression   . GAD (generalized anxiety disorder)   . GERD (gastroesophageal reflux disease)   . History of hiatal hernia   . Hyperlipidemia   . NSTEMI (non-ST elevated myocardial infarction) (HCC) 10/30/2016    Past Surgical History:  Procedure Laterality Date  . CORONARY ANGIOPLASTY WITH STENT PLACEMENT  10/31/2016   "3 stents"  . CORONARY STENT INTERVENTION N/A 10/31/2016   Procedure: Coronary Stent Intervention;  Surgeon: Iran Ouch, MD;  Location: [facility];  Service: Cardiovascular;  Laterality: N/A;  . FRACTURE SURGERY    . LEFT HEART CATH AND CORONARY ANGIOGRAPHY N/A 10/31/2016   Procedure: Left Heart Cath and Coronary Angiography;  Surgeon: Iran Ouch, MD;  Location: [facility];  Service: Cardiovascular;  Laterality: N/A;  .  ORIF TIBIA & FIBULA FRACTURES Right ~ 05/1996   "titanium rod"     Inpatient Medications: Scheduled Meds: . aspirin EC  81 mg Oral Daily  . atorvastatin  20 mg Oral QHS  . ezetimibe  10 mg Oral Daily  . losartan  50 mg Oral Daily  . metoprolol tartrate  50 mg Oral BID  . nicotine  21 mg Transdermal Daily  . sertraline   100 mg Oral Daily  . ticagrelor  90 mg Oral BID   Continuous Infusions: . heparin 1,200 Units/hr (09/29/18 0807)   PRN Meds: acetaminophen, ALPRAZolam, HYDROcodone-acetaminophen, morphine injection, nitroGLYCERIN, ondansetron (ZOFRAN) IV  Allergies:    Allergies  Allergen Reactions  . Bactrim [Sulfamethoxazole-Trimethoprim] Other (See Comments)    Felt Drunk  . Lipitor [Atorvastatin] Other (See Comments)    Stiffness in joints  . Pravastatin Other (See Comments)    stiffness    Social History:   Social History   Socioeconomic History  . Marital status: Divorced    Spouse name: Not on file  . Number of children: Not on file  . Years of education: Not on file  . Highest education level: Not on file  Occupational History  . Not on file  Social Needs  . Financial resource strain: Not on file  . Food insecurity:    Worry: Not on file    Inability: Not on file  . Transportation needs:    Medical: Not on file    Non-medical: Not on file  Tobacco Use  . Smoking status: Current Every Day Smoker    Packs/day: 1.00    Years: 25.00    Pack years: 25.00    Types: Cigarettes    Start date: 09/28/1989  . Smokeless tobacco: Former NeurosurgeonUser    Types: Chew, Snuff  Substance and Sexual Activity  . Alcohol use: Yes    Alcohol/week: 14.0 standard drinks    Types: 14 Cans of beer per week    Comment: 10/31/2016 "only drink on Fridays"  . Drug use: No  . Sexual activity: Yes  Lifestyle  . Physical activity:    Days per week: Not on file    Minutes per session: Not on file  . Stress: Not on file  Relationships  . Social connections:    Talks on phone: Not on file    Gets together: Not on file    Attends religious service: Not on file    Active member of club or organization: Not on file    Attends meetings of clubs or organizations: Not on file    Relationship status: Not on file  . Intimate partner violence:    Fear of current or ex partner: Not on file    Emotionally abused:  Not on file    Physically abused: Not on file    Forced sexual activity: Not on file  Other Topics Concern  . Not on file  Social History Narrative  . Not on file    Family History:   Family History  Problem Relation Age of Onset  . Hypertension Mother   . Heart attack Father      ROS:  Please see the history of present illness.  All other ROS reviewed and negative.     Physical Exam/Data:   Vitals:   09/29/18 0900 09/29/18 1016 09/29/18 1050 09/29/18 1050  BP: 135/82 (!) 145/93 136/81 136/81  Pulse: (!) 55 (!) 53 (!) 57 (!) 58  Resp: 11 13 18 18   Temp:  Marland Kitchen(!)  97.4 F (36.3 C) 97.6 F (36.4 C) 97.6 F (36.4 C)  TempSrc:  Oral Oral Oral  SpO2: 96% 99% 96% 96%  Weight:    103.5 kg    Intake/Output Summary (Last 24 hours) at 09/29/2018 1552 Last data filed at 09/29/2018 1300 Gross per 24 hour  Intake 360 ml  Output -  Net 360 ml   Filed Weights   09/29/18 1050  Weight: 103.5 kg   Body mass index is 30.09 kg/m.  General:  Well nourished, well developed, in no acute distress HEENT: normal Lymph: no adenopathy Neck: no JVD Endocrine:  No thryomegaly Vascular: No carotid bruits; FA pulses 2+ bilaterally without bruits  Cardiac:  normal S1, S2; RRR; no murmur  Lungs:  clear to auscultation bilaterally, no wheezing, rhonchi or rales  Abd: soft, nontender, no hepatomegaly  Ext: no edema Musculoskeletal:  No deformities, BUE and BLE strength normal and equal Skin: warm and dry  Neuro:  CNs 2-12 intact, no focal abnormalities noted Psych:  Normal affect   Relevant CV Studies:  LHC: 10/31/16    Mid RCA lesion, 20 %stenosed.  Ost RPDA lesion, 30 %stenosed.  2nd RPLB lesion, 40 %stenosed.  Ost 2nd Mrg to 2nd Mrg lesion, 30 %stenosed.  Ost 2nd Diag to 2nd Diag lesion, 30 %stenosed.  Ost 1st Sept lesion, 70 %stenosed.  Ost 1st Diag lesion, 80 %stenosed.  The left ventricular systolic function is normal.  LV end diastolic pressure is mildly  elevated.  The left ventricular ejection fraction is 50-55% by visual estimate.  A STENT RESOLUTE ONYX 2.25X12 drug eluting stent was successfully placed.  Dist LAD lesion, 70 %stenosed.  Post intervention, there is a 0% residual stenosis.  A STENT RESOLUTE ONYX 2.25X15 drug eluting stent was successfully placed, and overlaps previously placed stent.  Mid LAD to Dist LAD lesion, 70 %stenosed.  Post intervention, there is a 0% residual stenosis.  A STENT RESOLUTE ONYX 2.5X30 drug eluting stent was successfully placed.  Mid LAD lesion, 95 %stenosed.  Post intervention, there is a 0% residual stenosis.  1. Severe one-vessel coronary artery disease with multiple areas of stenosis in the mid and distal LAD. The vessel is moderately calcified overall. 2. Normal LV systolic function and mildly elevated left ventricular end-diastolic pressure. 3. Successful angioplasty and 3 drug-eluting stent placements to the LAD. There is one distal LAD stent and 2 overlapped stents in the midsegment.   Recommendations: Dual antiplatelet therapy for at least one year. Aggressive treatment of risk factors and smoking cessation. I started Aggrastat and will continue for 12 hours due to thrombus formation in the mid LAD after development of distal edge dissection. This was an overall difficult procedure due to diffuse disease in the LAD and moderate calcifications.  Diagnostic Diagram     Post-Intervention Diagram      Echo 11/01/16 Study Conclusions  - Left ventricle: The cavity size was normal. Wall thickness was   normal. Systolic function was normal. The estimated ejection   fraction was in the range of 55% to 60%. Wall motion was normal;   there were no regional wall motion abnormalities. There was an   increased relative contribution of atrial contraction to   ventricular filling. Doppler parameters are consistent with   abnormal left ventricular relaxation (grade 1 diastolic    dysfunction).  Laboratory Data:  Chemistry Recent Labs  Lab 09/28/18 2040  NA 136  K 3.3*  CL 103  CO2 25  GLUCOSE 102*  BUN 12  CREATININE 1.13  CALCIUM 9.5  GFRNONAA >60  GFRAA >60  ANIONGAP 8    No results for input(s): PROT, ALBUMIN, AST, ALT, ALKPHOS, BILITOT in the last 168 hours. Hematology Recent Labs  Lab 09/28/18 2040 09/29/18 0550  WBC 9.6 9.4  RBC 4.75 4.35  HGB 14.4 13.4  HCT 42.7 39.7  MCV 89.9 91.3  MCH 30.3 30.8  MCHC 33.7 33.8  RDW 13.2 13.2  PLT 305 299   Cardiac Enzymes Recent Labs  Lab 09/29/18 0024 09/29/18 0550 09/29/18 1113  TROPONINI <0.03 <0.03 <0.03    Recent Labs  Lab 09/28/18 2108  TROPIPOC 0.00    Radiology/Studies:  Dg Chest 2 View  Result Date: 09/28/2018 CLINICAL DATA:  Chest pain and shortness of breath. EXAM: CHEST - 2 VIEW COMPARISON:  10/30/2016 FINDINGS: The cardiomediastinal contours are normal. Mild linear scarring in the lingula. Pulmonary vasculature is normal. No consolidation, pleural effusion, or pneumothorax. No acute osseous abnormalities are seen. IMPRESSION: No acute chest findings. Electronically Signed   By: Narda RutherfordMelanie  Sanford M.D.   On: 09/28/2018 21:41   Assessment and Plan:   1. Unstable angina His symptoms is more intense than prior angina radiating to his jaw and upper back.  Improved with sublingual nitroglycerin x3 and eventually resolved now on Nitropaste and IV heparin.  EKG without acute ischemic changes.  Troponin negative x3. - The patient understands that risks include but are not limited to stroke (1 in 1000), death (1 in 1000), kidney failure [usually temporary] (1 in 500), bleeding (1 in 200), allergic reaction [possibly serious] (1 in 200), and agrees to proceed.   2.  CAD s/p LAD stent x3 -Extensive LAD disease which treated with 3 stenting in February 2018 as summarized above. -He is maintained on dual antiplatelet therapy with aspirin and Brilinta.  Endorsed compliance. -Continue aspirin  and beta-blocker.  3.  Hyperlipidemia - No results found for requested labs within last 8760 hours.  -We will check lipid panel.  History of intolerance to higher dose statin. -Tolerating Lipitor 20 mg and Zetia 10 mg daily.  4.  Tobacco smoking -Advised cessation.  Not interested in quit.  5.  Hypertension -Elevated on presentation.  Now stable on current medication.   For questions or updates, please contact [facility] Please consult [email or web address] for contact info under     Signed, Manson PasseyBhavinkumar Bhagat, PA  09/29/2018 3:52 PM   ---------------------------------------------------------------------------------------------   History and all data above reviewed.  Patient examined.  I agree with the findings as above.  Norton PastelFranklin D Altschuler is a pleasant 48 yo current smoker with previous NSTEMI 10/2016 with 3 stents to LAD and residual diagonal disease.   Constitutional: No acute distress Eyes: pupils equally round and reactive to light, sclera non-icteric, normal conjunctiva and lids ENMT: normal dentition, moist mucous membranes Cardiovascular: regular rhythm, normal rate, no murmurs. S1 and S2 normal. Radial pulses 4/4 bilaterally. No jugular venous distention.  Respiratory: clear to auscultation bilaterally GI : normal bowel sounds, soft and nontender. No distention.   MSK: extremities warm, well perfused. No edema.  NEURO: grossly nonfocal exam, moves all extremities. PSYCH: alert and oriented x 3, normal mood and affect.   All available labs, radiology testing, previous records reviewed. Agree with documented assessment and plan of my colleague as stated above with the following additions or changes:  Principal Problem:   Chest pain Active Problems:   Generalized anxiety disorder   Hyperlipidemia   Tobacco use   Status post coronary artery  stent placement   CAD (coronary artery disease)   Essential hypertension    Plan: plan for coronary angiography tomorrow for  unstable angina. It is unlikely that his disease has progressed significant however we will want to ensure stent patency. It is possible that he had an acute hypertensive episode as well and his residual diagonal disease created chest pain. Continue Brilinta and ASA. We discussed smoking cessation and exercise.  We will follow-up lipid panel. Patient has been consented for coronary angiography as noted above.  Length of Stay:  LOS: 0 days   Parke Poisson, MD [facility] 6:09 PM  09/29/2018

## 2018-09-29 NOTE — H&P (View-Only) (Signed)
Cardiology Consultation:   Patient ID: Bruce King MRN: [id number]; DOB: 08/23/1971  Admit date: 09/28/2018 Date of Consult: 09/29/2018  Primary Care Provider: Luking, Scott A, MD Primary Cardiologist: Jayadeep Varanasi, MD   Patient Profile:   Bruce King is a 48 y.o. male with a hx of CAD s/p DES to LAD x3, HTN, hyperlipidemia, anxiety and ongoing tobacco smoking who is being seen today for the evaluation of chest pain at the request of Dr. Vann.  Cath 10/2016 for NSTEMI showed (details as below) 1. Severe one-vessel coronary artery disease with multiple areas of stenosis in the mid and distal LAD. The vessel is moderately calcified overall. 2. Normal LV systolic function and mildly elevated left ventricular end-diastolic pressure. 3. Successful angioplasty and 3 drug-eluting stent placements to the LAD. There is one distal LAD stent and 2 overlapped stents in the midsegment. Recommendations: Dual antiplatelet therapy for at least one year. Aggressive treatment of risk factors and smoking cessation. I started Aggrastat and will continue for 12 hours due to thrombus formation in the mid LAD after development of distal edge dissection. This was an overall difficult procedure due to diffuse disease in the LAD and moderate calcifications.  He was doing well on cardiac standpoint when last seen by Dr. Varanasi June 2019.  History of Present Illness:   Mr. Delpriore presented for progressive worsening of shortness of breath and chest pressure.  He was in usual state of health up until about a week ago when starting to notice headache as well as exertional dyspnea and discomfort.  Which progressed and started to having intermittent dyspnea with chest pressure at rest.  He had a worse episode yesterday evening where he noted elevated blood pressure with worse episode of substernal chest pressure with shortness of breath.  He rated at 8 out of 10.  Radiated to jaw and upper back.  Similar to  prior angina but worsen.  His pain improved after sublingual nitroglycerin x3 in emergency room and eventually resolved on IV heparin and Nitropaste overnight.  Currently chest pain-free but continues to have mild headache.  Chest x-ray without acute findings.  Troponin negative x3.  Potassium 3.3 (supplemented).  Hemoglobin, serum creatinine and electrolytes otherwise normal.  EKG shows sinus rhythm at rate of 60 bpm with LVH criteria-personally reviewed.  Telemetry shows sinus rhythm at rate of 50s-personally reviewed.  Patient endorsed compliant with medication.  Social drinking.  He continues to smoke 1 pack a day.  No regular exercise.  He works at [employer] and has physical job.  No exertional limitation.  Past Medical History:  Diagnosis Date  . Anxiety   . Chest pain 09/2018  . Coronary artery disease   . Depression   . GAD (generalized anxiety disorder)   . GERD (gastroesophageal reflux disease)   . History of hiatal hernia   . Hyperlipidemia   . NSTEMI (non-ST elevated myocardial infarction) (HCC) 10/30/2016    Past Surgical History:  Procedure Laterality Date  . CORONARY ANGIOPLASTY WITH STENT PLACEMENT  10/31/2016   "3 stents"  . CORONARY STENT INTERVENTION N/A 10/31/2016   Procedure: Coronary Stent Intervention;  Surgeon: Muhammad A Arida, MD;  Location: [facility];  Service: Cardiovascular;  Laterality: N/A;  . FRACTURE SURGERY    . LEFT HEART CATH AND CORONARY ANGIOGRAPHY N/A 10/31/2016   Procedure: Left Heart Cath and Coronary Angiography;  Surgeon: Muhammad A Arida, MD;  Location: [facility];  Service: Cardiovascular;  Laterality: N/A;  .   ORIF TIBIA & FIBULA FRACTURES Right ~ 05/1996   "titanium rod"     Inpatient Medications: Scheduled Meds: . aspirin EC  81 mg Oral Daily  . atorvastatin  20 mg Oral QHS  . ezetimibe  10 mg Oral Daily  . losartan  50 mg Oral Daily  . metoprolol tartrate  50 mg Oral BID  . nicotine  21 mg Transdermal Daily  . sertraline   100 mg Oral Daily  . ticagrelor  90 mg Oral BID   Continuous Infusions: . heparin 1,200 Units/hr (09/29/18 0807)   PRN Meds: acetaminophen, ALPRAZolam, HYDROcodone-acetaminophen, morphine injection, nitroGLYCERIN, ondansetron (ZOFRAN) IV  Allergies:    Allergies  Allergen Reactions  . Bactrim [Sulfamethoxazole-Trimethoprim] Other (See Comments)    Felt Drunk  . Lipitor [Atorvastatin] Other (See Comments)    Stiffness in joints  . Pravastatin Other (See Comments)    stiffness    Social History:   Social History   Socioeconomic History  . Marital status: Divorced    Spouse name: Not on file  . Number of children: Not on file  . Years of education: Not on file  . Highest education level: Not on file  Occupational History  . Not on file  Social Needs  . Financial resource strain: Not on file  . Food insecurity:    Worry: Not on file    Inability: Not on file  . Transportation needs:    Medical: Not on file    Non-medical: Not on file  Tobacco Use  . Smoking status: Current Every Day Smoker    Packs/day: 1.00    Years: 25.00    Pack years: 25.00    Types: Cigarettes    Start date: 09/28/1989  . Smokeless tobacco: Former User    Types: Chew, Snuff  Substance and Sexual Activity  . Alcohol use: Yes    Alcohol/week: 14.0 standard drinks    Types: 14 Cans of beer per week    Comment: 10/31/2016 "only drink on Fridays"  . Drug use: No  . Sexual activity: Yes  Lifestyle  . Physical activity:    Days per week: Not on file    Minutes per session: Not on file  . Stress: Not on file  Relationships  . Social connections:    Talks on phone: Not on file    Gets together: Not on file    Attends religious service: Not on file    Active member of club or organization: Not on file    Attends meetings of clubs or organizations: Not on file    Relationship status: Not on file  . Intimate partner violence:    Fear of current or ex partner: Not on file    Emotionally abused:  Not on file    Physically abused: Not on file    Forced sexual activity: Not on file  Other Topics Concern  . Not on file  Social History Narrative  . Not on file    Family History:   Family History  Problem Relation Age of Onset  . Hypertension Mother   . Heart attack Father      ROS:  Please see the history of present illness.  All other ROS reviewed and negative.     Physical Exam/Data:   Vitals:   09/29/18 0900 09/29/18 1016 09/29/18 1050 09/29/18 1050  BP: 135/82 (!) 145/93 136/81 136/81  Pulse: (!) 55 (!) 53 (!) 57 (!) 58  Resp: 11 13 18 18  Temp:  (!)   97.4 F (36.3 C) 97.6 F (36.4 C) 97.6 F (36.4 C)  TempSrc:  Oral Oral Oral  SpO2: 96% 99% 96% 96%  Weight:    103.5 kg    Intake/Output Summary (Last 24 hours) at 09/29/2018 1552 Last data filed at 09/29/2018 1300 Gross per 24 hour  Intake 360 ml  Output -  Net 360 ml   Filed Weights   09/29/18 1050  Weight: 103.5 kg   Body mass index is 30.09 kg/m.  General:  Well nourished, well developed, in no acute distress HEENT: normal Lymph: no adenopathy Neck: no JVD Endocrine:  No thryomegaly Vascular: No carotid bruits; FA pulses 2+ bilaterally without bruits  Cardiac:  normal S1, S2; RRR; no murmur  Lungs:  clear to auscultation bilaterally, no wheezing, rhonchi or rales  Abd: soft, nontender, no hepatomegaly  Ext: no edema Musculoskeletal:  No deformities, BUE and BLE strength normal and equal Skin: warm and dry  Neuro:  CNs 2-12 intact, no focal abnormalities noted Psych:  Normal affect   Relevant CV Studies:  LHC: 10/31/16    Mid RCA lesion, 20 %stenosed.  Ost RPDA lesion, 30 %stenosed.  2nd RPLB lesion, 40 %stenosed.  Ost 2nd Mrg to 2nd Mrg lesion, 30 %stenosed.  Ost 2nd Diag to 2nd Diag lesion, 30 %stenosed.  Ost 1st Sept lesion, 70 %stenosed.  Ost 1st Diag lesion, 80 %stenosed.  The left ventricular systolic function is normal.  LV end diastolic pressure is mildly  elevated.  The left ventricular ejection fraction is 50-55% by visual estimate.  A STENT RESOLUTE ONYX 2.25X12 drug eluting stent was successfully placed.  Dist LAD lesion, 70 %stenosed.  Post intervention, there is a 0% residual stenosis.  A STENT RESOLUTE ONYX 2.25X15 drug eluting stent was successfully placed, and overlaps previously placed stent.  Mid LAD to Dist LAD lesion, 70 %stenosed.  Post intervention, there is a 0% residual stenosis.  A STENT RESOLUTE ONYX 2.5X30 drug eluting stent was successfully placed.  Mid LAD lesion, 95 %stenosed.  Post intervention, there is a 0% residual stenosis.  1. Severe one-vessel coronary artery disease with multiple areas of stenosis in the mid and distal LAD. The vessel is moderately calcified overall. 2. Normal LV systolic function and mildly elevated left ventricular end-diastolic pressure. 3. Successful angioplasty and 3 drug-eluting stent placements to the LAD. There is one distal LAD stent and 2 overlapped stents in the midsegment.   Recommendations: Dual antiplatelet therapy for at least one year. Aggressive treatment of risk factors and smoking cessation. I started Aggrastat and will continue for 12 hours due to thrombus formation in the mid LAD after development of distal edge dissection. This was an overall difficult procedure due to diffuse disease in the LAD and moderate calcifications.  Diagnostic Diagram     Post-Intervention Diagram      Echo 11/01/16 Study Conclusions  - Left ventricle: The cavity size was normal. Wall thickness was   normal. Systolic function was normal. The estimated ejection   fraction was in the range of 55% to 60%. Wall motion was normal;   there were no regional wall motion abnormalities. There was an   increased relative contribution of atrial contraction to   ventricular filling. Doppler parameters are consistent with   abnormal left ventricular relaxation (grade 1 diastolic    dysfunction).  Laboratory Data:  Chemistry Recent Labs  Lab 09/28/18 2040  NA 136  K 3.3*  CL 103  CO2 25  GLUCOSE 102*  BUN 12    CREATININE 1.13  CALCIUM 9.5  GFRNONAA >60  GFRAA >60  ANIONGAP 8    No results for input(s): PROT, ALBUMIN, AST, ALT, ALKPHOS, BILITOT in the last 168 hours. Hematology Recent Labs  Lab 09/28/18 2040 09/29/18 0550  WBC 9.6 9.4  RBC 4.75 4.35  HGB 14.4 13.4  HCT 42.7 39.7  MCV 89.9 91.3  MCH 30.3 30.8  MCHC 33.7 33.8  RDW 13.2 13.2  PLT 305 299   Cardiac Enzymes Recent Labs  Lab 09/29/18 0024 09/29/18 0550 09/29/18 1113  TROPONINI <0.03 <0.03 <0.03    Recent Labs  Lab 09/28/18 2108  TROPIPOC 0.00    Radiology/Studies:  Dg Chest 2 View  Result Date: 09/28/2018 CLINICAL DATA:  Chest pain and shortness of breath. EXAM: CHEST - 2 VIEW COMPARISON:  10/30/2016 FINDINGS: The cardiomediastinal contours are normal. Mild linear scarring in the lingula. Pulmonary vasculature is normal. No consolidation, pleural effusion, or pneumothorax. No acute osseous abnormalities are seen. IMPRESSION: No acute chest findings. Electronically Signed   By: Melanie  Sanford M.D.   On: 09/28/2018 21:41   Assessment and Plan:   1. Unstable angina His symptoms is more intense than prior angina radiating to his jaw and upper back.  Improved with sublingual nitroglycerin x3 and eventually resolved now on Nitropaste and IV heparin.  EKG without acute ischemic changes.  Troponin negative x3. - The patient understands that risks include but are not limited to stroke (1 in 1000), death (1 in 1000), kidney failure [usually temporary] (1 in 500), bleeding (1 in 200), allergic reaction [possibly serious] (1 in 200), and agrees to proceed.   2.  CAD s/p LAD stent x3 -Extensive LAD disease which treated with 3 stenting in February 2018 as summarized above. -He is maintained on dual antiplatelet therapy with aspirin and Brilinta.  Endorsed compliance. -Continue aspirin  and beta-blocker.  3.  Hyperlipidemia - No results found for requested labs within last 8760 hours.  -We will check lipid panel.  History of intolerance to higher dose statin. -Tolerating Lipitor 20 mg and Zetia 10 mg daily.  4.  Tobacco smoking -Advised cessation.  Not interested in quit.  5.  Hypertension -Elevated on presentation.  Now stable on current medication.   For questions or updates, please contact [facility] Please consult [email or web address] for contact info under     Signed, Bhavinkumar Bhagat, PA  09/29/2018 3:52 PM   ---------------------------------------------------------------------------------------------   History and all data above reviewed.  Patient examined.  I agree with the findings as above.  Saveon D Berhane is a pleasant 47 yo current smoker with previous NSTEMI 10/2016 with 3 stents to LAD and residual diagonal disease.   Constitutional: No acute distress Eyes: pupils equally round and reactive to light, sclera non-icteric, normal conjunctiva and lids ENMT: normal dentition, moist mucous membranes Cardiovascular: regular rhythm, normal rate, no murmurs. S1 and S2 normal. Radial pulses 4/4 bilaterally. No jugular venous distention.  Respiratory: clear to auscultation bilaterally GI : normal bowel sounds, soft and nontender. No distention.   MSK: extremities warm, well perfused. No edema.  NEURO: grossly nonfocal exam, moves all extremities. PSYCH: alert and oriented x 3, normal mood and affect.   All available labs, radiology testing, previous records reviewed. Agree with documented assessment and plan of my colleague as stated above with the following additions or changes:  Principal Problem:   Chest pain Active Problems:   Generalized anxiety disorder   Hyperlipidemia   Tobacco use   Status post coronary artery   stent placement   CAD (coronary artery disease)   Essential hypertension    Plan: plan for coronary angiography tomorrow for  unstable angina. It is unlikely that his disease has progressed significant however we will want to ensure stent patency. It is possible that he had an acute hypertensive episode as well and his residual diagonal disease created chest pain. Continue Brilinta and ASA. We discussed smoking cessation and exercise.  We will follow-up lipid panel. Patient has been consented for coronary angiography as noted above.  Length of Stay:  LOS: 0 days   Janaki Exley A Issiac Jamar, MD [facility] 6:09 PM  09/29/2018   

## 2018-09-29 NOTE — ED Notes (Signed)
Breakfast tray ordered 

## 2018-09-29 NOTE — Progress Notes (Signed)
ANTICOAGULATION CONSULT NOTE  Pharmacy Consult for Heparin  Indication: chest pain/ACS  Vital Signs: Temp: 97.6 F (36.4 C) (01/06 1050) Temp Source: Oral (01/06 1050) BP: 148/88 (01/06 1600) Pulse Rate: 58 (01/06 1050)  Labs: Recent Labs    09/28/18 2040 09/29/18 0024 09/29/18 0550 09/29/18 0809 09/29/18 1113 09/29/18 1554  HGB 14.4  --  13.4  --   --   --   HCT 42.7  --  39.7  --   --   --   PLT 305  --  299  --   --   --   HEPARINUNFRC  --   --   --  0.65  --  0.27*  CREATININE 1.13  --   --   --   --   --   TROPONINI  --  <0.03 <0.03  --  <0.03  --     Estimated Creatinine Clearance: 103.2 mL/min (by C-G formula based on SCr of 1.13 mg/dL).  Assessment: 48 y/o M to begin IV heparin for CP. No anticoagulation PTA.  Initial heparin level is therapeutic. Confirmatory heparin level came subtherapeutic at 0.27, on 1200 units/hr. Hgb 13.4, plt 299. No s/sx of bleeding. No infusion issues per nursing.   Goal of Therapy:  Heparin level 0.3-0.7 units/ml Monitor platelets by anticoagulation protocol: Yes   Plan:  Increase heparin gtt to 1350 units/hr Daily heparin level and CBC  Sherron Monday, PharmD, BCCCP Clinical Pharmacist  Pager: [phone] Phone: [phone] 09/29/2018,5:44 PM

## 2018-09-29 NOTE — ED Notes (Signed)
Pt transferred onto inpatient stretcher for comfort, pt ambulated to BR without difficulty or increase in CP. Pt medicated with night medication per MD order.

## 2018-09-29 NOTE — Progress Notes (Signed)
Progress Note    Bruce King  NWG:[id number] DOB: 1971-03-18  DOA: 09/28/2018 PCP: Babs Sciara, MD    Brief Narrative:    Medical records reviewed and are as summarized below:  Bruce King is an 48 y.o. male  with medical history significant of coronary artery disease status post previous stenting, GERD, depression with anxiety, hypertension, hyperlipidemia who presents to the ER with central chest pain rated as 7 out of 10.  He has been having on and off for about a week.  Associated with elevated blood pressure and headache.  This is getting worse today.  The pain radiates to his left jaw.  Not relieved by his home nitroglycerin.  Patient continues to smoke cigarettes despite his previous MI.  Patient reports this is exactly how he presented 2 years ago and got 3 stents.  (POC troponins trended up during that visit)  Assessment/Plan:   Principal Problem:   Chest pain Active Problems:   Generalized anxiety disorder   Hyperlipidemia   Tobacco use   Status post coronary artery stent placement   CAD (coronary artery disease)   Essential hypertension  chest pain:  -CE negative and thus re-assuring but patient describes this episode as being the exact same as prior when he got 3 stents -has not missed any doses of medications (on brilenta) -placed on IV heparin in ER -cardiology consult -no calf tenderness/swelling- so doubt PE  tobacco abuse:  -encourage cessation  Headache -d/c nitro patch -PRN pain meds  hyperlipidemia:  -continue with statin. FLP in AM  Hypertension: -still elevated despite home regimen -denies dietary indiscretions  Hypokalemia:  replete  obesity Body mass index is 30.09 kg/m.   Family Communication/Anticipated D/C date and plan/Code Status   DVT prophylaxis: heparin gtt Code Status: Full Code.  Family Communication: none at bedside Disposition Plan: pending cardiology recommendations   Medical Consultants:     cards     Subjective:   Having headache   Objective:    Vitals:   09/29/18 0900 09/29/18 1016 09/29/18 1050 09/29/18 1050  BP: 135/82 (!) 145/93 136/81 136/81  Pulse: (!) 55 (!) 53 (!) 57 (!) 58  Resp: 11 13 18 18   Temp:  (!) 97.4 F (36.3 C) 97.6 F (36.4 C) 97.6 F (36.4 C)  TempSrc:  Oral Oral Oral  SpO2: 96% 99% 96% 96%  Weight:    103.5 kg    Intake/Output Summary (Last 24 hours) at 09/29/2018 1500 Last data filed at 09/29/2018 1300 Gross per 24 hour  Intake 360 ml  Output -  Net 360 ml   Filed Weights   09/29/18 1050  Weight: 103.5 kg    Exam: In bed, appears comfortable A+Ox3 Pleasant and cooperative rrr No wheezing No LE edema   Data Reviewed:   I have personally reviewed following labs and imaging studies:  Labs: Labs show the following:   Basic Metabolic Panel: Recent Labs  Lab 09/28/18 2040  NA 136  K 3.3*  CL 103  CO2 25  GLUCOSE 102*  BUN 12  CREATININE 1.13  CALCIUM 9.5   GFR Estimated Creatinine Clearance: 103.2 mL/min (by C-G formula based on SCr of 1.13 mg/dL). Liver Function Tests: No results for input(s): AST, ALT, ALKPHOS, BILITOT, PROT, ALBUMIN in the last 168 hours. No results for input(s): LIPASE, AMYLASE in the last 168 hours. No results for input(s): AMMONIA in the last 168 hours. Coagulation profile No results for input(s): INR, PROTIME in the last  168 hours.  CBC: Recent Labs  Lab 09/28/18 2040 09/29/18 0550  WBC 9.6 9.4  HGB 14.4 13.4  HCT 42.7 39.7  MCV 89.9 91.3  PLT 305 299   Cardiac Enzymes: Recent Labs  Lab 09/29/18 0024 09/29/18 0550 09/29/18 1113  TROPONINI <0.03 <0.03 <0.03   BNP (last 3 results) No results for input(s): PROBNP in the last 8760 hours. CBG: No results for input(s): GLUCAP in the last 168 hours. D-Dimer: No results for input(s): DDIMER in the last 72 hours. Hgb A1c: No results for input(s): HGBA1C in the last 72 hours. Lipid Profile: No results for input(s):  CHOL, HDL, LDLCALC, TRIG, CHOLHDL, LDLDIRECT in the last 72 hours. Thyroid function studies: No results for input(s): TSH, T4TOTAL, T3FREE, THYROIDAB in the last 72 hours.  Invalid input(s): FREET3 Anemia work up: No results for input(s): VITAMINB12, FOLATE, FERRITIN, TIBC, IRON, RETICCTPCT in the last 72 hours. Sepsis Labs: Recent Labs  Lab 09/28/18 2040 09/29/18 0550  WBC 9.6 9.4    Microbiology No results found for this or any previous visit (from the past 240 hour(s)).  Procedures and diagnostic studies:  Dg Chest 2 View  Result Date: 09/28/2018 CLINICAL DATA:  Chest pain and shortness of breath. EXAM: CHEST - 2 VIEW COMPARISON:  10/30/2016 FINDINGS: The cardiomediastinal contours are normal. Mild linear scarring in the lingula. Pulmonary vasculature is normal. No consolidation, pleural effusion, or pneumothorax. No acute osseous abnormalities are seen. IMPRESSION: No acute chest findings. Electronically Signed   By: Narda Rutherford M.D.   On: 09/28/2018 21:41    Medications:   . aspirin EC  81 mg Oral Daily  . atorvastatin  20 mg Oral QHS  . ezetimibe  10 mg Oral Daily  . losartan  50 mg Oral Daily  . metoprolol tartrate  50 mg Oral BID  . nicotine  21 mg Transdermal Daily  . sertraline  100 mg Oral Daily  . ticagrelor  90 mg Oral BID   Continuous Infusions: . heparin 1,200 Units/hr (09/29/18 0807)     LOS: 0 days   Joseph Art  [facility]   *Please refer to [email or web address], password [email or web address] to get updated schedule on who will round on this patient, as hospitalists switch teams weekly. If 7PM-7AM, please contact night-coverage at [email or web address], password [email or web address] for any overnight needs.  09/29/2018, 3:00 PM

## 2018-09-30 ENCOUNTER — Encounter (HOSPITAL_COMMUNITY)
Admission: EM | Disposition: A | Discharge status: Home / Self Care | Payer: Self-pay | Source: Home / Self Care | Attending: Emergency Medicine

## 2018-09-30 ENCOUNTER — Encounter (HOSPITAL_COMMUNITY): Payer: Self-pay | Admitting: Cardiology

## 2018-09-30 DIAGNOSIS — I10 Essential (primary) hypertension: Secondary | ICD-10-CM

## 2018-09-30 DIAGNOSIS — I25118 Atherosclerotic heart disease of native coronary artery with other forms of angina pectoris: Secondary | ICD-10-CM

## 2018-09-30 DIAGNOSIS — I25110 Atherosclerotic heart disease of native coronary artery with unstable angina pectoris: Secondary | ICD-10-CM

## 2018-09-30 DIAGNOSIS — I200 Unstable angina: Secondary | ICD-10-CM

## 2018-09-30 DIAGNOSIS — E782 Mixed hyperlipidemia: Secondary | ICD-10-CM

## 2018-09-30 HISTORY — PX: LEFT HEART CATH AND CORONARY ANGIOGRAPHY: CATH118249

## 2018-09-30 HISTORY — PX: INTRAVASCULAR PRESSURE WIRE/FFR STUDY: CATH118243

## 2018-09-30 LAB — BASIC METABOLIC PANEL
Anion gap: 8 (ref 5–15)
BUN: 14 mg/dL (ref 6–20)
CALCIUM: 8.6 mg/dL — AB (ref 8.9–10.3)
CO2: 24 mmol/L (ref 22–32)
CREATININE: 1.13 mg/dL (ref 0.61–1.24)
Chloride: 105 mmol/L (ref 98–111)
GFR calc Af Amer: >60 mL/min (ref >60)
GFR calc non Af Amer: >60 mL/min (ref >60)
Glucose, Bld: 104 mg/dL — ABNORMAL HIGH (ref 70–99)
Potassium: 3.8 mmol/L (ref 3.5–5.1)
Sodium: 137 mmol/L (ref 135–145)

## 2018-09-30 LAB — CBC
HCT: 41.4 % (ref 39.0–52.0)
Hemoglobin: 13.8 g/dL (ref 13.0–17.0)
MCH: 30.1 pg (ref 26.0–34.0)
MCHC: 33.3 g/dL (ref 30.0–36.0)
MCV: 90.2 fL (ref 80.0–100.0)
Platelets: 285 10*3/uL (ref 150–400)
RBC: 4.59 MIL/uL (ref 4.22–5.81)
RDW: 13.2 % (ref 11.5–15.5)
WBC: 9.1 10*3/uL (ref 4.0–10.5)
nRBC: 0 % (ref 0.0–0.2)

## 2018-09-30 LAB — LIPID PANEL
CHOL/HDL RATIO: 4.6 ratio
Cholesterol: 106 mg/dL (ref 0–200)
HDL: 23 mg/dL — AB (ref >40)
LDL Cholesterol: 44 mg/dL (ref 0–99)
Triglycerides: 193 mg/dL — ABNORMAL HIGH (ref <150)
VLDL: 39 mg/dL (ref 0–40)

## 2018-09-30 LAB — POCT ACTIVATED CLOTTING TIME
ACTIVATED CLOTTING TIME: 274 s
Activated Clotting Time: 268 seconds

## 2018-09-30 LAB — HEPARIN LEVEL (UNFRACTIONATED): Heparin Unfractionated: 0.48 IU/mL (ref 0.30–0.70)

## 2018-09-30 SURGERY — LEFT HEART CATH AND CORONARY ANGIOGRAPHY
Anesthesia: LOCAL

## 2018-09-30 MED ORDER — HYDRALAZINE HCL 20 MG/ML IJ SOLN: 5.0000 mg | INTRAMUSCULAR | Status: DC | PRN

## 2018-09-30 MED ORDER — LIDOCAINE HCL (PF) 1 % IJ SOLN
INTRAMUSCULAR | Status: DC | PRN
  Administered 2018-09-30: 2 mL

## 2018-09-30 MED ORDER — SODIUM CHLORIDE 0.9% FLUSH: 3.0000 mL | Freq: Two times a day (BID) | INTRAVENOUS | Status: DC

## 2018-09-30 MED ORDER — SODIUM CHLORIDE 0.9 % IV SOLN: INTRAVENOUS | Status: AC

## 2018-09-30 MED ORDER — SODIUM CHLORIDE 0.9 % IV SOLN: 250.0000 mL | INTRAVENOUS | Status: DC | PRN

## 2018-09-30 MED ORDER — HEPARIN (PORCINE) IN NACL 1000-0.9 UT/500ML-% IV SOLN
INTRAVENOUS | Status: AC
  Filled 2018-09-30: qty 500

## 2018-09-30 MED ORDER — MIDAZOLAM HCL 2 MG/2ML IJ SOLN
INTRAMUSCULAR | Status: AC
  Filled 2018-09-30: qty 2

## 2018-09-30 MED ORDER — HEPARIN (PORCINE) IN NACL 1000-0.9 UT/500ML-% IV SOLN
INTRAVENOUS | Status: DC | PRN
  Administered 2018-09-30 (×2): 500 mL

## 2018-09-30 MED ORDER — VERAPAMIL HCL 2.5 MG/ML IV SOLN
INTRAVENOUS | Status: DC | PRN
  Administered 2018-09-30: 10 mL via INTRA_ARTERIAL

## 2018-09-30 MED ORDER — HEPARIN (PORCINE) IN NACL 1000-0.9 UT/500ML-% IV SOLN
INTRAVENOUS | Status: AC
  Filled 2018-09-30: qty 1000

## 2018-09-30 MED ORDER — ADENOSINE (DIAGNOSTIC) 140MCG/KG/MIN
INTRAVENOUS | Status: DC | PRN
  Administered 2018-09-30: 140 ug/kg/min via INTRAVENOUS

## 2018-09-30 MED ORDER — ONDANSETRON HCL 4 MG/2ML IJ SOLN
INTRAMUSCULAR | Status: DC | PRN
  Administered 2018-09-30: 4 mg via INTRAVENOUS

## 2018-09-30 MED ORDER — ADENOSINE 12 MG/4ML IV SOLN
INTRAVENOUS | Status: AC
  Filled 2018-09-30: qty 16

## 2018-09-30 MED ORDER — LIDOCAINE HCL (PF) 1 % IJ SOLN
INTRAMUSCULAR | Status: AC
  Filled 2018-09-30: qty 30

## 2018-09-30 MED ORDER — HEPARIN SODIUM (PORCINE) 1000 UNIT/ML IJ SOLN
INTRAMUSCULAR | Status: AC
  Filled 2018-09-30: qty 1

## 2018-09-30 MED ORDER — LABETALOL HCL 5 MG/ML IV SOLN: 10.0000 mg | INTRAVENOUS | Status: DC | PRN

## 2018-09-30 MED ORDER — HEPARIN SODIUM (PORCINE) 1000 UNIT/ML IJ SOLN
INTRAMUSCULAR | Status: DC | PRN
  Administered 2018-09-30 (×2): 5000 [IU] via INTRAVENOUS
  Administered 2018-09-30: 2000 [IU] via INTRAVENOUS

## 2018-09-30 MED ORDER — ONDANSETRON HCL 4 MG/2ML IJ SOLN
INTRAMUSCULAR | Status: AC
  Filled 2018-09-30: qty 2

## 2018-09-30 MED ORDER — IOHEXOL 350 MG/ML SOLN
INTRAVENOUS | Status: DC | PRN
  Administered 2018-09-30: 90 mL via INTRA_ARTERIAL

## 2018-09-30 MED ORDER — FENTANYL CITRATE (PF) 100 MCG/2ML IJ SOLN
INTRAMUSCULAR | Status: DC | PRN
  Administered 2018-09-30 (×3): 25 ug via INTRAVENOUS

## 2018-09-30 MED ORDER — VERAPAMIL HCL 2.5 MG/ML IV SOLN
INTRAVENOUS | Status: AC
  Filled 2018-09-30: qty 2

## 2018-09-30 MED ORDER — FENTANYL CITRATE (PF) 100 MCG/2ML IJ SOLN
INTRAMUSCULAR | Status: AC
  Filled 2018-09-30: qty 2

## 2018-09-30 MED ORDER — SODIUM CHLORIDE 0.9% FLUSH: 3.0000 mL | INTRAVENOUS | Status: DC | PRN

## 2018-09-30 MED ORDER — MIDAZOLAM HCL 2 MG/2ML IJ SOLN
INTRAMUSCULAR | Status: DC | PRN
  Administered 2018-09-30 (×2): 1 mg via INTRAVENOUS
  Administered 2018-09-30: 2 mg via INTRAVENOUS

## 2018-09-30 SURGICAL SUPPLY — 14 items
CATH OPTITORQUE TIG 4.0 5F (CATHETERS) ×1 IMPLANT
CATH VISTA GUIDE 6FR JL3.5 (CATHETERS) ×1 IMPLANT
CATH VISTA GUIDE 6FR XBLAD3.5 (CATHETERS) ×1 IMPLANT
DEVICE RAD COMP TR BAND LRG (VASCULAR PRODUCTS) ×1 IMPLANT
GLIDESHEATH SLEND SS 6F .021 (SHEATH) ×1 IMPLANT
GUIDEWIRE INQWIRE 1.5J.035X260 (WIRE) IMPLANT
GUIDEWIRE PRESSURE COMET II (WIRE) ×1 IMPLANT
INQWIRE 1.5J .035X260CM (WIRE) ×2
KIT ESSENTIALS PG (KITS) ×1 IMPLANT
KIT HEART LEFT (KITS) ×2 IMPLANT
PACK CARDIAC CATHETERIZATION (CUSTOM PROCEDURE TRAY) ×2 IMPLANT
SHEATH PROBE COVER 6X72 (BAG) ×1 IMPLANT
TRANSDUCER W/STOPCOCK (MISCELLANEOUS) ×2 IMPLANT
TUBING CIL FLEX 10 FLL-RA (TUBING) ×2 IMPLANT

## 2018-09-30 NOTE — Progress Notes (Addendum)
Progress Note  Patient Name: Norton PastelFranklin D Beitler Date of Encounter: 09/30/2018  Primary Cardiologist: Lance MussJayadeep Varanasi, MD   Subjective   No current chest pain or shortness of breath. Patient about to go down for left cardiac catheterization.   Inpatient Medications    Scheduled Meds: . aspirin EC  81 mg Oral Daily  . atorvastatin  20 mg Oral QHS  . ezetimibe  10 mg Oral Daily  . losartan  50 mg Oral Daily  . metoprolol tartrate  50 mg Oral BID  . nicotine  21 mg Transdermal Daily  . sertraline  100 mg Oral Daily  . sodium chloride flush  3 mL Intravenous Q12H  . ticagrelor  90 mg Oral BID   Continuous Infusions: . sodium chloride    . sodium chloride    . heparin 1,350 Units/hr (09/29/18 1805)   PRN Meds: sodium chloride, acetaminophen, ALPRAZolam, HYDROcodone-acetaminophen, morphine injection, nitroGLYCERIN, ondansetron (ZOFRAN) IV, sodium chloride flush, zolpidem   Vital Signs    Vitals:   09/29/18 2118 09/30/18 0609 09/30/18 0700 09/30/18 0912  BP: 130/78 (!) 144/92 134/86 134/86  Pulse: (!) 53 (!) 59 (!) 58 61  Resp:      Temp: 98.1 F (36.7 C) 97.8 F (36.6 C) (!) 97.5 F (36.4 C)   TempSrc: Oral Oral Oral   SpO2: 97% 98% 98%   Weight:  102 kg    Height:        Intake/Output Summary (Last 24 hours) at 09/30/2018 0951 Last data filed at 09/30/2018 0934 Gross per 24 hour  Intake 720 ml  Output -  Net 720 ml   Filed Weights   09/29/18 1050 09/30/18 0609  Weight: 103.5 kg 102 kg    Telemetry    Sinus rhythm with heart rates in the 50's to 70's. - Personally Reviewed  ECG    Sinus bradycardia, rate 56 bpm, with no acute changes. - Personally Reviewed  Physical Exam   GEN: Caucasian male resting comfortably. Alert and in no acute distress.   Neck: Supple. Cardiac: Mildly bradycardic with regular rhythm. No significant murmurs, rubs, or gallops.  Respiratory: Clear to auscultation bilaterally. No wheezes, rhonchi, or rales.  GI: Abdomen soft,  non-tender, non-distended  MS: No lower extremity edema. Skin: Warm and dry. Neuro:  No focal deficits. Psych: Normal affect.  Labs    Chemistry Recent Labs  Lab 09/28/18 2040 09/30/18 0413  NA 136 137  K 3.3* 3.8  CL 103 105  CO2 25 24  GLUCOSE 102* 104*  BUN 12 14  CREATININE 1.13 1.13  CALCIUM 9.5 8.6*  GFRNONAA >60 >60  GFRAA >60 >60  ANIONGAP 8 8     Hematology Recent Labs  Lab 09/28/18 2040 09/29/18 0550 09/30/18 0413  WBC 9.6 9.4 9.1  RBC 4.75 4.35 4.59  HGB 14.4 13.4 13.8  HCT 42.7 39.7 41.4  MCV 89.9 91.3 90.2  MCH 30.3 30.8 30.1  MCHC 33.7 33.8 33.3  RDW 13.2 13.2 13.2  PLT 305 299 285    Cardiac Enzymes Recent Labs  Lab 09/29/18 0024 09/29/18 0550 09/29/18 1113  TROPONINI <0.03 <0.03 <0.03    Recent Labs  Lab 09/28/18 2108  TROPIPOC 0.00     BNPNo results for input(s): BNP, PROBNP in the last 168 hours.   DDimer No results for input(s): DDIMER in the last 168 hours.   Radiology    Dg Chest 2 View  Result Date: 09/28/2018 CLINICAL DATA:  Chest pain and shortness of  breath. EXAM: CHEST - 2 VIEW COMPARISON:  10/30/2016 FINDINGS: The cardiomediastinal contours are normal. Mild linear scarring in the lingula. Pulmonary vasculature is normal. No consolidation, pleural effusion, or pneumothorax. No acute osseous abnormalities are seen. IMPRESSION: No acute chest findings. Electronically Signed   By: Narda Rutherford M.D.   On: 09/28/2018 21:41    Cardiac Studies   Echocardiogram 11/01/2016: Study Conclusions: - Left ventricle: The cavity size was normal. Wall thickness was   normal. Systolic function was normal. The estimated ejection   fraction was in the range of 55% to 60%. Wall motion was normal;   there were no regional wall motion abnormalities. There was an   increased relative contribution of atrial contraction to   ventricular filling. Doppler parameters are consistent with   abnormal left ventricular relaxation (grade 1  diastolic   dysfunction). _______________  Left Heart Catheterization 10/31/2016:  Mid RCA lesion, 20 %stenosed.  Ost RPDA lesion, 30 %stenosed.  2nd RPLB lesion, 40 %stenosed.  Ost 2nd Mrg to 2nd Mrg lesion, 30 %stenosed.  Ost 2nd Diag to 2nd Diag lesion, 30 %stenosed.  Ost 1st Sept lesion, 70 %stenosed.  Ost 1st Diag lesion, 80 %stenosed.  The left ventricular systolic function is normal.  LV end diastolic pressure is mildly elevated.  The left ventricular ejection fraction is 50-55% by visual estimate.  A STENT RESOLUTE ONYX 2.25X12 drug eluting stent was successfully placed.  Dist LAD lesion, 70 %stenosed.  Post intervention, there is a 0% residual stenosis.  A STENT RESOLUTE ONYX 2.25X15 drug eluting stent was successfully placed, and overlaps previously placed stent.  Mid LAD to Dist LAD lesion, 70 %stenosed.  Post intervention, there is a 0% residual stenosis.  A STENT RESOLUTE ONYX 2.5X30 drug eluting stent was successfully placed.  Mid LAD lesion, 95 %stenosed.  Post intervention, there is a 0% residual stenosis.   1. Severe one-vessel coronary artery disease with multiple areas of stenosis in the mid and distal LAD. The vessel is moderately calcified overall. 2. Normal LV systolic function and mildly elevated left ventricular end-diastolic pressure. 3. Successful angioplasty and 3 drug-eluting stent placements to the LAD. There is one distal LAD stent and 2 overlapped stents in the midsegment.  Recommendations: Dual antiplatelet therapy for at least one year. Aggressive treatment of risk factors and smoking cessation. I started Aggrastat and will continue for 12 hours due to thrombus formation in the mid LAD after development of distal edge dissection. This was an overall difficult procedure due to diffuse disease in the LAD and moderate calcifications.  Patient Profile   Mr. Bridwell is a 48 y.o. male with a history of CAD s/p DES to LAD x3, hypertension,  hyperlipidemia, anxiety, and ongoing tobacco use who presented to the [facility] ED on 1/5/202020 for evaluation of chest pain. Cardiology consulted for unstable angina.   Assessment & Plan    Unstable Angina with Known CAD - Repeat EKG this morning showed sinus bradycardia, rate 56 bpm, with no acute changes.  - Troponin negative x3.  - Continue dual antiplatelet therapy with Aspirin and Brilinta. - Continue statin, Zetia, and beta-blocker. - Patient is scheduled for left heart catheterization later today.   Hypertension - Most recent BP 134/86. - Continue current medications.  Hyperlipidemia - Lipid panel this admission: Cholesterol 106, Triglycerides 193, HDL 23, LDL 44. - At LDL goal of <70 given CAD. - Patient has a history of intolerance to higher dose statin. - Continue home Lipitor 20mg  daily.  and  Zetia 10mg  daily.  Tobacco Abuse - Complete cessation is advised. - Patient not interested in quitting at this time.    For questions or updates, please contact [facility] Please consult [email or web address] for contact info under        Signed, Corrin Parker, PA-C  09/30/2018, 9:51 AM    ---------------------------------------------------------------------------------------------   History and all data above reviewed.  Patient examined.  I agree with the findings as above.  REPHAEL BRINCEFIELD is anticipating cath today.   Constitutional: No acute distress Cardiovascular: regular rhythm, normal rate, no murmurs. S1 and S2 normal. Radial pulses normal bilaterally. No jugular venous distention.  Respiratory: clear to auscultation bilaterally GI : normal bowel sounds, soft and nontender. No distention.   MSK: extremities warm, well perfused. No edema.  NEURO: grossly nonfocal exam, moves all extremities. PSYCH: alert and oriented x 3, normal mood and affect.   All available labs, radiology testing, previous records reviewed. Agree with documented assessment and plan of  my colleague as stated above with the following additions or changes:  Principal Problem:   Chest pain Active Problems:   Generalized anxiety disorder   Hyperlipidemia   Tobacco use   Status post coronary artery stent placement   CAD (coronary artery disease)   Essential hypertension   Unstable angina (HCC)    Plan: troponins negative, plan for coronary angiogram today for unstable angina with known CAD.   Parke Poisson, MD [facility]

## 2018-09-30 NOTE — Progress Notes (Signed)
[id number] Gave pt smoking cessation handout and encouraged cessation. Discussed heart healthy food choices and diet given. Encouraged walking for ex. Reviewed NTG use. Did not discuss CRP 2 as pt negative for stent or MI.  Pt seen by me 10/2016. Pt voiced understanding of ed. Luetta Nutting RN BSN 09/30/2018 2:32 PM

## 2018-09-30 NOTE — Progress Notes (Signed)
Patient received discharge information and acknowledged understanding of it. Patient ambulated with RN in hall and denied any complaints. Patient's radial site was clean, dry, and intact. Slight bruising but no signs of hematoma/bleeding. Patient IVs were removed.

## 2018-09-30 NOTE — Interval H&P Note (Signed)
History and Physical Interval Note:  09/30/2018 10:11 AM  Bruce King  has presented today for surgery, with the diagnosis of Unstable Angina.  The various methods of treatment have been discussed with the patient and family. After consideration of risks, benefits and other options for treatment, the patient has consented to  Procedure(s): LEFT HEART CATH AND CORONARY ANGIOGRAPHY (N/A)  With possible PERCUTANEOUS CORONARY INTERVENTION as a surgical intervention .  The patient's history has been reviewed, patient examined, no change in status, stable for surgery.  I have reviewed the patient's chart and labs.  Questions were answered to the patient's satisfaction.      Cath Lab Visit (complete for each Cath Lab visit)  Clinical Evaluation Leading to the Procedure:   ACS: Yes.    Non-ACS:    Anginal Classification: CCS IV   Anti-ischemic medical therapy: Minimal Therapy (1 class of medications)  Non-Invasive Test Results: No non-invasive testing performed  Prior CABG: No previous CABG    Bryan Lemma

## 2018-09-30 NOTE — Progress Notes (Signed)
Progress Note    Bruce King  QZR:[id number] DOB: [birth year]  DOA: 09/28/2018 PCP: Babs Sciara, MD    Brief Narrative:    Medical records reviewed and are as summarized below:  Bruce King is an 48 y.o. male  with medical history significant of coronary artery disease status post previous stenting, GERD, depression with anxiety, hypertension, hyperlipidemia who presents to the ER with central chest pain rated as 7 out of 10.  He has been having on and off for about a week.  Associated with elevated blood pressure and headache.  This is getting worse today.  The pain radiates to his left jaw.  Not relieved by his home nitroglycerin.  Patient continues to smoke cigarettes despite his previous MI.  Patient reports this is exactly how he presented 2 years ago and got 3 stents.  (POC troponins trended up during that visit).  Cath today.  Assessment/Plan:   Principal Problem:   Chest pain Active Problems:   Generalized anxiety disorder   Hyperlipidemia   Tobacco use   Status post coronary artery stent placement   CAD (coronary artery disease)   Essential hypertension  chest pain:  -CE negative and thus re-assuring but patient describes this episode as being the exact same as prior when he got 3 stents -has not missed any doses of medications (on brilenta) -placed on IV heparin in ER -cardiology consult appreciated- plan for cath today -no calf tenderness/swelling- so doubt PE  tobacco abuse:  -encourage cessation  Headache -d/c nitro patch -PRN pain meds  hyperlipidemia:  -continue with statin. -LDL: 44; triglycerides 193  Hypertension: -BP improved today  Hypokalemia:  replete  obesity Body mass index is 29.66 kg/m.   Family Communication/Anticipated D/C date and plan/Code Status   DVT prophylaxis: heparin gtt Code Status: Full Code.  Family Communication: none at bedside Disposition Plan: cath today   Medical Consultants:     cards     Subjective:  No further chest pain-- cath today per patient  Objective:    Vitals:   09/29/18 2118 09/30/18 0609 09/30/18 0700 09/30/18 0912  BP: 130/78 (!) 144/92 134/86 134/86  Pulse: (!) 53 (!) 59 (!) 58 61  Resp:      Temp: 98.1 F (36.7 C) 97.8 F (36.6 C) (!) 97.5 F (36.4 C)   TempSrc: Oral Oral Oral   SpO2: 97% 98% 98%   Weight:  102 kg    Height:        Intake/Output Summary (Last 24 hours) at 09/30/2018 0949 Last data filed at 09/30/2018 3545 Gross per 24 hour  Intake 720 ml  Output -  Net 720 ml   Filed Weights   09/29/18 1050 09/30/18 0609  Weight: 103.5 kg 102 kg    Exam: In bed, NAD rrr No wheezing, no increased work of breathing No LE edema A+Ox3   Data Reviewed:   I have personally reviewed following labs and imaging studies:  Labs: Labs show the following:   Basic Metabolic Panel: Recent Labs  Lab 09/28/18 2040 09/30/18 0413  NA 136 137  K 3.3* 3.8  CL 103 105  CO2 25 24  GLUCOSE 102* 104*  BUN 12 14  CREATININE 1.13 1.13  CALCIUM 9.5 8.6*   GFR Estimated Creatinine Clearance: 102.5 mL/min (by C-G formula based on SCr of 1.13 mg/dL). Liver Function Tests: No results for input(s): AST, ALT, ALKPHOS, BILITOT, PROT, ALBUMIN in the last 168 hours. No results for input(s): LIPASE,  AMYLASE in the last 168 hours. No results for input(s): AMMONIA in the last 168 hours. Coagulation profile No results for input(s): INR, PROTIME in the last 168 hours.  CBC: Recent Labs  Lab 09/28/18 2040 09/29/18 0550 09/30/18 0413  WBC 9.6 9.4 9.1  HGB 14.4 13.4 13.8  HCT 42.7 39.7 41.4  MCV 89.9 91.3 90.2  PLT 305 299 285   Cardiac Enzymes: Recent Labs  Lab 09/29/18 0024 09/29/18 0550 09/29/18 1113  TROPONINI <0.03 <0.03 <0.03   BNP (last 3 results) No results for input(s): PROBNP in the last 8760 hours. CBG: No results for input(s): GLUCAP in the last 168 hours. D-Dimer: No results for input(s): DDIMER in the  last 72 hours. Hgb A1c: No results for input(s): HGBA1C in the last 72 hours. Lipid Profile: Recent Labs    09/30/18 0413  CHOL 106  HDL 23*  LDLCALC 44  TRIG 093*  CHOLHDL 4.6   Thyroid function studies: No results for input(s): TSH, T4TOTAL, T3FREE, THYROIDAB in the last 72 hours.  Invalid input(s): FREET3 Anemia work up: No results for input(s): VITAMINB12, FOLATE, FERRITIN, TIBC, IRON, RETICCTPCT in the last 72 hours. Sepsis Labs: Recent Labs  Lab 09/28/18 2040 09/29/18 0550 09/30/18 0413  WBC 9.6 9.4 9.1    Microbiology No results found for this or any previous visit (from the past 240 hour(s)).  Procedures and diagnostic studies:  Dg Chest 2 View  Result Date: 09/28/2018 CLINICAL DATA:  Chest pain and shortness of breath. EXAM: CHEST - 2 VIEW COMPARISON:  10/30/2016 FINDINGS: The cardiomediastinal contours are normal. Mild linear scarring in the lingula. Pulmonary vasculature is normal. No consolidation, pleural effusion, or pneumothorax. No acute osseous abnormalities are seen. IMPRESSION: No acute chest findings. Electronically Signed   By: Narda Rutherford M.D.   On: 09/28/2018 21:41    Medications:   . aspirin EC  81 mg Oral Daily  . atorvastatin  20 mg Oral QHS  . ezetimibe  10 mg Oral Daily  . losartan  50 mg Oral Daily  . metoprolol tartrate  50 mg Oral BID  . nicotine  21 mg Transdermal Daily  . sertraline  100 mg Oral Daily  . sodium chloride flush  3 mL Intravenous Q12H  . ticagrelor  90 mg Oral BID   Continuous Infusions: . sodium chloride    . sodium chloride    . heparin 1,350 Units/hr (09/29/18 1805)     LOS: 0 days   Joseph Art  [facility]   *Please refer to [email or web address], password [email or web address] to get updated schedule on who will round on this patient, as hospitalists switch teams weekly. If 7PM-7AM, please contact night-coverage at [email or web address], password [email or web address] for any overnight needs.  09/30/2018, 9:49 AM

## 2018-09-30 NOTE — Care Management Note (Signed)
Case Management Note  Patient Details  Name: Bruce King MRN: [id number] Date of Birth: 05/25/1971  Subjective/Objective:  Pt presented for chest pain. PTA independent from home. Pt uses [employer]  for [employer] and he sees [facility] for PCP.                   Action/Plan: No home needs identified at this time.   Expected Discharge Date:                  Expected Discharge Plan:  Home/Self Care  In-House Referral:  NA  Discharge planning Services  CM Consult  Post Acute Care Choice:  NA Choice offered to:  NA  DME Arranged:  N/A DME Agency:  NA  HH Arranged:  NA HH Agency:  NA  Status of Service:  Completed, signed off  If discussed at Long Length of Stay Meetings, dates discussed:    Additional Comments:  Gala Lewandowsky, RN 09/30/2018, 2:26 PM

## 2018-09-30 NOTE — Progress Notes (Signed)
ANTICOAGULATION CONSULT NOTE  Pharmacy Consult for Heparin  Indication: chest pain/ACS  Vital Signs: Temp: 97.5 F (36.4 C) (01/07 0700) Temp Source: Oral (01/07 0700) BP: 134/86 (01/07 0700) Pulse Rate: 58 (01/07 0700)  Labs: Recent Labs    09/28/18 2040 09/29/18 0024 09/29/18 0550 09/29/18 0809 09/29/18 1113 09/29/18 1554 09/30/18 0413  HGB 14.4  --  13.4  --   --   --  13.8  HCT 42.7  --  39.7  --   --   --  41.4  PLT 305  --  299  --   --   --  285  HEPARINUNFRC  --   --   --  0.65  --  0.27* 0.48  CREATININE 1.13  --   --   --   --   --  1.13  TROPONINI  --  <0.03 <0.03  --  <0.03  --   --     Estimated Creatinine Clearance: 102.5 mL/min (by C-G formula based on SCr of 1.13 mg/dL).  Assessment: 48 y/o M on IV heparin for CP. No anticoagulation PTA. Plans for cath today -heparin level at goal, CBC stable    Goal of Therapy:  Heparin level 0.3-0.7 units/ml Monitor platelets by anticoagulation protocol: Yes   Plan:  No heparin changes needed Will follow plans post cath  Daily heparin level and CBC

## 2018-09-30 NOTE — Discharge Summary (Signed)
Physician Discharge Summary  Bruce King YPP:[id number] DOB: [birth year] DOA: 09/28/2018  PCP: Babs Sciara, MD  Admit date: 09/28/2018 Discharge date: 09/30/2018  Admitted From: home Discharge disposition: home   Recommendations for Outpatient Follow-Up:   1. Tobacco cessation   Discharge Diagnosis:   Principal Problem:   Chest pain Active Problems:   Generalized anxiety disorder   Hyperlipidemia   Tobacco use   Status post coronary artery stent placement   CAD (coronary artery disease)   Essential hypertension   Unstable angina (HCC)    Discharge Condition: Improved.  Diet recommendation: Low sodium, heart healthy  Wound care: None.  Code status: Full.   History of Present Illness:   Bruce King is a 48 y.o. male with medical history significant of coronary artery disease status post previous stenting, GERD, depression with anxiety, hypertension, hyperlipidemia who presents to the ER with central chest pain rated as 7 out of 10.  He has been having on and off for about a week.  Associated with elevated blood pressure and headache.  This is getting worse today.  The pain radiates to his left jaw.  Not relieved by his home nitroglycerin.  Patient continues to smoke cigarettes despite his previous MI.  In the ER initial evaluation showed no evidence of ST elevation MI.  Enzymes are negative and EKG stable.  Pain was relieved with nitroglycerin and morphine.  Patient is being admitted for MI rule out.   Hospital Course by Problem:   chest pain:  -CE negative and thus re-assuring but patient describes this episode as being the exact same as prior when he got 3 stents -has not missed any doses of medications (on brilenta) -cardiology consult appreciated  -s/p cath: Widely patent stents in LAD with mild disease elsewhere.  Improved flow through jailed diagonal branches.  Moderate disease in mCx (~60% - FFR 0.84)  Moderate disease in small caliber  2nd Diag prior to Bifurcation - not PCI target Normal LVEDP  tobacco abuse: -encourage cessation -not ready currently  hyperlipidemia:  -continue with home meds -LDL: 44; triglycerides 193  Hypertension: -BP improved today- continue home regimen  Hypokalemia:  repleted  overweight Body mass index is 29.66 kg/m.     Medical Consultants:    cards  Discharge Exam:   Vitals:   09/30/18 1304 09/30/18 1335  BP: 125/80 127/76  Pulse: (!) 56 (!) 59  Resp: 18 18  Temp:    SpO2: 99% 99%   Vitals:   09/30/18 1129 09/30/18 1233 09/30/18 1304 09/30/18 1335  BP: (!) 136/92 (!) 142/76 125/80 127/76  Pulse: (!) 54 (!) 58 (!) 56 (!) 59  Resp: 19 18 18 18   Temp:  97.7 F (36.5 C)    TempSrc:  Oral    SpO2: 96% 99% 99% 99%  Weight:      Height:        General exam: Appears calm and comfortable  The results of significant diagnostics from this hospitalization (including imaging, microbiology, ancillary and laboratory) are listed below for reference.     Procedures and Diagnostic Studies:   Dg Chest 2 View  Result Date: 09/28/2018 CLINICAL DATA:  Chest pain and shortness of breath. EXAM: CHEST - 2 VIEW COMPARISON:  10/30/2016 FINDINGS: The cardiomediastinal contours are normal. Mild linear scarring in the lingula. Pulmonary vasculature is normal. No consolidation, pleural effusion, or pneumothorax. No acute osseous abnormalities are seen. IMPRESSION: No acute chest findings. Electronically Signed   By:  Narda Rutherford M.D.   On: 09/28/2018 21:41     Labs:   Basic Metabolic Panel: Recent Labs  Lab 09/28/18 2040 09/30/18 0413  NA 136 137  K 3.3* 3.8  CL 103 105  CO2 25 24  GLUCOSE 102* 104*  BUN 12 14  CREATININE 1.13 1.13  CALCIUM 9.5 8.6*   GFR Estimated Creatinine Clearance: 102.5 mL/min (by C-G formula based on SCr of 1.13 mg/dL). Liver Function Tests: No results for input(s): AST, ALT, ALKPHOS, BILITOT, PROT, ALBUMIN in the last 168 hours. No  results for input(s): LIPASE, AMYLASE in the last 168 hours. No results for input(s): AMMONIA in the last 168 hours. Coagulation profile No results for input(s): INR, PROTIME in the last 168 hours.  CBC: Recent Labs  Lab 09/28/18 2040 09/29/18 0550 09/30/18 0413  WBC 9.6 9.4 9.1  HGB 14.4 13.4 13.8  HCT 42.7 39.7 41.4  MCV 89.9 91.3 90.2  PLT 305 299 285   Cardiac Enzymes: Recent Labs  Lab 09/29/18 0024 09/29/18 0550 09/29/18 1113  TROPONINI <0.03 <0.03 <0.03   BNP: Invalid input(s): POCBNP CBG: No results for input(s): GLUCAP in the last 168 hours. D-Dimer No results for input(s): DDIMER in the last 72 hours. Hgb A1c No results for input(s): HGBA1C in the last 72 hours. Lipid Profile Recent Labs    09/30/18 0413  CHOL 106  HDL 23*  LDLCALC 44  TRIG 824*  CHOLHDL 4.6   Thyroid function studies No results for input(s): TSH, T4TOTAL, T3FREE, THYROIDAB in the last 72 hours.  Invalid input(s): FREET3 Anemia work up No results for input(s): VITAMINB12, FOLATE, FERRITIN, TIBC, IRON, RETICCTPCT in the last 72 hours. Microbiology No results found for this or any previous visit (from the past 240 hour(s)).   Discharge Instructions:   Discharge Instructions    Diet - low sodium heart healthy   Complete by:  As directed    Increase activity slowly   Complete by:  As directed      Allergies as of 09/30/2018      Reactions   Bactrim [sulfamethoxazole-trimethoprim] Other (See Comments)   Felt Drunk   Lipitor [atorvastatin] Other (See Comments)   Stiffness in joints   Pravastatin Other (See Comments)   stiffness      Medication List    TAKE these medications   acetaminophen 500 MG tablet Commonly known as:  TYLENOL Take 1,000 mg by mouth every 6 (six) hours as needed.   ALPRAZolam 0.5 MG tablet Commonly known as:  XANAX 1 qid prn What changed:    how much to take  how to take this  when to take this  reasons to take this  additional  instructions   aspirin 81 MG tablet Take 81 mg by mouth daily.   atorvastatin 20 MG tablet Commonly known as:  LIPITOR Take 1 tablet (20 mg total) by mouth daily at 6 PM. What changed:  when to take this   ezetimibe 10 MG tablet Commonly known as:  ZETIA Take 1 tablet (10 mg total) by mouth daily.   losartan 50 MG tablet Commonly known as:  COZAAR Take 1 tablet (50 mg total) by mouth daily.   metoprolol tartrate 50 MG tablet Commonly known as:  LOPRESSOR TAKE  (1)  TABLET TWICE A DAY. What changed:  See the new instructions.   nitroGLYCERIN 0.4 MG SL tablet Commonly known as:  NITROSTAT Place 1 tablet (0.4 mg total) every 5 (five) minutes as needed under the  tongue for chest pain.   sertraline 100 MG tablet Commonly known as:  ZOLOFT Take 1 tablet (100 mg total) by mouth daily.   ticagrelor 90 MG Tabs tablet Commonly known as:  BRILINTA Take 1 tablet (90 mg total) by mouth 2 (two) times daily.      Follow-up Information    Babs SciaraLuking, Scott A, MD Follow up in 1 week(s).   Specialty:  Family Medicine Contact information: [address] [place] [address] [phone]        Corky CraftsVaranasi, Jayadeep S, MD .   Specialties:  Cardiology, Radiology, Interventional Cardiology Contact information: [address] [place] [address] [phone]            Time coordinating discharge: 25 min  Signed:  Joseph ArtJessica U Asuzena Weis DO  [facility] 09/30/2018, 3:11 PM

## 2018-10-08 ENCOUNTER — Encounter: Payer: Self-pay | Admitting: Physician Assistant

## 2018-10-08 ENCOUNTER — Ambulatory Visit (INDEPENDENT_AMBULATORY_CARE_PROVIDER_SITE_OTHER): Payer: Self-pay | Admitting: Physician Assistant

## 2018-10-08 VITALS — BP 132/88 | HR 71 | Ht 73.0 in | Wt 233.4 lb

## 2018-10-08 DIAGNOSIS — I25118 Atherosclerotic heart disease of native coronary artery with other forms of angina pectoris: Secondary | ICD-10-CM

## 2018-10-08 DIAGNOSIS — E782 Mixed hyperlipidemia: Secondary | ICD-10-CM

## 2018-10-08 DIAGNOSIS — I10 Essential (primary) hypertension: Secondary | ICD-10-CM

## 2018-10-08 DIAGNOSIS — Z720 Tobacco use: Secondary | ICD-10-CM

## 2018-10-08 NOTE — Progress Notes (Signed)
Cardiology Office Note    Date:  10/08/2018   ID:  Bruce King Hirt, DOB 01/28/1971, MRN [id number]  PCP:  Babs SciaraLuking, Scott A, MD  Cardiologist:  Dr. Eldridge DaceVaranasi   Chief Complaint: Hospital follow up  History of Present Illness:   Bruce King Stocks is a 48 y.o. male with a history of CAD s/p DES to LAD x3 10/2016, hypertension, hyperlipidemia, anxiety, and ongoing tobacco use presents for hospital follow up  Admitted 09/29/18 for unstable angina. Troponin remained negative. Cath as below: SUMMARY  Widely patent stents in LAD with mild disease elsewhere.  Improved flow through jailed diagonal branches.  Moderate disease in mCx (~60% - FFR 0.84)  Moderate disease in small caliber 2nd Diag prior to Bifurcation - not PCI target  Normal LVEDP  Recommended: aggressive medical therapy.  Here today for follow up. No recurrent symptoms. He has joint Y, starting tomorrow. Had lots of question regarding what he can do or not. All answered. No chest pain, dyspnea, PND, LE edema, palpitations, dizziness, melena or syncope. He is planning to quit smoking and start Wellbutrin.   Past Medical History:  Diagnosis Date  . Anxiety   . Chest pain 09/2018  . Coronary artery disease   . Depression   . GAD (generalized anxiety disorder)   . GERD (gastroesophageal reflux disease)   . History of hiatal hernia   . Hyperlipidemia   . NSTEMI (non-ST elevated myocardial infarction) (HCC) 10/30/2016    Past Surgical History:  Procedure Laterality Date  . CORONARY ANGIOPLASTY WITH STENT PLACEMENT  10/31/2016   "3 stents"  . CORONARY STENT INTERVENTION N/A 10/31/2016   Procedure: Coronary Stent Intervention;  Surgeon: Iran OuchMuhammad A Arida, MD;  Location: [facility];  Service: Cardiovascular;  Laterality: N/A;  . FRACTURE SURGERY    . INTRAVASCULAR PRESSURE WIRE/FFR STUDY N/A 09/30/2018   Procedure: INTRAVASCULAR PRESSURE WIRE/FFR STUDY;  Surgeon: Marykay LexHarding, David W, MD;  [facility];   Service: Cardiovascular;  Laterality: N/A;  . LEFT HEART CATH AND CORONARY ANGIOGRAPHY N/A 10/31/2016   Procedure: Left Heart Cath and Coronary Angiography;  Surgeon: Iran OuchMuhammad A Arida, MD;  Location: [facility];  Service: Cardiovascular;  Laterality: N/A;  . LEFT HEART CATH AND CORONARY ANGIOGRAPHY N/A 09/30/2018   Procedure: LEFT HEART CATH AND CORONARY ANGIOGRAPHY;  Surgeon: Marykay LexHarding, David W, MD;  [facility];  Service: Cardiovascular;  Laterality: N/A;  . ORIF TIBIA & FIBULA FRACTURES Right ~ 05/1996   "titanium rod"    Current Medications: Prior to Admission medications   Medication Sig Start Date End Date Taking? Authorizing Provider  acetaminophen (TYLENOL) 500 MG tablet Take 1,000 mg by mouth every 6 (six) hours as needed.    [provider]  ALPRAZolam Prudy Feeler(XANAX) 0.5 MG tablet 1 qid prn Patient taking differently: Take 0.5 mg by mouth 4 (four) times daily as needed for anxiety.  09/02/18   Babs SciaraLuking, Scott A, MD  aspirin 81 MG tablet Take 81 mg by mouth daily.     [provider]  atorvastatin (LIPITOR) 20 MG tablet Take 1 tablet (20 mg total) by mouth daily at 6 PM. Patient taking differently: Take 20 mg by mouth daily.  09/02/18   Babs SciaraLuking, Scott A, MD  ezetimibe (ZETIA) 10 MG tablet Take 1 tablet (10 mg total) by mouth daily. 11/09/16   Jodelle GrossLawrence, Kathryn M, NP  losartan (COZAAR) 50 MG tablet Take 1 tablet (50 mg total) by mouth daily. 09/02/18  Babs SciaraLuking, Scott A, MD  metoprolol tartrate (LOPRESSOR) 50 MG tablet TAKE  (1)  TABLET TWICE A DAY. Patient taking differently: Take 50 mg by mouth 2 (two) times daily.  08/08/18   Corky CraftsVaranasi, Jayadeep S, MD  nitroGLYCERIN (NITROSTAT) 0.4 MG SL tablet Place 1 tablet (0.4 mg total) every 5 (five) minutes as needed under the tongue for chest pain. 08/06/17   Corky CraftsVaranasi, Jayadeep S, MD  sertraline (ZOLOFT) 100 MG tablet Take 1 tablet (100 mg total) by mouth daily. 09/02/18   Babs SciaraLuking, Scott A, MD  ticagrelor (BRILINTA) 90  MG TABS tablet Take 1 tablet (90 mg total) by mouth 2 (two) times daily. 11/09/16   Jodelle GrossLawrence, Kathryn M, NP    Allergies:   Bactrim [sulfamethoxazole-trimethoprim]; Lipitor [atorvastatin]; and Pravastatin   Social History   Socioeconomic History  . Marital status: Divorced    Spouse name: Not on file  . Number of children: Not on file  . Years of education: Not on file  . Highest education level: Not on file  Occupational History  . Not on file  Social Needs  . Financial resource strain: Not on file  . Food insecurity:    Worry: Not on file    Inability: Not on file  . Transportation needs:    Medical: Not on file    Non-medical: Not on file  Tobacco Use  . Smoking status: Current Every Day Smoker    Packs/day: 1.00    Years: 25.00    Pack years: 25.00    Types: Cigarettes    Start date: 09/28/1989  . Smokeless tobacco: Former NeurosurgeonUser    Types: Chew, Snuff  Substance and Sexual Activity  . Alcohol use: Yes    Alcohol/week: 14.0 standard drinks    Types: 14 Cans of beer per week    Comment: 10/31/2016 "only drink on Fridays"  . Drug use: No  . Sexual activity: Yes  Lifestyle  . Physical activity:    Days per week: Not on file    Minutes per session: Not on file  . Stress: Not on file  Relationships  . Social connections:    Talks on phone: Not on file    Gets together: Not on file    Attends religious service: Not on file    Active member of club or organization: Not on file    Attends meetings of clubs or organizations: Not on file    Relationship status: Not on file  Other Topics Concern  . Not on file  Social History Narrative  . Not on file     Family History:  The patient's family history includes Heart attack in his father; Hypertension in his mother.   ROS:   Please see the history of present illness.    ROS All other systems reviewed and are negative.   PHYSICAL EXAM:   VS:  BP 132/88   Pulse 71   Ht 6\' 1"  (1.854 m)   Wt 233 lb 6.4 oz (105.9 kg)    SpO2 96%   BMI 30.79 kg/m    GEN: Well nourished, well developed, in no acute distress  HEENT: normal  Neck: no JVD, carotid bruits, or masses Cardiac: RRR; no murmurs, rubs, or gallops,no edema  Respiratory:  clear to auscultation bilaterally, normal work of breathing GI: soft, nontender, nondistended, + BS MS: no deformity or atrophy  Skin: warm and dry, no rash Neuro:  Alert and Oriented x 3, Strength and sensation are intact Psych: euthymic mood, full  affect  Wt Readings from Last 3 Encounters:  10/08/18 233 lb 6.4 oz (105.9 kg)  09/30/18 224 lb 12.8 oz (102 kg)  09/02/18 232 lb 0.8 oz (105.3 kg)      Studies/Labs Reviewed:   EKG:  EKG is not ordered today.    Recent Labs: 09/30/2018: BUN 14; Creatinine, Ser 1.13; Hemoglobin 13.8; Platelets 285; Potassium 3.8; Sodium 137   Lipid Panel    Component Value Date/Time   CHOL 106 09/30/2018 0413   CHOL 112 08/06/2017 1146   TRIG 193 (H) 09/30/2018 0413   HDL 23 (L) 09/30/2018 0413   HDL 24 (L) 08/06/2017 1146   CHOLHDL 4.6 09/30/2018 0413   VLDL 39 09/30/2018 0413   LDLCALC 44 09/30/2018 0413   LDLCALC 26 08/06/2017 1146    Additional studies/ records that were reviewed today include:   INTRAVASCULAR PRESSURE WIRE/FFR STUDY   09/30/2018  LEFT HEART CATH AND CORONARY ANGIOGRAPHY  Conclusion     Mid Cx lesion is 60% stenosed. FFR 0.84  Previously placed Prox LAD to Mid LAD DES stents (overlapping resolute Onyx DES stents) are widely patent.  Previously placed Dist LAD DES stent (resolute Onyx) is widely patent.  JAILED DIAGONAL BRANCES: Ost 1st Diag lesion is 50% stenosed. Ost 2nd Diag lesion is 55% stenosed. Both improved from prior cath post PCI  Post Atrio lesion is 45% stenosed.  LV end diastolic pressure is normal. The left ventricular ejection fraction is 50-55% by visual estimate.   SUMMARY  Widely patent stents in LAD with mild disease elsewhere.  Improved flow through jailed diagonal  branches.  Moderate disease in mCx (~60% - FFR 0.84)  Moderate disease in small caliber 2nd Diag prior to Bifurcation - not PCI target  Normal LVEDP  RECOMMENDATIONS  Return to nursing unit for ongoing care and TR band removal (per PCI protocol because of extra heparin). ? Anticipate discharge later today.  Continue aggressive medical management.     ASSESSMENT & PLAN:    1. CAD s/p LAD stent x 3 in 2018 Last cath 09/2018 as above. Medical therapy recommended. Continue DAPT with ASA & Brillinta (length of therapy per MD during follow up).  Continue BB, ARB and statin.   2. HLD - 09/30/2018: Cholesterol 106; HDL 23; LDL Cholesterol 44; Triglycerides 193; VLDL 39  - Continue Lipitor and Zetia.   3. HTN - BP stable on current medications.  4. Tobacco smoking - He has plan to quit and start doing exercise.     Medication Adjustments/Labs and Tests Ordered: Current medicines are reviewed at length with the patient today.  Concerns regarding medicines are outlined above.  Medication changes, Labs and Tests ordered today are listed in the Patient Instructions below. Patient Instructions  Medication Instructions:   Your physician recommends that you continue on your current medications as directed. Please refer to the Current Medication list given to you today.   If you need a refill on your cardiac medications before your next appointment, please call your pharmacy.   Lab work:  NONE ORDERED  TODAY   If you have labs (blood work) drawn today and your tests are completely normal, you will receive your results only by: Marland Kitchen MyChart Message (if you have MyChart) OR . A paper copy in the mail If you have any lab test that is abnormal or we need to change your treatment, we will call you to review the results.  Testing/Procedures:   Follow-Up: At [facility], you and your health  needs are our priority.  As part of our continuing mission to provide you with exceptional  heart care, we have created designated Provider Care Teams.  These Care Teams include your primary Cardiologist (physician) and Advanced Practice Providers (APPs -  Physician Assistants and Nurse Practitioners) who all work together to provide you with the care you need, when you need it. You will need a follow up appointment in 3 months to 4 months  Please call our office 2 months in advance to schedule this appointment.  You may see Lance Muss, MD or one of the following Advanced Practice Providers on your designated Care Team:   [place], PA-C Ronie Spies, PA-C . Jacolyn Reedy, PA-C  Any Other Special Instructions Will Be Listed Below (If Applicable).      Lorelei Pont, [place]  10/08/2018 10:53 AM    [facility] [address] [place], [place], [place]  [address] Phone: [phone]; Fax: [phone]

## 2018-10-08 NOTE — Patient Instructions (Signed)
Medication Instructions:   Your physician recommends that you continue on your current medications as directed. Please refer to the Current Medication list given to you today.   If you need a refill on your cardiac medications before your next appointment, please call your pharmacy.   Lab work:  NONE ORDERED  TODAY   If you have labs (blood work) drawn today and your tests are completely normal, you will receive your results only by: Marland Kitchen. MyChart Message (if you have MyChart) OR . A paper copy in the mail If you have any lab test that is abnormal or we need to change your treatment, we will call you to review the results.  Testing/Procedures:   Follow-Up: At [facility], you and your health needs are our priority.  As part of our continuing mission to provide you with exceptional heart care, we have created designated Provider Care Teams.  These Care Teams include your primary Cardiologist (physician) and Advanced Practice Providers (APPs -  Physician Assistants and Nurse Practitioners) who all work together to provide you with the care you need, when you need it. You will need a follow up appointment in 3 months to 4 months  Please call our office 2 months in advance to schedule this appointment.  You may see Lance MussJayadeep Varanasi, MD or one of the following Advanced Practice Providers on your designated Care Team:   WaverlyBrittainy Simmons, PA-C Ronie Spiesayna Dunn, PA-C . Jacolyn ReedyMichele Lenze, PA-C  Any Other Special Instructions Will Be Listed Below (If Applicable).

## 2019-01-05 ENCOUNTER — Telehealth: Payer: Self-pay

## 2019-01-05 NOTE — Telephone Encounter (Signed)
Virtual Visit Pre-Appointment Phone Call  Steps For Call:  1. Confirm consent - "In the setting of the current Covid19 crisis, you are scheduled for a (phone or video) visit with your provider on (date) at (time).  Just as we do with many in-office visits, in order for you to participate in this visit, we must obtain consent.  If you'd like, I can send this to your mychart (if signed up) or email for you to review.  Otherwise, I can obtain your verbal consent now.  All virtual visits are billed to your insurance company just like a normal visit would be.  By agreeing to a virtual visit, we'd like you to understand that the technology does not allow for your provider to perform an examination, and thus may limit your provider's ability to fully assess your condition.  Finally, though the technology is pretty good, we cannot assure that it will always work on either your or our end, and in the setting of a video visit, we may have to convert it to a phone-only visit.  In either situation, we cannot ensure that we have a secure connection.  Are you willing to proceed?"  2. Give patient instructions for WebEx download to smartphone as below if video visit  3. Advise patient to be prepared with any vital sign or heart rhythm information, their current medicines, and a piece of paper and pen handy for any instructions they may receive the day of their visit  4. Inform patient they will receive a phone call 15 minutes prior to their appointment time (may be from unknown caller ID) so they should be prepared to answer  5. Confirm that appointment type is correct in Epic appointment notes (video vs telephone)    TELEPHONE CALL NOTE  Bruce King has been deemed a candidate for a follow-up tele-health visit to limit community exposure during the Covid-19 pandemic. I spoke with the patient via phone to ensure availability of phone/video source, confirm preferred email & phone number, and discuss  instructions and expectations.  I reminded Bruce King to be prepared with any vital sign and/or heart rhythm information that could potentially be obtained via home monitoring, at the time of his visit. I reminded Bruce King to expect a phone call at the time of his visit if his visit.  Did the patient verbally acknowledge consent to treatment? yes  [name], [facility] 01/05/2019 3:44 PM   DOWNLOADING THE WEBEX SOFTWARE TO SMARTPHONE  - If Apple, go to [employer] and type in WebEx in the search bar. Download [employer], the blue/green circle. The app is free but as with any other app downloads, their phone may require them to verify saved payment information or Apple password. The patient does NOT have to create an account.  - If Android, ask patient to go to [employer] and type in WebEx in the search bar. Download [employer], the blue/green circle. The app is free but as with any other app downloads, their phone may require them to verify saved payment information or Android password. The patient does NOT have to create an account.   CONSENT FOR TELE-HEALTH VISIT - PLEASE REVIEW  I hereby voluntarily request, consent and authorize [facility] and its employed or contracted physicians, physician assistants, nurse practitioners or other licensed health care professionals (the Practitioner), to provide me with telemedicine health care services (the "Services") as deemed necessary by the treating Practitioner.  I acknowledge and consent to receive the Services by the Practitioner via telemedicine. I understand that the telemedicine visit will involve communicating with the Practitioner through live audiovisual communication technology and the disclosure of certain medical information by electronic transmission. I acknowledge that I have been given the opportunity to request an in-person assessment or other available alternative prior to the telemedicine  visit and am voluntarily participating in the telemedicine visit.  I understand that I have the right to withhold or withdraw my consent to the use of telemedicine in the course of my care at any time, without affecting my right to future care or treatment, and that the Practitioner or I may terminate the telemedicine visit at any time. I understand that I have the right to inspect all information obtained and/or recorded in the course of the telemedicine visit and may receive copies of available information for a reasonable fee.  I understand that some of the potential risks of receiving the Services via telemedicine include:  Marland Kitchen Delay or interruption in medical evaluation due to technological equipment failure or disruption; . Information transmitted may not be sufficient (e.g. poor resolution of images) to allow for appropriate medical decision making by the Practitioner; and/or  . In rare instances, security protocols could fail, causing a breach of personal health information.  Furthermore, I acknowledge that it is my responsibility to provide information about my medical history, conditions and care that is complete and accurate to the best of my ability. I acknowledge that Practitioner's advice, recommendations, and/or decision may be based on factors not within their control, such as incomplete or inaccurate data provided by me or distortions of diagnostic images or specimens that may result from electronic transmissions. I understand that the practice of medicine is not an exact science and that Practitioner makes no warranties or guarantees regarding treatment outcomes. I acknowledge that I will receive a copy of this consent concurrently upon execution via email to the email address I last provided but may also request a printed copy by calling the office of [place].    I understand that my insurance will be billed for this visit.   I have read or had this consent read to me. . I  understand the contents of this consent, which adequately explains the benefits and risks of the Services being provided via telemedicine.  . I have been provided ample opportunity to ask questions regarding this consent and the Services and have had my questions answered to my satisfaction. . I give my informed consent for the services to be provided through the use of telemedicine in my medical care  By participating in this telemedicine visit I agree to the above.

## 2019-01-05 NOTE — Telephone Encounter (Signed)
Called to set up possible evisit, left message asking pt to call the office.  

## 2019-01-05 NOTE — Telephone Encounter (Signed)
Patient is returning your call.  

## 2019-01-07 NOTE — Progress Notes (Signed)
Virtual Visit via Video Note   This visit type was conducted due to national recommendations for restrictions regarding the COVID-19 Pandemic (e.g. social distancing) in an effort to limit this patient's exposure and mitigate transmission in our community.  Due to his co-morbid illnesses, this patient is at least at moderate risk for complications without adequate follow up.  This format is felt to be most appropriate for this patient at this time.  All issues noted in this document were discussed and addressed.  A limited physical exam was performed with this format.  Please refer to the patient's chart for his consent to telehealth for [facility].   Evaluation Performed:  Follow-up visit  Date:  01/08/2019   ID:  Bruce PastelFranklin D Skowron, DOB 07/09/1971, MRN [id number]  Patient Location: Home Provider Location: Home  PCP:  Babs SciaraLuking, Scott A, MD  Cardiologist:  Lance MussJayadeep Varanasi, MD  Electrophysiologist:  None   Chief Complaint:  CAD  History of Present Illness:    Bruce King is a 48 y.o. male with with a history of CAD s/p DES to LAD x3 10/2016, hypertension, hyperlipidemia, anxiety, and ongoing tobacco use presents for hospital follow up  Admitted 09/29/18 for unstable angina. Troponin remained negative. Cath as below: SUMMARY  Widely patent stents in LAD with mild disease elsewhere. Improved flow through jailed diagonal branches.  Moderate disease in mCx (~60% - FFR 0.84)  Moderate disease in small caliber 2nd Diag prior to Bifurcation - not PCI target  Normal LVEDP  Recommended: aggressive medical therapy.  He still works at the [employer].  THey have hand sanitizer and using a mask.  Since his cath in Jan 2020, he has felt well.  BP was high at that time.  SInce then, BP has been controlled. Losartan was increased.   He continues to walk regularly.  He still has trouble losing weight.    The patient does not have symptoms concerning for COVID-19 infection (fever,  chills, cough, or new shortness of breath).    Past Medical History:  Diagnosis Date  . Anxiety   . Chest pain 09/2018  . Coronary artery disease   . Depression   . GAD (generalized anxiety disorder)   . GERD (gastroesophageal reflux disease)   . History of hiatal hernia   . Hyperlipidemia   . NSTEMI (non-ST elevated myocardial infarction) (HCC) 10/30/2016   Past Surgical History:  Procedure Laterality Date  . CORONARY ANGIOPLASTY WITH STENT PLACEMENT  10/31/2016   "3 stents"  . CORONARY STENT INTERVENTION N/A 10/31/2016   Procedure: Coronary Stent Intervention;  Surgeon: Iran OuchMuhammad A Arida, MD;  Location: [facility] INVASIVE CV LAB;  Service: Cardiovascular;  Laterality: N/A;  . FRACTURE SURGERY    . INTRAVASCULAR PRESSURE WIRE/FFR STUDY N/A 09/30/2018   Procedure: INTRAVASCULAR PRESSURE WIRE/FFR STUDY;  Surgeon: Marykay LexHarding, David W, MD;  [facility];  Service: Cardiovascular;  Laterality: N/A;  . LEFT HEART CATH AND CORONARY ANGIOGRAPHY N/A 10/31/2016   Procedure: Left Heart Cath and Coronary Angiography;  Surgeon: Iran OuchMuhammad A Arida, MD;  Location: [facility] INVASIVE CV LAB;  Service: Cardiovascular;  Laterality: N/A;  . LEFT HEART CATH AND CORONARY ANGIOGRAPHY N/A 09/30/2018   Procedure: LEFT HEART CATH AND CORONARY ANGIOGRAPHY;  Surgeon: Marykay LexHarding, David W, MD;  [facility];  Service: Cardiovascular;  Laterality: N/A;  . ORIF TIBIA & FIBULA FRACTURES Right ~ 05/1996   "titanium rod"     Current Meds  Medication Sig  . acetaminophen (TYLENOL)  500 MG tablet Take 1,000 mg by mouth every 6 (six) hours as needed.  . ALPRAZolam (XANAX) 0.5 MG tablet Take 0.5 mg by mouth daily as needed for anxiety.  Marland Kitchen aspirin 81 MG tablet Take 81 mg by mouth daily.   Marland Kitchen atorvastatin (LIPITOR) 20 MG tablet Take 1 tablet (20 mg total) by mouth daily at 6 PM.  . ezetimibe (ZETIA) 10 MG tablet Take 1 tablet (10 mg total) by mouth daily.  Marland Kitchen losartan (COZAAR) 50 MG tablet Take 1 tablet (50 mg total)  by mouth daily.  . metoprolol tartrate (LOPRESSOR) 50 MG tablet TAKE  (1)  TABLET TWICE A DAY.  . nitroGLYCERIN (NITROSTAT) 0.4 MG SL tablet Place 1 tablet (0.4 mg total) every 5 (five) minutes as needed under the tongue for chest pain.  Marland Kitchen sertraline (ZOLOFT) 100 MG tablet Take 1 tablet (100 mg total) by mouth daily.  . ticagrelor (BRILINTA) 90 MG TABS tablet Take 1 tablet (90 mg total) by mouth 2 (two) times daily.     Allergies:   Bactrim [sulfamethoxazole-trimethoprim]; Lipitor [atorvastatin]; and Pravastatin   Social History   Tobacco Use  . Smoking status: Current Every Day Smoker    Packs/day: 1.00    Years: 25.00    Pack years: 25.00    Types: Cigarettes    Start date: 09/28/1989  . Smokeless tobacco: Former Neurosurgeon    Types: Chew, Snuff  Substance Use Topics  . Alcohol use: Yes    Alcohol/week: 14.0 standard drinks    Types: 14 Cans of beer per week    Comment: 10/31/2016 "only drink on Fridays"  . Drug use: No     Family Hx: The patient's family history includes Heart attack in his father; Hypertension in his mother.  ROS:   Please see the history of present illness.    difficulty losing weight All other systems reviewed and are negative.   Prior CV studies:   The following studies were reviewed today:  Cath results reviewed  Labs/Other Tests and Data Reviewed:    EKG:  NSR in Jan 2020  Recent Labs: 09/30/2018: BUN 14; Creatinine, Ser 1.13; Hemoglobin 13.8; Platelets 285; Potassium 3.8; Sodium 137   Recent Lipid Panel Lab Results  Component Value Date/Time   CHOL 106 09/30/2018 04:13 AM   CHOL 112 08/06/2017 11:46 AM   TRIG 193 (H) 09/30/2018 04:13 AM   HDL 23 (L) 09/30/2018 04:13 AM   HDL 24 (L) 08/06/2017 11:46 AM   CHOLHDL 4.6 09/30/2018 04:13 AM   LDLCALC 44 09/30/2018 04:13 AM   LDLCALC 26 08/06/2017 11:46 AM    Wt Readings from Last 3 Encounters:  01/08/19 230 lb (104.3 kg)  10/08/18 233 lb 6.4 oz (105.9 kg)  09/30/18 224 lb 12.8 oz (102 kg)      Objective:    Vital Signs:  BP (!) 142/90   Pulse (!) 58   Ht 6\' 1"  (1.854 m)   Wt 230 lb (104.3 kg)   BMI 30.34 kg/m    Well nourished, well developed male in no acute distress. No shortness of breath RIght wrist without bruising  Exam limited by format  ASSESSMENT & PLAN:    1. CAD: s/p LAD stent x 3 in 2018.  Patent stents in Jan 2020.  Continue aggressive secondary prevention.  Continue Brilinta.  Stop aspirin at this time.  2. Hyperlipidemia: LDL 44 in Jan 2020.  TG were elevated in Jan.  He drinks regular sodas. Frequently.  Decrease sugar intake.  3. HTN: BP should be checked at home.  Today's readings before medicines 4. Tobacco abuse: He continues to smoke.  He is still having problems. He cut back but can't stop. No more chewing tobacco. He has Wellbutrin at home to try to stop smoking.   COVID-19 Education: The signs and symptoms of COVID-19 were discussed with the patient and how to seek care for testing (follow up with PCP or arrange E-visit).  The importance of social distancing was discussed today.  Time:   Today, I have spent 25 minutes with the patient with telehealth technology discussing the above problems.     Medication Adjustments/Labs and Tests Ordered: Current medicines are reviewed at length with the patient today.  Concerns regarding medicines are outlined above.   Tests Ordered: No orders of the defined types were placed in this encounter.   Medication Changes: No orders of the defined types were placed in this encounter.   Disposition:  Follow up in 6 month(s)  Signed, Lance Muss, MD  01/08/2019 9:08 AM    [facility]

## 2019-01-08 ENCOUNTER — Other Ambulatory Visit: Payer: Self-pay

## 2019-01-08 ENCOUNTER — Telehealth (INDEPENDENT_AMBULATORY_CARE_PROVIDER_SITE_OTHER): Payer: Self-pay | Admitting: Interventional Cardiology

## 2019-01-08 ENCOUNTER — Encounter: Payer: Self-pay | Admitting: Interventional Cardiology

## 2019-01-08 VITALS — BP 142/90 | HR 58 | Ht 73.0 in | Wt 230.0 lb

## 2019-01-08 DIAGNOSIS — E782 Mixed hyperlipidemia: Secondary | ICD-10-CM

## 2019-01-08 DIAGNOSIS — I25118 Atherosclerotic heart disease of native coronary artery with other forms of angina pectoris: Secondary | ICD-10-CM

## 2019-01-08 DIAGNOSIS — I10 Essential (primary) hypertension: Secondary | ICD-10-CM

## 2019-01-08 DIAGNOSIS — I252 Old myocardial infarction: Secondary | ICD-10-CM

## 2019-01-08 DIAGNOSIS — Z720 Tobacco use: Secondary | ICD-10-CM

## 2019-01-08 DIAGNOSIS — Z955 Presence of coronary angioplasty implant and graft: Secondary | ICD-10-CM

## 2019-01-08 DIAGNOSIS — E785 Hyperlipidemia, unspecified: Secondary | ICD-10-CM

## 2019-01-08 MED ORDER — CLOPIDOGREL BISULFATE 75 MG PO TABS: 75.0000 mg | ORAL_TABLET | Freq: Every day | ORAL | 3 refills | Status: DC

## 2019-01-08 NOTE — Patient Instructions (Addendum)
Medication Instructions:  Your physician has recommended you make the following change in your medication:   1. STOP: Aspirin  2. Finish you current 4 month supply of ticagrelor (Brilinta) and then start clopidogrel (plavix) 75 mg once a day   Lab work: None Ordered  If you have labs (blood work) drawn today and your tests are completely normal, you will receive your results only by: Marland Kitchen MyChart Message (if you have MyChart) OR . A paper copy in the mail If you have any lab test that is abnormal or we need to change your treatment, we will call you to review the results.  Testing/Procedures: None ordered  Follow-Up: At [facility], you and your health needs are our priority.  As part of our continuing mission to provide you with exceptional heart care, we have created designated Provider Care Teams.  These Care Teams include your primary Cardiologist (physician) and Advanced Practice Providers (APPs -  Physician Assistants and Nurse Practitioners) who all work together to provide you with the care you need, when you need it. . You will need a follow up appointment in 6 months.  Please call our office 2 months in advance to schedule this appointment.  You may see Everette Rank, MD or one of the following Advanced Practice Providers on your designated Care Team:   . Robbie Lis, PA-C . Dayna Dunn, PA-C . Jacolyn Reedy, PA-C  Any Other Special Instructions Will Be Listed Below (If Applicable).

## 2019-03-11 ENCOUNTER — Other Ambulatory Visit: Payer: Self-pay | Admitting: Interventional Cardiology

## 2019-03-19 ENCOUNTER — Other Ambulatory Visit: Payer: Self-pay | Admitting: Family Medicine

## 2019-03-19 NOTE — Telephone Encounter (Signed)
Schedule virtual visit May have 1 refill 

## 2019-03-23 NOTE — Telephone Encounter (Signed)
LVM TO CALL OFFICE AND SCHEDULE VIRTUAL VISIT FOR MED CHECK

## 2019-03-23 NOTE — Telephone Encounter (Signed)
APPOINTMENT MADE FOR July 8TH AT 10:00

## 2019-04-01 ENCOUNTER — Ambulatory Visit (INDEPENDENT_AMBULATORY_CARE_PROVIDER_SITE_OTHER): Payer: Self-pay | Admitting: Family Medicine

## 2019-04-01 ENCOUNTER — Other Ambulatory Visit: Payer: Self-pay

## 2019-04-01 DIAGNOSIS — I10 Essential (primary) hypertension: Secondary | ICD-10-CM

## 2019-04-01 DIAGNOSIS — F411 Generalized anxiety disorder: Secondary | ICD-10-CM

## 2019-04-01 DIAGNOSIS — E782 Mixed hyperlipidemia: Secondary | ICD-10-CM

## 2019-04-01 DIAGNOSIS — Z720 Tobacco use: Secondary | ICD-10-CM

## 2019-04-01 MED ORDER — ALPRAZOLAM 0.5 MG PO TABS: ORAL_TABLET | ORAL | 5 refills | Status: DC

## 2019-04-01 MED ORDER — SILDENAFIL CITRATE 20 MG PO TABS: ORAL_TABLET | ORAL | 5 refills | Status: DC

## 2019-04-01 MED ORDER — NITROGLYCERIN 0.4 MG SL SUBL: SUBLINGUAL_TABLET | SUBLINGUAL | 3 refills | Status: DC

## 2019-04-01 NOTE — Progress Notes (Signed)
Subjective:    Patient ID: Bruce King, male    DOB: 02/01/1971, 48 y.o.   MRN: [id number] Video visit Hypertension This is a chronic problem. Pertinent negatives include no chest pain, headaches or shortness of breath. There are no compliance problems.   pt states he checks his BP daily and BP stays in the range of 130/82. Pt had Lipitor listed as allergy on med list but pt is currently on Lipitor and is doing well.    Patient does use tobacco products.  Patient knows they should quit.  Patient is aware that smoking/in use of tobacco products increases their risk of heart disease, cancer, and COPD- lung issues.  Patient has been counseled to quit smoking/tobacco products.   Patient here for follow-up regarding cholesterol.  The patient does have hyperlipidemia.  Patient does try to maintain a reasonable diet.  Patient does take the medication on a regular basis.  Denies missing a dose.  The patient denies any obvious side effects.  Prior blood work results reviewed with the patient.  The patient is aware of his cholesterol goals and the need to keep it under good control to lessen the risk of disease.  Patient for blood pressure check up.  The patient does have hypertension.  The patient is on medication.  Patient relates compliance with meds. Todays BP reviewed with the patient. Patient denies issues with medication. Patient relates reasonable diet. Patient tries to minimize salt. Patient aware of BP goals.  Patient does have underlying heart disease states he is not had any major setbacks recently  Patient does have anxiety related issues but states he is reduced his Xanax down to 2/day Virtual Visit via Video Note  I connected with Bruce King on 04/01/19 at 10:00 AM EDT by a video enabled telemedicine application and verified that I am speaking with the correct person using two identifiers.  Location: Patient: home Provider: office   I discussed the limitations of  evaluation and management by telemedicine and the availability of in person appointments. The patient expressed understanding and agreed to proceed.  History of Present Illness:    Observations/Objective:   Assessment and Plan:   Follow Up Instructions:    I discussed the assessment and treatment plan with the patient. The patient was provided an opportunity to ask questions and all were answered. The patient agreed with the plan and demonstrated an understanding of the instructions.   The patient was advised to call back or seek an in-person evaluation if the symptoms worsen or if the condition fails to improve as anticipated.  I provided 15 minutes of non-face-to-face time during this encounter.   [name]  [name], LPN     Review of Systems  Constitutional: Negative for activity change, fatigue and fever.  HENT: Negative for congestion and rhinorrhea.   Respiratory: Negative for cough and shortness of breath.   Cardiovascular: Negative for chest pain and leg swelling.  Gastrointestinal: Negative for abdominal pain, diarrhea and nausea.  Genitourinary: Negative for dysuria and hematuria.  Neurological: Negative for weakness and headaches.  Psychiatric/Behavioral: Negative for agitation and behavioral problems.       Objective:   Physical Exam Vitals signs reviewed.  Constitutional:      General: He is not in acute distress. HENT:     Head: Normocephalic and atraumatic.  Eyes:     General:        Right eye: No discharge.        Left eye: No  discharge.  Neck:     Trachea: No tracheal deviation.  Cardiovascular:     Rate and Rhythm: Normal rate and regular rhythm.     Heart sounds: Normal heart sounds. No murmur.  Pulmonary:     Effort: Pulmonary effort is normal. No respiratory distress.     Breath sounds: Normal breath sounds.  Lymphadenopathy:     Cervical: No cervical adenopathy.  Skin:    General: Skin is warm and dry.  Neurological:     Mental Status: He  is alert.     Coordination: Coordination normal.  Psychiatric:        Behavior: Behavior normal.           Assessment & Plan:  HTN- Patient was seen today as part of a visit regarding hypertension. The importance of healthy diet and regular physical activity was discussed. The importance of compliance with medications discussed.  Ideal goal is to keep blood pressure low elevated levels certainly below 160/73 when possible.  The patient was counseled that keeping blood pressure under control lessen his risk of complications.  The importance of regular follow-ups was discussed with the patient.  Low-salt diet such as DASH recommended.  Regular physical activity was recommended as well.  Patient was advised to keep regular follow-ups.  The patient was seen today as part of an evaluation regarding hyperlipidemia.  Recent lab work has been reviewed with the patient as well as the goals for good cholesterol care.  In addition to this medications have been discussed the importance of compliance with diet and medications discussed as well.  Finally the patient is aware that poor control of cholesterol, noncompliance can dramatically increase the risk of complications. The patient will keep regular office visits and the patient does agreed to periodic lab work.  Patient will keep follow-up with cardiology  Xanax he will use 2 or 3 times per day try to taper this down  Sertraline doing well for him he will continue this  Patient was encouraged to quit smoking  Refills on sildenafil given patient was cautioned never to take that with nitroglycerin

## 2019-04-22 ENCOUNTER — Other Ambulatory Visit: Payer: Self-pay | Admitting: Family Medicine

## 2019-06-09 ENCOUNTER — Other Ambulatory Visit: Payer: Self-pay | Admitting: Family Medicine

## 2019-06-09 ENCOUNTER — Other Ambulatory Visit: Payer: Self-pay | Admitting: Interventional Cardiology

## 2019-09-08 ENCOUNTER — Other Ambulatory Visit: Payer: Self-pay | Admitting: Family Medicine

## 2019-09-26 ENCOUNTER — Other Ambulatory Visit: Payer: Self-pay | Admitting: Family Medicine

## 2019-09-28 NOTE — Telephone Encounter (Signed)
Please schedule and then route back to nurses thanks 

## 2019-09-28 NOTE — Telephone Encounter (Signed)
Pt will call back when he gets off work to schedule

## 2019-09-28 NOTE — Telephone Encounter (Signed)
1 refill-follow-up by spring

## 2019-09-29 NOTE — Telephone Encounter (Signed)
Patient scheduled appointment for 10/30/19 for medication followup

## 2019-10-26 ENCOUNTER — Other Ambulatory Visit: Payer: Self-pay | Admitting: Family Medicine

## 2019-10-30 ENCOUNTER — Other Ambulatory Visit: Payer: Self-pay

## 2019-10-30 ENCOUNTER — Ambulatory Visit (INDEPENDENT_AMBULATORY_CARE_PROVIDER_SITE_OTHER): Payer: Self-pay | Admitting: Family Medicine

## 2019-10-30 DIAGNOSIS — F411 Generalized anxiety disorder: Secondary | ICD-10-CM

## 2019-10-30 DIAGNOSIS — I10 Essential (primary) hypertension: Secondary | ICD-10-CM

## 2019-10-30 DIAGNOSIS — Z79899 Other long term (current) drug therapy: Secondary | ICD-10-CM

## 2019-10-30 DIAGNOSIS — I25118 Atherosclerotic heart disease of native coronary artery with other forms of angina pectoris: Secondary | ICD-10-CM

## 2019-10-30 DIAGNOSIS — E785 Hyperlipidemia, unspecified: Secondary | ICD-10-CM

## 2019-10-30 MED ORDER — LOSARTAN POTASSIUM 50 MG PO TABS: 50.0000 mg | ORAL_TABLET | Freq: Every day | ORAL | 1 refills | Status: DC

## 2019-10-30 MED ORDER — SILDENAFIL CITRATE 20 MG PO TABS: ORAL_TABLET | ORAL | 6 refills | Status: DC

## 2019-10-30 MED ORDER — ATORVASTATIN CALCIUM 20 MG PO TABS: 20.0000 mg | ORAL_TABLET | Freq: Every day | ORAL | 1 refills | Status: DC

## 2019-10-30 MED ORDER — SERTRALINE HCL 100 MG PO TABS: 100.0000 mg | ORAL_TABLET | Freq: Every day | ORAL | 1 refills | Status: DC

## 2019-10-30 MED ORDER — NITROGLYCERIN 0.4 MG SL SUBL: SUBLINGUAL_TABLET | SUBLINGUAL | 3 refills | Status: AC

## 2019-10-30 NOTE — Progress Notes (Signed)
   Subjective:    Patient ID: Bruce King, male    DOB: 13-Nov-1970, 49 y.o.   MRN: [id number]  HPI Pt is needing follow up on medications. Pt states he is doing well. No issues. No concerns at this time. Pt is no longer taking Zetia.  The patient does really good job of trying to eat healthy and stay active.  He states he is taking his heart medicines.  But he did stop taking Zetia because he really did not think it was doing much.  Patient still smokes some he has been encouraged to totally quit.  Patient was encouraged to keep regular office visits do a wellness visit in the summer he states his moods are doing good denies being depressed his anxiety is under good control he does utilize Xanax as previously prescribed and refills will be sent Virtual Visit via Video Note  I connected with Bruce King on 10/30/19 at  1:10 PM EST by a video enabled telemedicine application and verified that I am speaking with the correct person using two identifiers.  Location: Patient: home Provider: office   I discussed the limitations of evaluation and management by telemedicine and the availability of in person appointments. The patient expressed understanding and agreed to proceed.  History of Present Illness:    Observations/Objective:   Assessment and Plan:   Follow Up Instructions:    I discussed the assessment and treatment plan with the patient. The patient was provided an opportunity to ask questions and all were answered. The patient agreed with the plan and demonstrated an understanding of the instructions.   The patient was advised to call back or seek an in-person evaluation if the symptoms worsen or if the condition fails to improve as anticipated.  I provided 20 minutes of non-face-to-face time during this encounter.       Review of Systems  Constitutional: Negative for activity change, appetite change and fatigue.  HENT: Negative for congestion and rhinorrhea.     Respiratory: Negative for cough and shortness of breath.   Cardiovascular: Negative for chest pain and leg swelling.  Gastrointestinal: Negative for abdominal pain, nausea and vomiting.  Neurological: Negative for dizziness and headaches.  Psychiatric/Behavioral: Negative for agitation and behavioral problems. The patient is nervous/anxious.        Objective:   Physical Exam  Patient had virtual visit Appears to be in no distress Atraumatic Neuro able to relate and oriented No apparent resp distress Color normal       Assessment & Plan:  1. Coronary artery disease involving native coronary artery of native heart with other form of angina pectoris (HCC) Very important for the patient to continue on with his medications same check lab work - Lipid Profile - Hepatic function panel - Basic Metabolic Panel (BMET) - CBC with Differential  2. Generalized anxiety disorder Anxiety under good control medicine refills given  3. Dyslipidemia Continue cholesterol medicine check lab work - Lipid Profile - Hepatic function panel - Basic Metabolic Panel (BMET) - CBC with Differential  4. Essential hypertension Patient states blood pressure good control wellness exam this summer - Lipid Profile - Hepatic function panel - Basic Metabolic Panel (BMET) - CBC with Differential  5. High risk medication use Screening labs - Lipid Profile - Hepatic function panel - Basic Metabolic Panel (BMET) - CBC with Differential

## 2019-10-31 MED ORDER — ALPRAZOLAM 0.5 MG PO TABS: ORAL_TABLET | ORAL | 5 refills | Status: DC

## 2019-11-26 ENCOUNTER — Other Ambulatory Visit: Payer: Self-pay | Admitting: Family Medicine

## 2019-11-26 ENCOUNTER — Telehealth: Payer: Self-pay | Admitting: Family Medicine

## 2019-11-26 NOTE — Telephone Encounter (Signed)
[facility] calling about xanax prescription saying it was written for 3 times a day but only 30 was sent.  Shouldn't it be 90 pills?

## 2019-11-26 NOTE — Telephone Encounter (Addendum)
Prescription written for one TID PRN #[id number] on 10/31/19 by Dr Lorin Picket

## 2019-12-01 ENCOUNTER — Other Ambulatory Visit: Payer: Self-pay | Admitting: Family Medicine

## 2019-12-01 MED ORDER — ALPRAZOLAM 0.5 MG PO TABS: ORAL_TABLET | ORAL | 5 refills | Status: DC

## 2019-12-01 NOTE — Telephone Encounter (Signed)
Please go ahead and do for 90 tablets with 5 refills please pend

## 2019-12-01 NOTE — Telephone Encounter (Signed)
Fax from [facility] regarding pt Alprazolam 0.5 mg tablet. Take one tablet TID prn. 30 tablets ordered with 5 refills. Hand written note on fax states "MD supposed to resend for 90 tabs". Please advise. Thank you

## 2019-12-01 NOTE — Addendum Note (Signed)
Addended by: Lilyan Punt A on: 12/01/2019 10:12 PM   Modules accepted: Orders

## 2019-12-01 NOTE — Addendum Note (Signed)
Addended by: Marlowe Shores on: 12/01/2019 04:39 PM   Modules accepted: Orders

## 2019-12-10 ENCOUNTER — Other Ambulatory Visit: Payer: Self-pay | Admitting: Interventional Cardiology

## 2019-12-14 IMAGING — DX DG CHEST 2V
2 series · 2 of 2 positions shown · non-contrast
Comparison: 10/30/2016

CLINICAL DATA: Chest pain and shortness of breath.

EXAM:
CHEST - 2 VIEW

[chest pa]
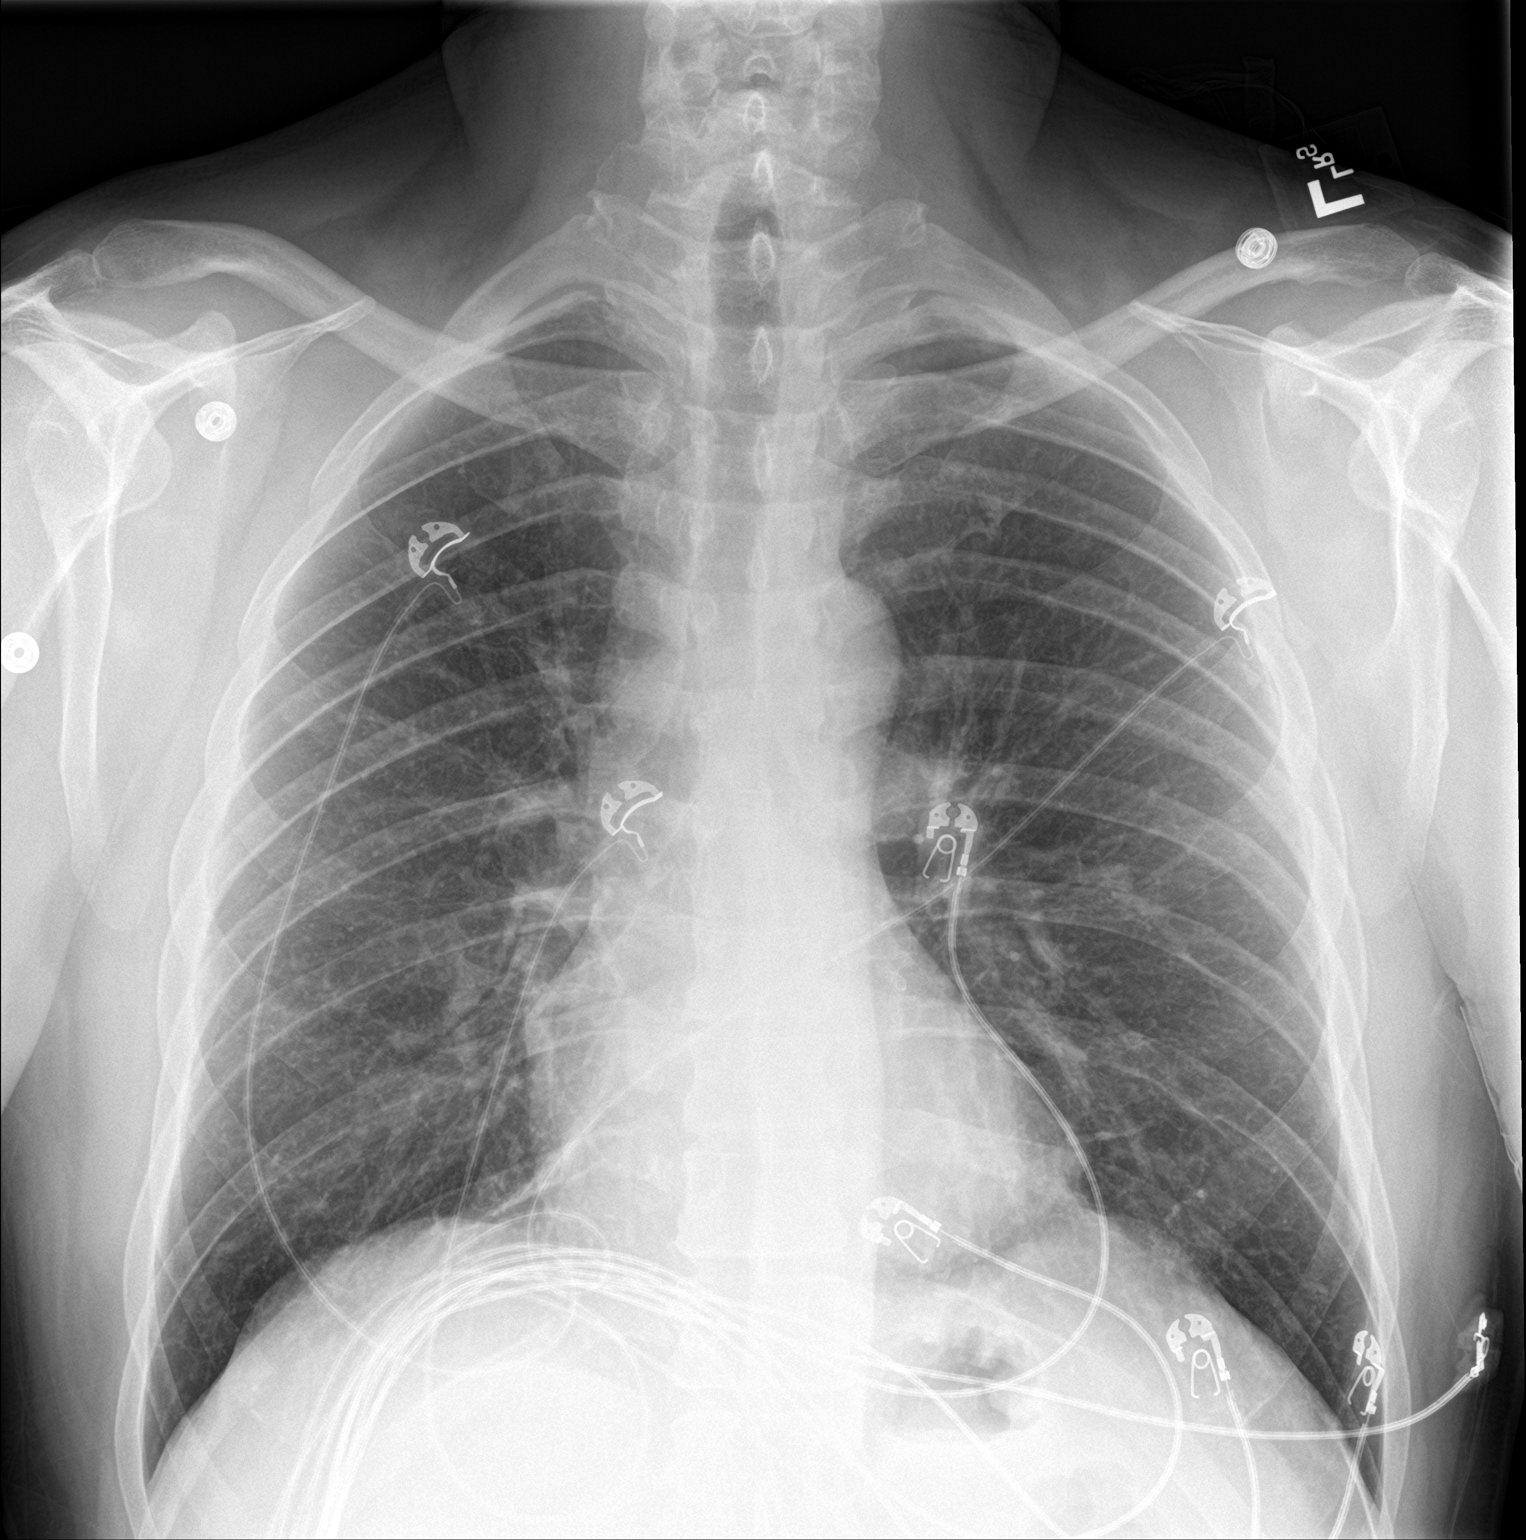

[chest lat]
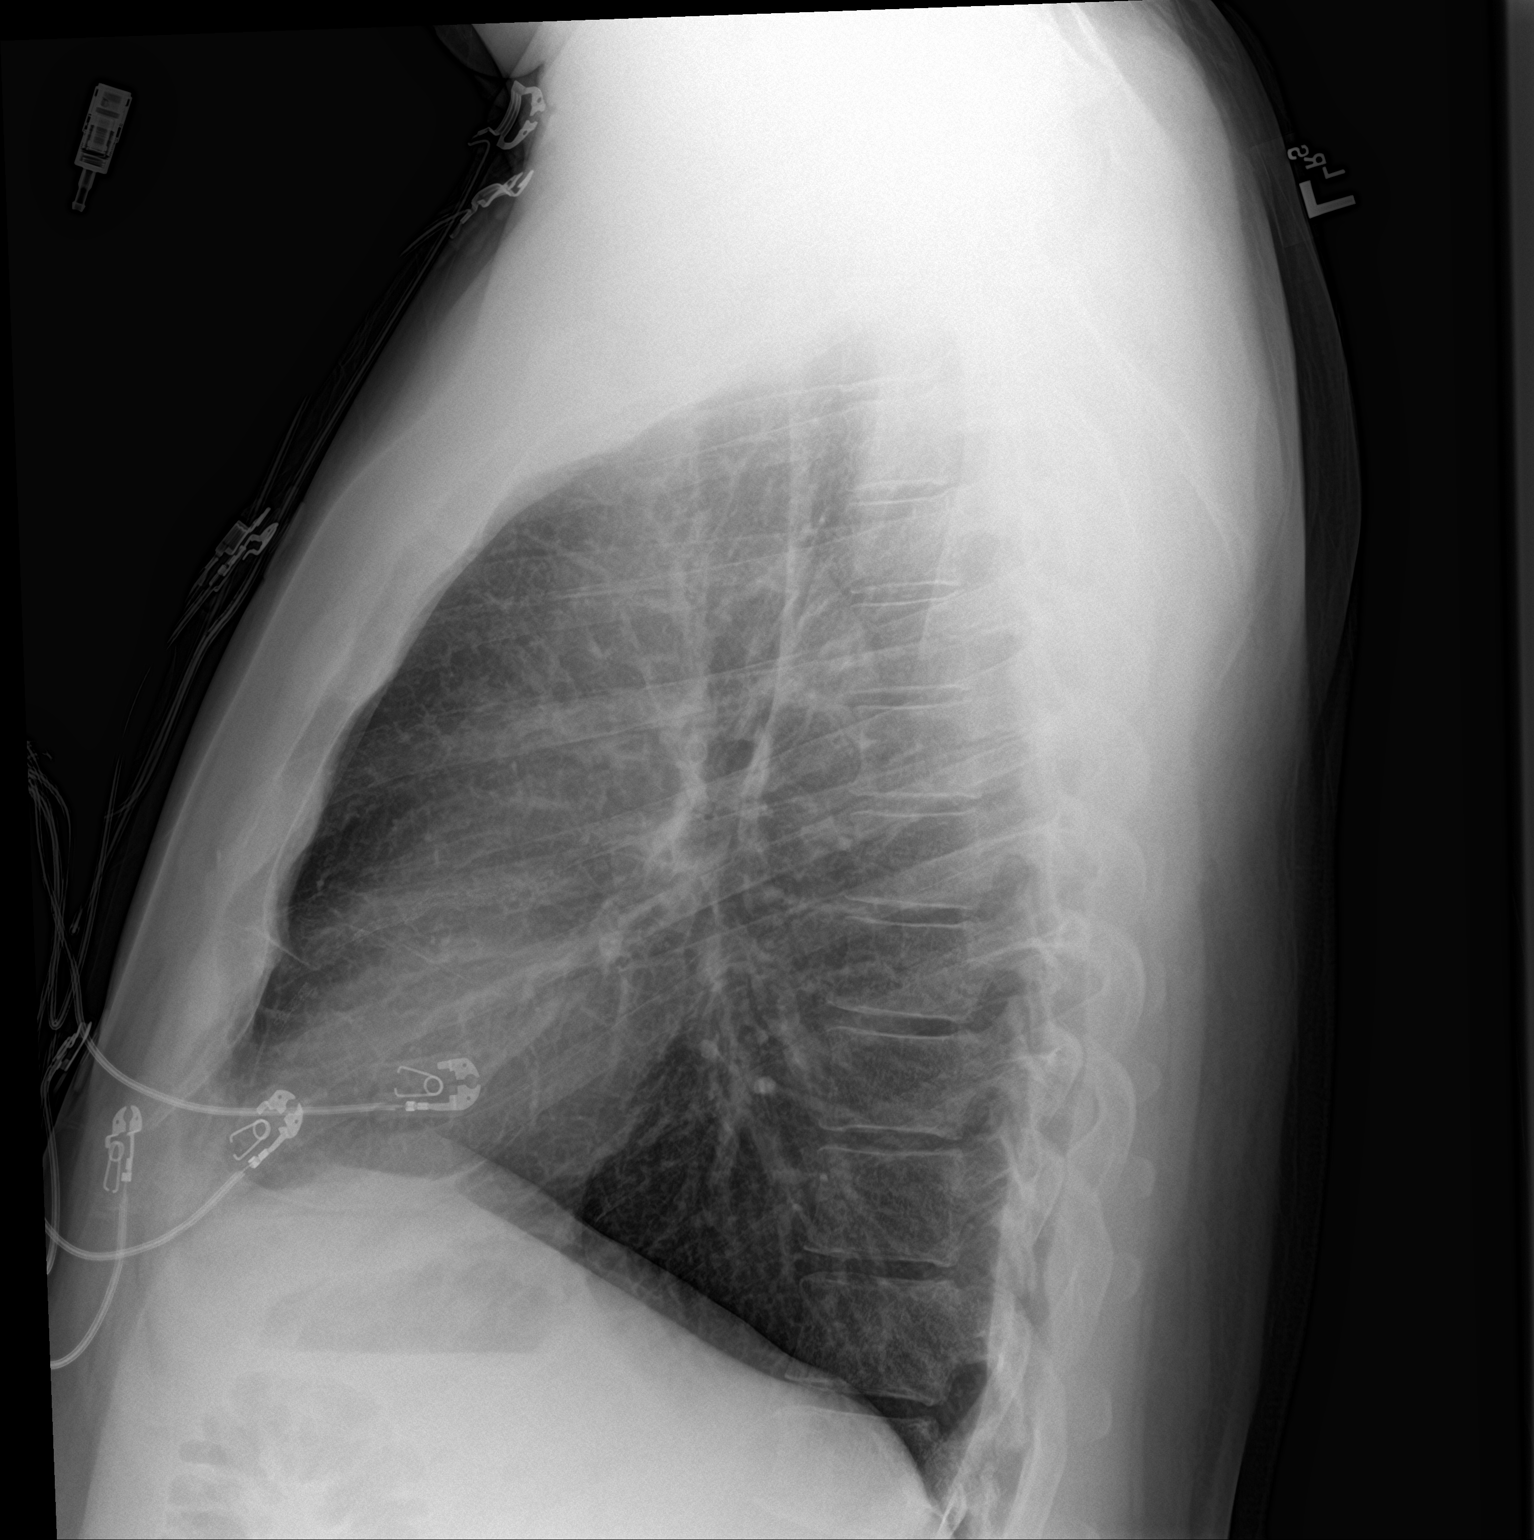

[2 of 2 positions shown; findings below may reference images not displayed]

FINDINGS: The cardiomediastinal contours are normal. Mild linear scarring in
the lingula. Pulmonary vasculature is normal. No consolidation,
pleural effusion, or pneumothorax. No acute osseous abnormalities
are seen.
IMPRESSION: No acute chest findings.

## 2020-01-05 NOTE — Progress Notes (Signed)
Cardiology Office Note   Date:  01/07/2020   ID:  Bruce King, DOB 1971/05/24, MRN [id number]  PCP:  Babs Sciara, MD    No chief complaint on file.  CAD  Wt Readings from Last 3 Encounters:  01/07/20 223 lb 1.9 oz (101.2 kg)  01/08/19 230 lb (104.3 kg)  10/08/18 233 lb 6.4 oz (105.9 kg)       History of Present Illness: Bruce King is a 49 y.o. male  with a history of CAD s/p DES to LAD x32/2018, hypertension, hyperlipidemia, anxiety, and ongoing tobacco usepresents for hospital follow up  Admitted 09/29/18 for unstable angina. Troponin remained negative. Cath as below: SUMMARY  Widely patent stents in LAD with mild disease elsewhere. Improved flow through jailed diagonal branches.  Moderate disease in mCx (~60% - FFR 0.84)  Moderate disease in small caliber 2nd Diag prior to Bifurcation - not PCI target  Normal LVEDP  Recommended: aggressive medical therapy.  He still works at the [employer].  THey have hand sanitizer and using a mask.  Since his cath in Jan 2020, he has felt well.  BP was high at that time.  SInce then, BP has been controlled. Losartan was increased.   He continues to walk regularly.  He still has trouble losing weight.    The patient does not have symptoms concerning for COVID-19 infection (fever, chills, cough, or new shortness of breath).   Still smoking.  He has cut back, but has not been able to stop.    Past Medical History:  Diagnosis Date  . Anxiety   . Chest pain 09/2018  . Coronary artery disease   . Depression   . GAD (generalized anxiety disorder)   . GERD (gastroesophageal reflux disease)   . History of hiatal hernia   . Hyperlipidemia   . NSTEMI (non-ST elevated myocardial infarction) (HCC) 10/30/2016    Past Surgical History:  Procedure Laterality Date  . CORONARY ANGIOPLASTY WITH STENT PLACEMENT  10/31/2016   "3 stents"  . CORONARY STENT INTERVENTION N/A 10/31/2016   Procedure: Coronary  Stent Intervention;  Surgeon: Iran Ouch, MD;  Location: [facility] INVASIVE CV LAB;  Service: Cardiovascular;  Laterality: N/A;  . FRACTURE SURGERY    . INTRAVASCULAR PRESSURE WIRE/FFR STUDY N/A 09/30/2018   Procedure: INTRAVASCULAR PRESSURE WIRE/FFR STUDY;  Surgeon: Marykay Lex, MD;  Location: [facility] INVASIVE CV LAB;  Service: Cardiovascular;  Laterality: N/A;  . LEFT HEART CATH AND CORONARY ANGIOGRAPHY N/A 10/31/2016   Procedure: Left Heart Cath and Coronary Angiography;  Surgeon: Iran Ouch, MD;  Location: [facility] INVASIVE CV LAB;  Service: Cardiovascular;  Laterality: N/A;  . LEFT HEART CATH AND CORONARY ANGIOGRAPHY N/A 09/30/2018   Procedure: LEFT HEART CATH AND CORONARY ANGIOGRAPHY;  Surgeon: Marykay Lex, MD;  Location: [facility] INVASIVE CV LAB;  Service: Cardiovascular;  Laterality: N/A;  . ORIF TIBIA & FIBULA FRACTURES Right ~ 05/1996   "titanium rod"     Current Outpatient Medications  Medication Sig Dispense Refill  . acetaminophen (TYLENOL) 500 MG tablet Take 1,000 mg by mouth every 6 (six) hours as needed.    . ALPRAZolam (XANAX) 0.5 MG tablet TAKE (1) TABLET THREE TIMES DAILY AS NEEDED. 90 tablet 5  . atorvastatin (LIPITOR) 20 MG tablet Take 1 tablet (20 mg total) by mouth at bedtime. 90 tablet 1  . clopidogrel (PLAVIX) 75 MG tablet Take 1 tablet (75 mg total) by mouth daily. 90 tablet 3  . losartan (  COZAAR) 50 MG tablet Take 1 tablet (50 mg total) by mouth daily. 90 tablet 1  . metoprolol tartrate (LOPRESSOR) 50 MG tablet Take 1 tablet (50 mg total) by mouth 2 (two) times daily. Please make yearly appt with Dr. Irish Lack for April for future refills. 1st attempt 60 tablet 0  . nitroGLYCERIN (NITROSTAT) 0.4 MG SL tablet 1 every 5 minutes SL as needed chest discomfort maximum 3 call 911 if ongoing pain 25 tablet 3  . sertraline (ZOLOFT) 100 MG tablet Take 1 tablet (100 mg total) by mouth daily. 90 tablet 1  . sildenafil (REVATIO) 20 MG tablet 3 to 5 before relations 50 tablet 6   No  current facility-administered medications for this visit.    Allergies:   Bactrim [sulfamethoxazole-trimethoprim], Lipitor [atorvastatin], and Pravastatin    Social History:  The patient  reports that he has been smoking cigarettes. He started smoking about 30 years ago. He has a 25.00 pack-year smoking history. He has quit using smokeless tobacco.  His smokeless tobacco use included chew and snuff. He reports current alcohol use of about 14.0 standard drinks of alcohol per week. He reports that he does not use drugs.   Family History:  The patient's family history includes Heart attack in his father; Hypertension in his mother.    ROS:  Please see the history of present illness.   Otherwise, review of systems are positive for can't stopped smoking.   All other systems are reviewed and negative.    PHYSICAL EXAM: VS:  BP 124/90   Pulse 65   Ht 6\' 1"  (1.854 m)   Wt 223 lb 1.9 oz (101.2 kg)   SpO2 98%   BMI 29.44 kg/m  , BMI Body mass index is 29.44 kg/m. GEN: Well nourished, well developed, in no acute distress  HEENT: normal  Neck: no JVD, carotid bruits, or masses Cardiac: RRR; no murmurs, rubs, or gallops,no edema  Respiratory:  clear to auscultation bilaterally, normal work of breathing GI: soft, nontender, nondistended, + BS MS: no deformity or atrophy  Skin: warm and dry, no rash Neuro:  Strength and sensation are intact Psych: euthymic mood, full affect   EKG:   The ekg ordered today demonstrates NSR, no ST changes   Recent Labs: 01/05/2020: ALT 34; BUN 10; Creatinine, Ser 1.31; Hemoglobin 17.1; Platelets 325; Potassium 4.9; Sodium 141   Lipid Panel    Component Value Date/Time   CHOL 156 01/05/2020 0853   TRIG 362 (H) 01/05/2020 0853   HDL 22 (L) 01/05/2020 0853   CHOLHDL 7.1 (H) 01/05/2020 0853   CHOLHDL 4.6 09/30/2018 0413   VLDL 39 09/30/2018 0413   LDLCALC 76 01/05/2020 0853     Other studies Reviewed: Additional studies/ records that were reviewed  today with results demonstrating: PMD labs reviewed.   ASSESSMENT AND PLAN:  1.   CAD: Continue aggressive secondary prevention. Decrease sugar intake and processed foods. Decrease carbs.   2.   Hyperlipidemia: Increased TG since Jan 2020.  Exercise decreased.  Will check with PharmD re: Dalene Seltzer.  No pains with atorvastatin at current dose, so this could be increased.  3.   HTN: The current medical regimen is effective;  continue present plan and medications.  Lowers losartan dose on occasion when lower BP reading. Tobacco abuse: He is working on stopping.  Not interested in pills or patches.     Current medicines are reviewed at length with the patient today.  The patient concerns regarding his medicines were  addressed.  The following changes have been made:  No change  Labs/ tests ordered today include:  No orders of the defined types were placed in this encounter.   Recommend 150 minutes/week of aerobic exercise Low fat, low carb, high fiber diet recommended  Disposition:   FU in 1 year   Signed, Lance Muss, MD  01/07/2020 9:42 AM    [facility] [address] [place], [place], [place]  [address] Phone: [phone]; Fax: [phone]

## 2020-01-06 LAB — BASIC METABOLIC PANEL
BUN/Creatinine Ratio: 8 — ABNORMAL LOW (ref 9–20)
BUN: 10 mg/dL (ref 6–24)
CO2: 25 mmol/L (ref 20–29)
Calcium: 10.2 mg/dL (ref 8.7–10.2)
Chloride: 102 mmol/L (ref 96–106)
Creatinine, Ser: 1.31 mg/dL — ABNORMAL HIGH (ref 0.76–1.27)
GFR calc Af Amer: 74 mL/min/{1.73_m2} (ref >59)
GFR calc non Af Amer: 64 mL/min/{1.73_m2} (ref >59)
Glucose: 97 mg/dL (ref 65–99)
Potassium: 4.9 mmol/L (ref 3.5–5.2)
Sodium: 141 mmol/L (ref 134–144)

## 2020-01-06 LAB — CBC WITH DIFFERENTIAL/PLATELET
Basophils Absolute: 0.1 10*3/uL (ref 0.0–0.2)
Basos: 1 %
EOS (ABSOLUTE): 0.3 10*3/uL (ref 0.0–0.4)
Eos: 3 %
Hematocrit: 48.5 % (ref 37.5–51.0)
Hemoglobin: 17.1 g/dL (ref 13.0–17.7)
Immature Grans (Abs): 0 10*3/uL (ref 0.0–0.1)
Immature Granulocytes: 0 %
Lymphocytes Absolute: 3 10*3/uL (ref 0.7–3.1)
Lymphs: 28 %
MCH: 32 pg (ref 26.6–33.0)
MCHC: 35.3 g/dL (ref 31.5–35.7)
MCV: 91 fL (ref 79–97)
Monocytes Absolute: 0.7 10*3/uL (ref 0.1–0.9)
Monocytes: 7 %
Neutrophils Absolute: 6.5 10*3/uL (ref 1.4–7.0)
Neutrophils: 61 %
Platelets: 325 10*3/uL (ref 150–450)
RBC: 5.34 x10E6/uL (ref 4.14–5.80)
RDW: 13.1 % (ref 11.6–15.4)
WBC: 10.7 10*3/uL (ref 3.4–10.8)

## 2020-01-06 LAB — HEPATIC FUNCTION PANEL
ALT: 34 IU/L (ref 0–44)
AST: 27 IU/L (ref 0–40)
Albumin: 4.4 g/dL (ref 4.0–5.0)
Alkaline Phosphatase: 127 IU/L — ABNORMAL HIGH (ref 39–117)
Bilirubin Total: 0.4 mg/dL (ref 0.0–1.2)
Bilirubin, Direct: 0.11 mg/dL (ref 0.00–0.40)
Total Protein: 7.4 g/dL (ref 6.0–8.5)

## 2020-01-06 LAB — LIPID PANEL
Chol/HDL Ratio: 7.1 ratio — ABNORMAL HIGH (ref 0.0–5.0)
Cholesterol, Total: 156 mg/dL (ref 100–199)
HDL: 22 mg/dL — ABNORMAL LOW (ref >39)
LDL Chol Calc (NIH): 76 mg/dL (ref 0–99)
Triglycerides: 362 mg/dL — ABNORMAL HIGH (ref 0–149)
VLDL Cholesterol Cal: 58 mg/dL — ABNORMAL HIGH (ref 5–40)

## 2020-01-07 ENCOUNTER — Encounter: Payer: Self-pay | Admitting: Interventional Cardiology

## 2020-01-07 ENCOUNTER — Other Ambulatory Visit: Payer: Self-pay

## 2020-01-07 ENCOUNTER — Ambulatory Visit (INDEPENDENT_AMBULATORY_CARE_PROVIDER_SITE_OTHER): Payer: Self-pay | Admitting: Interventional Cardiology

## 2020-01-07 VITALS — BP 124/90 | HR 65 | Ht 73.0 in | Wt 223.1 lb

## 2020-01-07 DIAGNOSIS — Z720 Tobacco use: Secondary | ICD-10-CM

## 2020-01-07 DIAGNOSIS — I252 Old myocardial infarction: Secondary | ICD-10-CM

## 2020-01-07 DIAGNOSIS — Z955 Presence of coronary angioplasty implant and graft: Secondary | ICD-10-CM

## 2020-01-07 DIAGNOSIS — E782 Mixed hyperlipidemia: Secondary | ICD-10-CM

## 2020-01-07 DIAGNOSIS — I25118 Atherosclerotic heart disease of native coronary artery with other forms of angina pectoris: Secondary | ICD-10-CM

## 2020-01-07 DIAGNOSIS — I10 Essential (primary) hypertension: Secondary | ICD-10-CM

## 2020-01-07 NOTE — Patient Instructions (Signed)

## 2020-01-08 ENCOUNTER — Other Ambulatory Visit: Payer: Self-pay | Admitting: Interventional Cardiology

## 2020-01-08 ENCOUNTER — Telehealth: Payer: Self-pay

## 2020-01-08 DIAGNOSIS — E782 Mixed hyperlipidemia: Secondary | ICD-10-CM

## 2020-01-08 MED ORDER — ROSUVASTATIN CALCIUM 20 MG PO TABS: 20.0000 mg | ORAL_TABLET | Freq: Every day | ORAL | 3 refills | Status: DC

## 2020-01-08 MED ORDER — ICOSAPENT ETHYL 1 G PO CAPS: 2.0000 g | ORAL_CAPSULE | Freq: Two times a day (BID) | ORAL | 3 refills | Status: DC

## 2020-01-08 NOTE — Telephone Encounter (Signed)
-----   Message from Olene Floss, [facility] sent at 01/07/2020  1:13 PM EDT ----- In that case, I would suggest trying to switch him to rosuvastatin 20mg . It may need a prior auth. I don't see his insurance info on file. You can just send an Rx to pharmacy. Ill call later to see if it needs a prior auth. If it does, I can do the paperwork.  Melissa ----- Message ----- From: , MD Sent: 01/07/2020  12:46 PM EDT To: 01/09/2020, RPH, Olene Floss, RN  He had prior myalgias with atorvastatin, but sx better now.  Hesitant to go to 80 mg, but will go to atorvastatin  40 mg daily and start Vascepa  2 grams BID.  Does he need any special form filled out for insurance purposes?  JV    ----- Message ----- From: Lattie Haw, [facility] Sent: 01/07/2020  11:50 AM EDT To: 01/09/2020, MD, Corky Crafts, RN  You should probably do both, but the more important out of the two I think would be to increase atorvastatin. He really should be on high intensity statin (I would increase to atorvastatin 80) due to his stent and his LDL should be <70.  Would be reasonable to allow him 3 months for lifestyle. But if you arent convinced he will do it, then I would increase atorvastatin and start vascepa  Melissa ----- Message ----- From: Lattie Haw, MD Sent: 01/07/2020  10:43 AM EDT To: 01/09/2020, RPH  Increased TG for this patient.... should we increase atorvastatin or try Vascepa.  He does have a stent. THanks.

## 2020-01-08 NOTE — Telephone Encounter (Signed)
Patient advised on medications changes. Will stop taking rosuvastatin and start on atorvastatin and vascepa per Dr. Eldridge Dace and will have repeat lab work in 3 months.

## 2020-01-08 NOTE — Telephone Encounter (Signed)
Patient returning call.

## 2020-01-08 NOTE — Telephone Encounter (Signed)
Left message for patient to call back.  Per Dr. Eldridge Dace, patient is to stop atorvastatin, start rosuvastatin 20 mg QD , and start vascepa 2 g BID. Will need repeat labs in 3 months.

## 2020-01-11 ENCOUNTER — Telehealth: Payer: Self-pay | Admitting: Pharmacist

## 2020-01-11 NOTE — Telephone Encounter (Signed)
Called patient's pharmacy. They state they have no insurance on file. Called patient to see if he had Rx insurance or if he pays cash for medications. Icosapent Ethyl has a good Rx coupon for about $90-100. This might still be cost prohibitive. Left message for patient to call back.

## 2020-01-12 NOTE — Telephone Encounter (Signed)
I would add fenofibrate to rosuvastatin,  if this will be affordable.  JV

## 2020-01-12 NOTE — Telephone Encounter (Signed)
Fenofibrate 145 (tricor) is available at [facility] for $9/30 day supply. I called [facility], his normal pharmacy- they said that they could fill the 160mg  for about $10 Called patient to see if he would like Rx sent to [facility] (145mg ) or to [facility] pharmacy (160mg ) Left VM for him to call back

## 2020-01-12 NOTE — Telephone Encounter (Signed)
Spoke with patient. He does not have insurance. Even the generic with good rx coupon is $100/month. This is not affordable. Might consider continuing with rosuvastatin 20mg  and encourage diet low in carbs. Could consider adding fenofibrate. Maybe if repeat labs are still high. Will defer to Dr. . Advised could call patient back after I speak with Dr. Eldridge Dace.

## 2020-01-13 MED ORDER — FENOFIBRATE 160 MG PO TABS: 160.0000 mg | ORAL_TABLET | Freq: Every day | ORAL | 11 refills | Status: DC

## 2020-01-13 NOTE — Addendum Note (Signed)
Addended by: Jaryiah Mehlman E on: 01/13/2020 03:13 PM   Modules accepted: Orders

## 2020-01-13 NOTE — Telephone Encounter (Signed)
Pt returned call, states he only deals with [facility] pharmacy. He is ok with ~$10 copay for fenofibrate 160mg . I have sent rx to his pharmacy.

## 2020-01-23 ENCOUNTER — Other Ambulatory Visit: Payer: Self-pay | Admitting: Interventional Cardiology

## 2020-03-09 ENCOUNTER — Other Ambulatory Visit: Payer: Self-pay

## 2020-05-31 ENCOUNTER — Other Ambulatory Visit: Payer: Self-pay | Admitting: Family Medicine

## 2020-06-01 NOTE — Telephone Encounter (Signed)
Pt has scheduled on 06/23/20. Please advise. Thank you

## 2020-06-01 NOTE — Telephone Encounter (Signed)
Scheduled 9/30

## 2020-06-23 ENCOUNTER — Ambulatory Visit (INDEPENDENT_AMBULATORY_CARE_PROVIDER_SITE_OTHER): Payer: Self-pay | Admitting: Family Medicine

## 2020-06-23 ENCOUNTER — Other Ambulatory Visit: Payer: Self-pay

## 2020-06-23 VITALS — BP 132/80 | HR 62 | Temp 97.9°F | Wt 221.0 lb

## 2020-06-23 DIAGNOSIS — I10 Essential (primary) hypertension: Secondary | ICD-10-CM

## 2020-06-23 DIAGNOSIS — E782 Mixed hyperlipidemia: Secondary | ICD-10-CM

## 2020-06-23 DIAGNOSIS — Z79899 Other long term (current) drug therapy: Secondary | ICD-10-CM

## 2020-06-23 DIAGNOSIS — Z23 Encounter for immunization: Secondary | ICD-10-CM

## 2020-06-23 DIAGNOSIS — F411 Generalized anxiety disorder: Secondary | ICD-10-CM

## 2020-06-23 DIAGNOSIS — H938X2 Other specified disorders of left ear: Secondary | ICD-10-CM

## 2020-06-23 MED ORDER — ALPRAZOLAM 0.5 MG PO TABS
ORAL_TABLET | ORAL | 5 refills | Status: DC
Start: 2020-06-23 — End: 2020-12-21

## 2020-06-23 MED ORDER — SERTRALINE HCL 100 MG PO TABS: 100.0000 mg | ORAL_TABLET | Freq: Every day | ORAL | 1 refills | Status: DC

## 2020-06-23 MED ORDER — SILDENAFIL CITRATE 20 MG PO TABS: ORAL_TABLET | ORAL | 6 refills | Status: DC

## 2020-06-23 MED ORDER — LOSARTAN POTASSIUM 50 MG PO TABS
50.0000 mg | ORAL_TABLET | Freq: Every day | ORAL | 1 refills | Status: DC
Start: 2020-06-23 — End: 2020-12-21

## 2020-06-23 NOTE — Patient Instructions (Addendum)
WALKING  Walking is a great form of exercise to increase your strength, endurance and overall fitness.  A walking program can help you start slowly and gradually build endurance as you go.  Everyone's ability is different, so each person's starting point will be different.  You do not have to follow them exactly.  The are just samples. You should simply find out what's right for you and stick to that program.   In the beginning, you'll start off walking 2-3 times a day for short distances.  As you get stronger, you'll be walking further at just 1-2 times per day.  A. You Can Walk For A Certain Length Of Time Each Day    Walk 5 minutes 3 times per day.  Increase 2 minutes every 2 days (3 times per day).  Work up to 25-30 minutes (1-2 times per day).   Example:   Day 1-2 5 minutes 3 times per day   Day 7-8 12 minutes 2-3 times per day   Day 13-14 25 minutes 1-2 times per day  B. You Can Walk For a Certain Distance Each Day     Distance can be substituted for time.    Example:   3 trips to mailbox (at road)   3 trips to corner of block   3 trips around the block  C. Go to local [employer] and use the track.    Walk for distance 2 to 6 laps around track  Or time 30 minutes  D. Walk ok  Please only do the exercises that your therapist has initialed and datedDASH Eating Plan DASH stands for "Dietary Approaches to Stop Hypertension." The DASH eating plan is a healthy eating plan that has been shown to reduce high blood pressure (hypertension). It may also reduce your risk for type 2 diabetes, heart disease, and stroke. The DASH eating plan may also help with weight loss. What are tips for following this plan?  General guidelines  Avoid eating more than 2,300 mg (milligrams) of salt (sodium) a day. If you have hypertension, you may need to reduce your sodium intake to 1,500 mg a day.  Limit alcohol intake to no more than 1 drink a day for nonpregnant women and 2 drinks a day for men.  One drink equals 12 oz of beer, 5 oz of wine, or 1 oz of hard liquor.  Work with your health care provider to maintain a healthy body weight or to lose weight. Ask what an ideal weight is for you.  Get at least 30 minutes of exercise that causes your heart to beat faster (aerobic exercise) most days of the week. Activities may include walking, swimming, or biking.  Work with your health care provider or diet and nutrition specialist (dietitian) to adjust your eating plan to your individual calorie needs. Reading food labels   Check food labels for the amount of sodium per serving. Choose foods with less than 5 percent of the Daily Value of sodium. Generally, foods with less than 300 mg of sodium per serving fit into this eating plan.  To find whole grains, look for the word "whole" as the first word in the ingredient list. Shopping  Buy products labeled as "low-sodium" or "no salt added."  Buy fresh foods. Avoid canned foods and premade or frozen meals. Cooking  Avoid adding salt when cooking. Use salt-free seasonings or herbs instead of table salt or sea salt. Check with your health care provider or pharmacist before using salt  substitutes.  Do not fry foods. Cook foods using healthy methods such as baking, boiling, grilling, and broiling instead.  Cook with heart-healthy oils, such as olive, canola, soybean, or sunflower oil. Meal planning  Eat a balanced diet that includes: ? 5 or more servings of fruits and vegetables each day. At each meal, try to fill half of your plate with fruits and vegetables. ? Up to 6-8 servings of whole grains each day. ? Less than 6 oz of lean meat, poultry, or fish each day. A 3-oz serving of meat is about the same size as a deck of cards. One egg equals 1 oz. ? 2 servings of low-fat dairy each day. ? A serving of nuts, seeds, or beans 5 times each week. ? Heart-healthy fats. Healthy fats called Omega-3 fatty acids are found in foods such as flaxseeds  and coldwater fish, like sardines, salmon, and mackerel.  Limit how much you eat of the following: ? Canned or prepackaged foods. ? Food that is high in trans fat, such as fried foods. ? Food that is high in saturated fat, such as fatty meat. ? Sweets, desserts, sugary drinks, and other foods with added sugar. ? Full-fat dairy products.  Do not salt foods before eating.  Try to eat at least 2 vegetarian meals each week.  Eat more home-cooked food and less restaurant, buffet, and fast food.  When eating at a restaurant, ask that your food be prepared with less salt or no salt, if possible. What foods are recommended? The items listed may not be a complete list. Talk with your dietitian about what dietary choices are best for you. Grains Whole-grain or whole-wheat bread. Whole-grain or whole-wheat pasta. Brown rice. Orpah Cobb. Bulgur. Whole-grain and low-sodium cereals. Pita bread. Low-fat, low-sodium crackers. Whole-wheat flour tortillas. Vegetables Fresh or frozen vegetables (raw, steamed, roasted, or grilled). Low-sodium or reduced-sodium tomato and vegetable juice. Low-sodium or reduced-sodium tomato sauce and tomato paste. Low-sodium or reduced-sodium canned vegetables. Fruits All fresh, dried, or frozen fruit. Canned fruit in natural juice (without added sugar). Meat and other protein foods Skinless chicken or [place]. Ground chicken or [place]. Pork with fat trimmed off. Fish and seafood. Egg whites. Dried beans, peas, or lentils. Unsalted nuts, nut butters, and seeds. Unsalted canned beans. Lean cuts of beef with fat trimmed off. Low-sodium, lean deli meat. Dairy Low-fat (1%) or fat-free (skim) milk. Fat-free, low-fat, or reduced-fat cheeses. Nonfat, low-sodium ricotta or cottage cheese. Low-fat or nonfat yogurt. Low-fat, low-sodium cheese. Fats and oils Soft margarine without trans fats. Vegetable oil. Low-fat, reduced-fat, or light mayonnaise and salad dressings  (reduced-sodium). Canola, safflower, olive, soybean, and sunflower oils. Avocado. Seasoning and other foods Herbs. Spices. Seasoning mixes without salt. Unsalted popcorn and pretzels. Fat-free sweets. What foods are not recommended? The items listed may not be a complete list. Talk with your dietitian about what dietary choices are best for you. Grains Baked goods made with fat, such as croissants, muffins, or some breads. Dry pasta or rice meal packs. Vegetables Creamed or fried vegetables. Vegetables in a cheese sauce. Regular canned vegetables (not low-sodium or reduced-sodium). Regular canned tomato sauce and paste (not low-sodium or reduced-sodium). Regular tomato and vegetable juice (not low-sodium or reduced-sodium). Rosita Fire. Olives. Fruits Canned fruit in a light or heavy syrup. Fried fruit. Fruit in cream or butter sauce. Meat and other protein foods Fatty cuts of meat. Ribs. Fried meat. Tomasa Blase. Sausage. Bologna and other processed lunch meats. Salami. Fatback. Hotdogs. Bratwurst. Salted nuts and seeds. Canned  beans with added salt. Canned or smoked fish. Whole eggs or egg yolks. Chicken or [place] with skin. Dairy Whole or 2% milk, cream, and half-and-half. Whole or full-fat cream cheese. Whole-fat or sweetened yogurt. Full-fat cheese. Nondairy creamers. Whipped toppings. Processed cheese and cheese spreads. Fats and oils Butter. Stick margarine. Lard. Shortening. Ghee. Bacon fat. Tropical oils, such as coconut, palm kernel, or palm oil. Seasoning and other foods Salted popcorn and pretzels. Onion salt, garlic salt, seasoned salt, table salt, and sea salt. Worcestershire sauce. Tartar sauce. Barbecue sauce. Teriyaki sauce. Soy sauce, including reduced-sodium. Steak sauce. Canned and packaged gravies. Fish sauce. Oyster sauce. Cocktail sauce. Horseradish that you find on the shelf. Ketchup. Mustard. Meat flavorings and tenderizers. Bouillon cubes. Hot sauce and Tabasco sauce. Premade or  packaged marinades. Premade or packaged taco seasonings. Relishes. Regular salad dressings. Where to find more information:  National Heart, Lung, and Blood Institute: PopSteam.is  American Heart Association: www.heart.org Summary  The DASH eating plan is a healthy eating plan that has been shown to reduce high blood pressure (hypertension). It may also reduce your risk for type 2 diabetes, heart disease, and stroke.  With the DASH eating plan, you should limit salt (sodium) intake to 2,300 mg a day. If you have hypertension, you may need to reduce your sodium intake to 1,500 mg a day.  When on the DASH eating plan, aim to eat more fresh fruits and vegetables, whole grains, lean proteins, low-fat dairy, and heart-healthy fats.  Work with your health care provider or diet and nutrition specialist (dietitian) to adjust your eating plan to your individual calorie needs. This information is not intended to replace advice given to you by your health care provider. Make sure you discuss any questions you have with your health care provider. Document Revised: 08/23/2017 Document Reviewed: 09/03/2016 Elsevier Patient Education  2020 [employer].

## 2020-06-23 NOTE — Progress Notes (Signed)
° °  Subjective:    Patient ID: Bruce King, male    DOB: [birth year], 49 y.o.   MRN: [id number]  HPImed check up.  Patient relates he is doing well with his medication uses Xanax because of chronic anxiety. Does take his sertraline on a regular basis. Finds he does not worry as much as he used to. No panic attacks recently  Blood pressure takes his medicine regular basis denies any chest tightness pressure pain Patient is trying to minimize bad foods He is trying to quit smoking he has been counseled to quit smoking  Left ear stopped up.   Pt stopped taking fenofibrate due to it causing stomach issues.     Review of Systems  Constitutional: Negative for activity change, fatigue and fever.  HENT: Negative for congestion and rhinorrhea.   Respiratory: Negative for cough and shortness of breath.   Cardiovascular: Negative for chest pain and leg swelling.  Gastrointestinal: Negative for abdominal pain, diarrhea and nausea.  Genitourinary: Negative for dysuria and hematuria.  Neurological: Negative for weakness and headaches.  Psychiatric/Behavioral: Negative for agitation and behavioral problems.       Objective:   Physical Exam Vitals reviewed.  Constitutional:      General: He is not in acute distress. HENT:     Head: Normocephalic and atraumatic.  Eyes:     General:        Right eye: No discharge.        Left eye: No discharge.  Neck:     Trachea: No tracheal deviation.  Cardiovascular:     Rate and Rhythm: Normal rate and regular rhythm.     Heart sounds: Normal heart sounds. No murmur heard.   Pulmonary:     Effort: Pulmonary effort is normal. No respiratory distress.     Breath sounds: Normal breath sounds.  Lymphadenopathy:     Cervical: No cervical adenopathy.  Skin:    General: Skin is warm and dry.  Neurological:     Mental Status: He is alert.     Coordination: Coordination normal.  Psychiatric:        Behavior: Behavior normal.             Assessment & Plan:  1. Generalized anxiety disorder Continue Zoloft on a regular basis. In addition to this may use Xanax. He has been on this for years. Does not abuse it drug registry was checked.  2. Essential hypertension Blood pressure decent control check metabolic 7 continue healthy eating and regular physical activity - Basic metabolic panel Patient was counseled to quit smoking 3. Mixed hyperlipidemia Watch diet closely watch fats in the diet stay active check lipid panel continue statin - Lipid panel  4. Sensation of fullness in left ear Eardrum is normal if this does not get better over the next month notify us we will set up with ENT  5. High risk medication use See above - Hepatic function panel  6. Need for vaccination Flu shot today - Flu Vaccine QUAD 6+ mos PF IM (Fluarix Quad PF) Patient to follow-up in 6 months sooner problems

## 2020-12-21 ENCOUNTER — Other Ambulatory Visit: Payer: Self-pay

## 2020-12-21 ENCOUNTER — Encounter: Payer: Self-pay | Admitting: Family Medicine

## 2020-12-21 ENCOUNTER — Ambulatory Visit (INDEPENDENT_AMBULATORY_CARE_PROVIDER_SITE_OTHER): Payer: Self-pay | Admitting: Family Medicine

## 2020-12-21 VITALS — BP 118/76 | HR 67 | Temp 98.0°F | Wt 208.8 lb

## 2020-12-21 DIAGNOSIS — E782 Mixed hyperlipidemia: Secondary | ICD-10-CM

## 2020-12-21 DIAGNOSIS — I10 Essential (primary) hypertension: Secondary | ICD-10-CM

## 2020-12-21 DIAGNOSIS — F411 Generalized anxiety disorder: Secondary | ICD-10-CM

## 2020-12-21 DIAGNOSIS — Z720 Tobacco use: Secondary | ICD-10-CM

## 2020-12-21 MED ORDER — SERTRALINE HCL 100 MG PO TABS
100.0000 mg | ORAL_TABLET | Freq: Every day | ORAL | 1 refills | Status: DC
Start: 2020-12-21 — End: 2021-07-07

## 2020-12-21 MED ORDER — ALPRAZOLAM 0.5 MG PO TABS
ORAL_TABLET | ORAL | 1 refills | Status: DC
Start: 2020-12-21 — End: 2021-02-27

## 2020-12-21 MED ORDER — LOSARTAN POTASSIUM 50 MG PO TABS
50.0000 mg | ORAL_TABLET | Freq: Every day | ORAL | 1 refills | Status: DC
Start: 2020-12-21 — End: 2021-06-27

## 2020-12-21 NOTE — Patient Instructions (Signed)
Steps to Quit Smoking Smoking tobacco is the leading cause of preventable death. It can affect almost every organ in the body. Smoking puts you and people around you at risk for many serious, long-lasting (chronic) diseases. Quitting smoking can be hard, but it is one of the best things that you can do for your health. It is never too late to quit. How do I get ready to quit? When you decide to quit smoking, make a plan to help you succeed. Before you quit:  Pick a date to quit. Set a date within the next 2 weeks to give you time to prepare.  Write down the reasons why you are quitting. Keep this list in places where you will see it often.  Tell your family, friends, and co-workers that you are quitting. Their support is important.  Talk with your doctor about the choices that may help you quit.  Find out if your health insurance will pay for these treatments.  Know the people, places, things, and activities that make you want to smoke (triggers). Avoid them. What first steps can I take to quit smoking?  Throw away all cigarettes at home, at work, and in your car.  Throw away the things that you use when you smoke, such as ashtrays and lighters.  Clean your car. Make sure to empty the ashtray.  Clean your home, including curtains and carpets. What can I do to help me quit smoking? Talk with your doctor about taking medicines and seeing a counselor at the same time. You are more likely to succeed when you do both.  If you are pregnant or breastfeeding, talk with your doctor about counseling or other ways to quit smoking. Do not take medicine to help you quit smoking unless your doctor tells you to do so. To quit smoking: Quit right away  Quit smoking totally, instead of slowly cutting back on how much you smoke over a period of time.  Go to counseling. You are more likely to quit if you go to counseling sessions regularly. Take medicine You may take medicines to help you quit. Some  medicines need a prescription, and some you can buy over-the-counter. Some medicines may contain a drug called nicotine to replace the nicotine in cigarettes. Medicines may:  Help you to stop having the desire to smoke (cravings).  Help to stop the problems that come when you stop smoking (withdrawal symptoms). Your doctor may ask you to use:  Nicotine patches, gum, or lozenges.  Nicotine inhalers or sprays.  Non-nicotine medicine that is taken by mouth. Find resources Find resources and other ways to help you quit smoking and remain smoke-free after you quit. These resources are most helpful when you use them often. They include:  Online chats with a counselor.  Phone quitlines.  Printed self-help materials.  Support groups or group counseling.  Text messaging programs.  Mobile phone apps. Use apps on your mobile phone or tablet that can help you stick to your quit plan. There are many free apps for mobile phones and tablets as well as websites. Examples include Quit Guide from the CDC and smokefree.gov   What things can I do to make it easier to quit?  Talk to your family and friends. Ask them to support and encourage you.  Call a phone quitline (1-800-QUIT-NOW), reach out to support groups, or work with a counselor.  Ask people who smoke to not smoke around you.  Avoid places that make you want to smoke,   such as: ? Bars. ? Parties. ? Smoke-break areas at work.  Spend time with people who do not smoke.  Lower the stress in your life. Stress can make you want to smoke. Try these things to help your stress: ? Getting regular exercise. ? Doing deep-breathing exercises. ? Doing yoga. ? Meditating. ? Doing a body scan. To do this, close your eyes, focus on one area of your body at a time from head to toe. Notice which parts of your body are tense. Try to relax the muscles in those areas.   How will I feel when I quit smoking? Day 1 to 3 weeks Within the first 24 hours,  you may start to have some problems that come from quitting tobacco. These problems are very bad 2-3 days after you quit, but they do not often last for more than 2-3 weeks. You may get these symptoms:  Mood swings.  Feeling restless, nervous, angry, or annoyed.  Trouble concentrating.  Dizziness.  Strong desire for high-sugar foods and nicotine.  Weight gain.  Trouble pooping (constipation).  Feeling like you may vomit (nausea).  Coughing or a sore throat.  Changes in how the medicines that you take for other issues work in your body.  Depression.  Trouble sleeping (insomnia). Week 3 and afterward After the first 2-3 weeks of quitting, you may start to notice more positive results, such as:  Better sense of smell and taste.  Less coughing and sore throat.  Slower heart rate.  Lower blood pressure.  Clearer skin.  Better breathing.  Fewer sick days. Quitting smoking can be hard. Do not give up if you fail the first time. Some people need to try a few times before they succeed. Do your best to stick to your quit plan, and talk with your doctor if you have any questions or concerns. Summary  Smoking tobacco is the leading cause of preventable death. Quitting smoking can be hard, but it is one of the best things that you can do for your health.  When you decide to quit smoking, make a plan to help you succeed.  Quit smoking right away, not slowly over a period of time.  When you start quitting, seek help from your doctor, family, or friends. This information is not intended to replace advice given to you by your health care provider. Make sure you discuss any questions you have with your health care provider. Document Revised: 06/05/2019 Document Reviewed: 11/29/2018 Elsevier Patient Education  2021 Elsevier Inc.  

## 2020-12-21 NOTE — Progress Notes (Signed)
   Subjective:    Patient ID: Bruce King, male    DOB: [birth year], 50 y.o.   MRN: [id number]  HPI Pt here for 6 month follow up on blood pressure. Pt states he is doing well. No issues. Does check blood pressure in morning. Taking meds as directed.  He has underlying heart disease Also smokes He has been counseled to quit Also has significant generalized anxiety disorder and states he is interested in tapering down on his Xanax In addition to this he does take his sertraline on a regular basis denies being depressed or suicidal.  Review of Systems  Constitutional: Negative for activity change.  HENT: Negative for congestion and rhinorrhea.   Respiratory: Negative for cough and shortness of breath.   Cardiovascular: Negative for chest pain.  Gastrointestinal: Negative for abdominal pain, diarrhea, nausea and vomiting.  Genitourinary: Negative for dysuria and hematuria.  Neurological: Negative for weakness and headaches.  Psychiatric/Behavioral: Negative for behavioral problems and confusion.       Objective:   Physical Exam Vitals reviewed.  Cardiovascular:     Rate and Rhythm: Normal rate and regular rhythm.     Heart sounds: Normal heart sounds. No murmur heard.   Pulmonary:     Effort: Pulmonary effort is normal.     Breath sounds: Normal breath sounds.  Lymphadenopathy:     Cervical: No cervical adenopathy.  Neurological:     Mental Status: He is alert.  Psychiatric:        Behavior: Behavior normal.            Assessment & Plan:  1. Mixed hyperlipidemia Continue cholesterol medicine eat healthy check lab work await results - Lipid panel - Comprehensive metabolic panel  2. Essential hypertension Blood pressure good control continue current measures stay active - Comprehensive metabolic panel  3. Generalized anxiety disorder Uses Xanax we talked today about how to taper down on Xanax he will start working on that his goal is to get to down to his few  is possible per day and possibly none he will give [place] an update within 6 weeks  4. Tobacco use Patient was encouraged to quit smoking very difficult for him to do so we did discuss patches patient is to follow-up in approximately 6 months sooner if any problems  When he follows up in 6 months we will discuss colonoscopy

## 2021-01-18 ENCOUNTER — Other Ambulatory Visit: Payer: Self-pay | Admitting: Interventional Cardiology

## 2021-02-01 NOTE — Progress Notes (Signed)
Cardiology Office Note   Date:  02/02/2021   ID:  Bruce King, DOB Oct 09, 1970, MRN [id number]  PCP:  Babs Sciara, MD    No chief complaint on file.  CAD  Wt Readings from Last 3 Encounters:  02/02/21 209 lb (94.8 kg)  12/21/20 208 lb 12.8 oz (94.7 kg)  06/23/20 221 lb (100.2 kg)       History of Present Illness: Bruce King is a 50 y.o. male  with a history of CAD s/p DES to LAD x32/2018, hypertension, hyperlipidemia, anxiety, and ongoing tobacco usepresents for hospital follow up  Admitted 09/29/18 for unstable angina. Troponin remained negative. Cath as below: SUMMARY  Widely patent stents in LAD with mild disease elsewhere. Improved flow through jailed diagonal branches.  Moderate disease in mCx (~60% - FFR 0.84)  Moderate disease in small caliber 2nd Diag prior to Bifurcation - not PCI target  Normal LVEDP  Recommended: aggressive medical therapy.  He still works at the [employer]. THey have hand sanitizer and using a mask.  Since his cath in Jan 2020, he has felt well. BP was high at that time. SInce then, BP has been controlled. Losartan was increased.   He continues to walk regularly. He still has trouble losing weight.   Still smoking.   Lost weight after he got COVID in Jan 2022 and lost his taste.  Mild sx, body aches.   Home BPs in the 120s systolic typically.   Denies : Chest pain. Dizziness. Leg edema. Nitroglycerin use. Orthopnea. Palpitations. Paroxysmal nocturnal dyspnea. Shortness of breath. Syncope.   Staying active.   Past Medical History:  Diagnosis Date  . Anxiety   . Chest pain 09/2018  . Coronary artery disease   . Depression   . GAD (generalized anxiety disorder)   . GERD (gastroesophageal reflux disease)   . History of hiatal hernia   . Hyperlipidemia   . NSTEMI (non-ST elevated myocardial infarction) (HCC) 10/30/2016    Past Surgical History:  Procedure Laterality Date  . CORONARY  ANGIOPLASTY WITH STENT PLACEMENT  10/31/2016   "3 stents"  . CORONARY STENT INTERVENTION N/A 10/31/2016   Procedure: Coronary Stent Intervention;  Surgeon: Iran Ouch, MD;  Location: [facility] INVASIVE CV LAB;  Service: Cardiovascular;  Laterality: N/A;  . FRACTURE SURGERY    . INTRAVASCULAR PRESSURE WIRE/FFR STUDY N/A 09/30/2018   Procedure: INTRAVASCULAR PRESSURE WIRE/FFR STUDY;  Surgeon: Marykay Lex, MD;  Location: [facility] INVASIVE CV LAB;  Service: Cardiovascular;  Laterality: N/A;  . LEFT HEART CATH AND CORONARY ANGIOGRAPHY N/A 10/31/2016   Procedure: Left Heart Cath and Coronary Angiography;  Surgeon: Iran Ouch, MD;  Location: [facility] INVASIVE CV LAB;  Service: Cardiovascular;  Laterality: N/A;  . LEFT HEART CATH AND CORONARY ANGIOGRAPHY N/A 09/30/2018   Procedure: LEFT HEART CATH AND CORONARY ANGIOGRAPHY;  Surgeon: Marykay Lex, MD;  Location: [facility] INVASIVE CV LAB;  Service: Cardiovascular;  Laterality: N/A;  . ORIF TIBIA & FIBULA FRACTURES Right ~ 05/1996   "titanium rod"     Current Outpatient Medications  Medication Sig Dispense Refill  . acetaminophen (TYLENOL) 500 MG tablet Take 1,000 mg by mouth every 6 (six) hours as needed.    . ALPRAZolam (XANAX) 0.5 MG tablet TAKE (1) TABLET THREE TIMES DAILY AS NEEDED. 90 tablet 1  . clopidogrel (PLAVIX) 75 MG tablet Take 1 tablet (75 mg total) by mouth daily. Please make overdue appt with Dr. Eldridge Dace before anymore refills. Thank you 1st  attempt 30 tablet 0  . losartan (COZAAR) 50 MG tablet Take 1 tablet (50 mg total) by mouth daily. 90 tablet 1  . metoprolol tartrate (LOPRESSOR) 50 MG tablet TAKE (1) TABLET TWICE A DAY. 180 tablet 3  . nitroGLYCERIN (NITROSTAT) 0.4 MG SL tablet 1 every 5 minutes SL as needed chest discomfort maximum 3 call 911 if ongoing pain 25 tablet 3  . rosuvastatin (CRESTOR) 20 MG tablet Take 1 tablet (20 mg total) by mouth daily. 90 tablet 3  . sertraline (ZOLOFT) 100 MG tablet Take 1 tablet (100 mg total) by mouth  daily. 90 tablet 1  . sildenafil (REVATIO) 20 MG tablet 3 to 5 before relations 50 tablet 6   No current facility-administered medications for this visit.    Allergies:   Bactrim [sulfamethoxazole-trimethoprim], Lipitor [atorvastatin], and Pravastatin    Social History:  The patient  reports that he has been smoking cigarettes. He started smoking about 31 years ago. He has a 25.00 pack-year smoking history. He has quit using smokeless tobacco.  His smokeless tobacco use included chew and snuff. He reports current alcohol use of about 14.0 standard drinks of alcohol per week. He reports that he does not use drugs.   Family History:  The patient's family history includes Heart attack in his father; Hypertension in his mother.    ROS:  Please see the history of present illness.   Otherwise, review of systems are positive for weight loss, taste has returned but not fully .   All other systems are reviewed and negative.    PHYSICAL EXAM: VS:  BP 128/80   Pulse 60   Ht 6\' 1"  (1.854 m)   Wt 209 lb (94.8 kg)   SpO2 98%   BMI 27.57 kg/m  , BMI Body mass index is 27.57 kg/m. GEN: Well nourished, well developed, in no acute distress  HEENT: normal  Neck: no JVD, carotid bruits, or masses Cardiac: RRR; no murmurs, rubs, or gallops,no edema  Respiratory:  clear to auscultation bilaterally, normal work of breathing GI: soft, nontender, nondistended, + BS MS: no deformity or atrophy  Skin: warm and dry, no rash Neuro:  Strength and sensation are intact Psych: euthymic mood, full affect   EKG:   The ekg ordered today demonstrates NSR, no ST changes   Recent Labs: No results found for requested labs within last 8760 hours.   Lipid Panel    Component Value Date/Time   CHOL 156 01/05/2020 0853   TRIG 362 (H) 01/05/2020 0853   HDL 22 (L) 01/05/2020 0853   CHOLHDL 7.1 (H) 01/05/2020 0853   CHOLHDL 4.6 09/30/2018 0413   VLDL 39 09/30/2018 0413   LDLCALC 76 01/05/2020 0853     Other  studies Reviewed: Additional studies/ records that were reviewed today with results demonstrating: labs reviewed.   ASSESSMENT AND PLAN:  1. CAD/Old MI: No angina.  Continue aggressive secondary prevention.  Continue clopidogrel. No bleeding problems.  2. Hyperlipidemia: LDL 76 in 2021.  Continue statin. Check labs today. 3. HTN: The current medical regimen is effective;  continue present plan and medications.  Avoid processed foods.  Increase fiber intake.  Whole food, plant-based diet. 4. Tobacco abuse: still smoking 1/2 ppd.  He found a food grade product with nicotine that can be placed in the mouth like chewing tobacco.  Hopefully, this will help him stop smoking.    Current medicines are reviewed at length with the patient today.  The patient concerns regarding his medicines  were addressed.  The following changes have been made:  No change  Labs/ tests ordered today include: CBC, CMet, lipids today No orders of the defined types were placed in this encounter.   Recommend 150 minutes/week of aerobic exercise Low fat, low carb, high fiber diet recommended  Disposition:   FU in 1 year   Signed, Lance Muss, MD  02/02/2021 12:05 PM    [facility] [address] [place], [place], [place]  [address] Phone: [phone]; Fax: [phone]

## 2021-02-02 ENCOUNTER — Encounter: Payer: Self-pay | Admitting: Interventional Cardiology

## 2021-02-02 ENCOUNTER — Ambulatory Visit (INDEPENDENT_AMBULATORY_CARE_PROVIDER_SITE_OTHER): Payer: Self-pay | Admitting: Interventional Cardiology

## 2021-02-02 ENCOUNTER — Other Ambulatory Visit: Payer: Self-pay

## 2021-02-02 VITALS — BP 128/80 | HR 60 | Ht 73.0 in | Wt 209.0 lb

## 2021-02-02 DIAGNOSIS — Z720 Tobacco use: Secondary | ICD-10-CM

## 2021-02-02 DIAGNOSIS — I252 Old myocardial infarction: Secondary | ICD-10-CM

## 2021-02-02 DIAGNOSIS — E782 Mixed hyperlipidemia: Secondary | ICD-10-CM

## 2021-02-02 DIAGNOSIS — I10 Essential (primary) hypertension: Secondary | ICD-10-CM

## 2021-02-02 DIAGNOSIS — I25118 Atherosclerotic heart disease of native coronary artery with other forms of angina pectoris: Secondary | ICD-10-CM

## 2021-02-02 NOTE — Patient Instructions (Signed)
Medication Instructions:  Your physician recommends that you continue on your current medications as directed. Please refer to the Current Medication list given to you today.  *If you need a refill on your cardiac medications before your next appointment, please call your pharmacy*   Lab Work: Lab work to be done today--CBC, CMET and Lipids If you have labs (blood work) drawn today and your tests are completely normal, you will receive your results only by: Marland Kitchen MyChart Message (if you have MyChart) OR . A paper copy in the mail If you have any lab test that is abnormal or we need to change your treatment, we will call you to review the results.   Testing/Procedures: none   Follow-Up: At [facility], you and your health needs are our priority.  As part of our continuing mission to provide you with exceptional heart care, we have created designated Provider Care Teams.  These Care Teams include your primary Cardiologist (physician) and Advanced Practice Providers (APPs -  Physician Assistants and Nurse Practitioners) who all work together to provide you with the care you need, when you need it.  We recommend signing up for the patient portal called "MyChart".  Sign up information is provided on this After Visit Summary.  MyChart is used to connect with patients for Virtual Visits (Telemedicine).  Patients are able to view lab/test results, encounter notes, upcoming appointments, etc.  Non-urgent messages can be sent to your provider as well.   To learn more about what you can do with MyChart, go to [email or web address].    Your next appointment:   12 month(s)  The format for your next appointment:   In Person  Provider:   You may see Lance Muss, MD or one of the following Advanced Practice Providers on your designated Care Team:    Ronie Spies, PA-C  Jacolyn Reedy, PA-C    Other Instructions

## 2021-02-03 LAB — COMPREHENSIVE METABOLIC PANEL
ALT: 41 IU/L (ref 0–44)
AST: 33 IU/L (ref 0–40)
Albumin/Globulin Ratio: 1.7 (ref 1.2–2.2)
Albumin: 4.7 g/dL (ref 4.0–5.0)
Alkaline Phosphatase: 116 IU/L (ref 44–121)
BUN/Creatinine Ratio: 10 (ref 9–20)
BUN: 10 mg/dL (ref 6–24)
Bilirubin Total: 0.3 mg/dL (ref 0.0–1.2)
CO2: 25 mmol/L (ref 20–29)
Calcium: 10.2 mg/dL (ref 8.7–10.2)
Chloride: 100 mmol/L (ref 96–106)
Creatinine, Ser: 1.05 mg/dL (ref 0.76–1.27)
Globulin, Total: 2.8 g/dL (ref 1.5–4.5)
Glucose: 94 mg/dL (ref 65–99)
Potassium: 4.5 mmol/L (ref 3.5–5.2)
Sodium: 139 mmol/L (ref 134–144)
Total Protein: 7.5 g/dL (ref 6.0–8.5)
eGFR: 87 mL/min/{1.73_m2} (ref >59)

## 2021-02-03 LAB — LIPID PANEL
Chol/HDL Ratio: 5.6 ratio — ABNORMAL HIGH (ref 0.0–5.0)
Cholesterol, Total: 141 mg/dL (ref 100–199)
HDL: 25 mg/dL — ABNORMAL LOW (ref >39)
LDL Chol Calc (NIH): 73 mg/dL (ref 0–99)
Triglycerides: 265 mg/dL — ABNORMAL HIGH (ref 0–149)
VLDL Cholesterol Cal: 43 mg/dL — ABNORMAL HIGH (ref 5–40)

## 2021-02-03 LAB — CBC
Hematocrit: 49.1 % (ref 37.5–51.0)
Hemoglobin: 17.3 g/dL (ref 13.0–17.7)
MCH: 31.5 pg (ref 26.6–33.0)
MCHC: 35.2 g/dL (ref 31.5–35.7)
MCV: 89 fL (ref 79–97)
Platelets: 321 10*3/uL (ref 150–450)
RBC: 5.49 x10E6/uL (ref 4.14–5.80)
RDW: 13.1 % (ref 11.6–15.4)
WBC: 11 10*3/uL — ABNORMAL HIGH (ref 3.4–10.8)

## 2021-02-04 ENCOUNTER — Other Ambulatory Visit: Payer: Self-pay | Admitting: Interventional Cardiology

## 2021-02-09 ENCOUNTER — Encounter: Payer: Self-pay | Admitting: Family Medicine

## 2021-02-09 DIAGNOSIS — E782 Mixed hyperlipidemia: Secondary | ICD-10-CM

## 2021-02-14 MED ORDER — FENOFIBRATE 134 MG PO CAPS: 134.0000 mg | ORAL_CAPSULE | Freq: Every day | ORAL | 11 refills | Status: DC

## 2021-02-14 NOTE — Addendum Note (Signed)
Addended by: Cheree Ditto on: 02/14/2021 12:25 PM   Modules accepted: Orders

## 2021-02-18 ENCOUNTER — Other Ambulatory Visit: Payer: Self-pay | Admitting: Interventional Cardiology

## 2021-02-22 ENCOUNTER — Telehealth: Payer: Self-pay | Admitting: Interventional Cardiology

## 2021-02-22 MED ORDER — CLOPIDOGREL BISULFATE 75 MG PO TABS: 1.0000 | ORAL_TABLET | Freq: Every day | ORAL | 3 refills | Status: DC

## 2021-02-22 MED ORDER — ROSUVASTATIN CALCIUM 20 MG PO TABS: 20.0000 mg | ORAL_TABLET | Freq: Every day | ORAL | 3 refills | Status: DC

## 2021-02-22 NOTE — Telephone Encounter (Signed)
Pt's medications were sent to pt's pharmacy as requested. Confirmation received.  

## 2021-02-22 NOTE — Telephone Encounter (Signed)
*  STAT* If patient is at the pharmacy, call can be transferred to refill team.   1. Which medications need to be refilled? (please list name of each medication and dose if known)  clopidogrel (PLAVIX) 75 MG tablet rosuvastatin (CRESTOR) 20 MG tablet  2. Which pharmacy/location (including street and city if local pharmacy) is medication to be sent to? [facility] [place], [place] - [address]  3. Do they need a 30 day or 90 day supply? 90 day supply  The pharmacist at [facility] states the patient was completely out of medication on 02/18/21 and he gave him a 3 day supply and sent in a refill request.

## 2021-02-27 ENCOUNTER — Other Ambulatory Visit: Payer: Self-pay | Admitting: Family Medicine

## 2021-02-27 ENCOUNTER — Encounter: Payer: Self-pay | Admitting: Family Medicine

## 2021-02-27 MED ORDER — ALPRAZOLAM 0.5 MG PO TABS: ORAL_TABLET | ORAL | 0 refills | Status: DC

## 2021-06-05 ENCOUNTER — Other Ambulatory Visit: Payer: Self-pay | Admitting: Family Medicine

## 2021-06-06 MED ORDER — ALPRAZOLAM 0.5 MG PO TABS: 0.5000 mg | ORAL_TABLET | Freq: Three times a day (TID) | ORAL | 0 refills | Status: DC | PRN

## 2021-06-23 ENCOUNTER — Other Ambulatory Visit: Payer: Self-pay | Admitting: Family Medicine

## 2021-06-27 NOTE — Telephone Encounter (Signed)
Patient has appointment 10/14 for medication followup

## 2021-06-27 NOTE — Telephone Encounter (Signed)
Sent my chart 10/4

## 2021-07-04 ENCOUNTER — Other Ambulatory Visit: Payer: Self-pay | Admitting: Family Medicine

## 2021-07-05 ENCOUNTER — Other Ambulatory Visit: Payer: Self-pay | Admitting: Family Medicine

## 2021-07-06 ENCOUNTER — Other Ambulatory Visit: Payer: Self-pay | Admitting: Family Medicine

## 2021-07-06 MED ORDER — ALPRAZOLAM 0.5 MG PO TABS: 0.5000 mg | ORAL_TABLET | Freq: Three times a day (TID) | ORAL | 0 refills | Status: DC | PRN

## 2021-07-07 ENCOUNTER — Encounter: Payer: Self-pay | Admitting: Family Medicine

## 2021-07-07 ENCOUNTER — Other Ambulatory Visit: Payer: Self-pay

## 2021-07-07 ENCOUNTER — Ambulatory Visit: Payer: Self-pay | Admitting: Family Medicine

## 2021-07-07 VITALS — BP 140/82 | HR 56 | Temp 97.2°F | Wt 215.2 lb

## 2021-07-07 DIAGNOSIS — I10 Essential (primary) hypertension: Secondary | ICD-10-CM

## 2021-07-07 DIAGNOSIS — Z23 Encounter for immunization: Secondary | ICD-10-CM

## 2021-07-07 DIAGNOSIS — F411 Generalized anxiety disorder: Secondary | ICD-10-CM

## 2021-07-07 MED ORDER — SILDENAFIL CITRATE 100 MG PO TABS: 50.0000 mg | ORAL_TABLET | Freq: Every day | ORAL | 4 refills | Status: DC | PRN

## 2021-07-07 MED ORDER — SERTRALINE HCL 100 MG PO TABS: 100.0000 mg | ORAL_TABLET | Freq: Every day | ORAL | 1 refills | Status: DC

## 2021-07-07 MED ORDER — ALPRAZOLAM 0.5 MG PO TABS: 0.5000 mg | ORAL_TABLET | Freq: Two times a day (BID) | ORAL | 5 refills | Status: DC | PRN

## 2021-07-07 NOTE — Progress Notes (Signed)
   Subjective:    Patient ID: Bruce King, male    DOB: 1971/01/03, 50 y.o.   MRN: [id number]  HPI Pt here for med follow up. Pt states blood pressure has been doing good.  Need for vaccination - Plan: Flu Vaccine QUAD 6+ mos PF IM (Fluarix Quad PF)  Generalized anxiety disorder  Essential hypertension  We did discuss the importance of trying to quit smoking He is taking the sertraline regular basis Uses Xanax we have discussed tapering down on this In addition to this taking his cholesterol medicine regular basis   Review of Systems     Objective:   Physical Exam  General-in no acute distress Eyes-no discharge Lungs-respiratory rate normal, CTA CV-no murmurs,RRR Extremities skin warm dry no edema Neuro grossly normal Behavior normal, alert       Assessment & Plan:  1. Need for vaccination Today - Flu Vaccine QUAD 6+ mos PF IM (Fluarix Quad PF)  2. Generalized anxiety disorder Continue sertraline continue Xanax taper down to twice per day gradually taper down even further if he is willing to do so  3. Essential hypertension Blood pressure good control continue current measures Cholesterol good control Counseled quitting smoking

## 2021-07-11 ENCOUNTER — Other Ambulatory Visit: Payer: Self-pay | Admitting: Family Medicine

## 2021-07-11 DIAGNOSIS — Z1211 Encounter for screening for malignant neoplasm of colon: Secondary | ICD-10-CM

## 2021-09-12 ENCOUNTER — Encounter: Payer: Self-pay | Admitting: Family Medicine

## 2021-09-12 NOTE — Telephone Encounter (Signed)
My chart message sent by Dr Lorin Picket to patient discussing warning signs and advising patient to o to ER if worse. Work not will be provided thru My chart- patient is aware.

## 2021-09-12 NOTE — Telephone Encounter (Signed)
Nurses  He may have a note for work from December 18 through December 28 Wednesday.  Under current guidelines he could return to work with a mask on on December 26 if he desires to do so  Also please send to him the message that if he starts having significant shortness of breath breathing difficulties passing out anything worrisome to go to the ER to be checked call us if any questions

## 2021-09-13 ENCOUNTER — Encounter: Payer: Self-pay | Admitting: Family Medicine

## 2021-09-20 ENCOUNTER — Other Ambulatory Visit: Payer: Self-pay | Admitting: Family Medicine

## 2021-10-13 ENCOUNTER — Encounter: Payer: Self-pay | Admitting: Family Medicine

## 2021-10-15 NOTE — Telephone Encounter (Signed)
Nurses I would recommend increasing his losartan to 100 mg daily, #90, 1 daily  You may also send the following message to [employer] healthy diet, avoid excessive salt, stay physically active, doing your best to stay away from smoking can all help keep blood pressure down.  It is not unusual as we get older do sometimes have to be on a stronger dose.  I would recommend going up on your medication to 100 mg of losartan daily.  If your blood pressure does not significantly improve over the next few weeks to be more similar to the 130s over 70s or 80s it would be wise to follow-up in several weeks  Hopefully the increased dose will help.  Keep [place] posted thanks-Dr. Lorin Picket

## 2021-10-16 ENCOUNTER — Other Ambulatory Visit: Payer: Self-pay

## 2021-10-16 DIAGNOSIS — I10 Essential (primary) hypertension: Secondary | ICD-10-CM

## 2021-10-16 MED ORDER — LOSARTAN POTASSIUM 100 MG PO TABS: 100.0000 mg | ORAL_TABLET | Freq: Every day | ORAL | 1 refills | Status: DC

## 2021-11-06 ENCOUNTER — Encounter: Payer: Self-pay | Admitting: Interventional Cardiology

## 2021-11-06 NOTE — Telephone Encounter (Signed)
Spoke with pt and felt "different" this am checked B/P and was high 180/100 this reading was prior to taking Losartan which was increased 2 weeks ago by PCP to 100 mg from 50 mg Rechecked B/P  and was 150 /98 Per pt also ran out of Metoprolol  and did not take over the weekend  will pick this up today and restart Encouraged pt to check B/P over the next few days and if finds consistently  elevated readings to send those to mychart Pt agrees with plan Will forward message to Dr Irish Lack for review and recommendations ./cy

## 2021-12-28 ENCOUNTER — Other Ambulatory Visit: Payer: Self-pay | Admitting: Family Medicine

## 2022-01-04 ENCOUNTER — Ambulatory Visit (INDEPENDENT_AMBULATORY_CARE_PROVIDER_SITE_OTHER): Payer: Self-pay | Admitting: Family Medicine

## 2022-01-04 VITALS — BP 138/87 | HR 62 | Temp 98.1°F | Ht 73.0 in | Wt 217.0 lb

## 2022-01-04 DIAGNOSIS — I10 Essential (primary) hypertension: Secondary | ICD-10-CM

## 2022-01-04 DIAGNOSIS — Z125 Encounter for screening for malignant neoplasm of prostate: Secondary | ICD-10-CM

## 2022-01-04 DIAGNOSIS — Z79899 Other long term (current) drug therapy: Secondary | ICD-10-CM

## 2022-01-04 DIAGNOSIS — E782 Mixed hyperlipidemia: Secondary | ICD-10-CM

## 2022-01-04 MED ORDER — VALSARTAN 160 MG PO TABS: 160.0000 mg | ORAL_TABLET | Freq: Every day | ORAL | 1 refills | Status: DC

## 2022-01-04 MED ORDER — SERTRALINE HCL 100 MG PO TABS: 100.0000 mg | ORAL_TABLET | Freq: Every day | ORAL | 1 refills | Status: DC

## 2022-01-04 MED ORDER — ALPRAZOLAM 0.5 MG PO TABS: ORAL_TABLET | ORAL | 5 refills | Status: DC

## 2022-01-04 NOTE — Patient Instructions (Signed)

## 2022-01-04 NOTE — Progress Notes (Signed)
? ?  Subjective:  ? ? Patient ID: Bruce King, male    DOB: 11/26/1970, 51 y.o.   MRN: [id number] ? ?Hypertension ?This is a chronic problem. The problem is controlled. Treatments tried: losartan.  ?Hyperlipidemia ?This is a chronic problem. Treatments tried: rosuvastatin.  ?6 month follow up  ?GAD- medication refill ?Still smokes he knows he needs to quit smoking.  He denies any major setbacks recently denies any chest tightness pressure pain shortness of breath ?Review of Systems ? ?   ?Objective:  ? Physical Exam ? ?General-in no acute distress ?Eyes-no discharge ?Lungs-respiratory rate normal, CTA ?CV-no murmurs,RRR ?Extremities skin warm dry no edema ?Neuro grossly normal ?Behavior normal, alert ? ? ? ?   ?Assessment & Plan:  ?1. Essential hypertension ?Blood pressure decent control but it would be better if he ate more vegetables and fruits.  Fit in more walking.  And also work hard at quitting smoking or at least cutting back even more.  I also recommended that he stop losartan and in its place start valsartan 160 mg 1 daily.  He will check his lab work within the next 2 weeks. ?- Basic metabolic panel ?- Hepatic function panel ?- Lipid panel ?- PSA ? ?2. Mixed hyperlipidemia ?Very important to take a statin watch diet increase vegetables and fruits and cut back on smoking continue medication ?- Basic metabolic panel ?- Hepatic function panel ?- Lipid panel ?- PSA ? ?3. High risk medication use ?Check liver function ?- Basic metabolic panel ?- Hepatic function panel ?- Lipid panel ?- PSA ? ?4. Screening for prostate cancer ?PSA ordered ?- PSA ?Follow-up in 3 to 4 months wellness exam later this year ? ?He does have underlying anxiety he does better on the sertraline he will stick with that he will stick with the Xanax no more than twice per day avoid alcohol use with Xanax caution drowsiness ? ?

## 2022-01-05 ENCOUNTER — Ambulatory Visit: Payer: Self-pay | Admitting: Family Medicine

## 2022-02-05 ENCOUNTER — Other Ambulatory Visit: Payer: Self-pay | Admitting: Interventional Cardiology

## 2022-02-13 ENCOUNTER — Other Ambulatory Visit: Payer: Self-pay | Admitting: Interventional Cardiology

## 2022-02-21 ENCOUNTER — Other Ambulatory Visit: Payer: Self-pay | Admitting: Interventional Cardiology

## 2022-03-09 ENCOUNTER — Other Ambulatory Visit: Payer: Self-pay | Admitting: Interventional Cardiology

## 2022-03-28 ENCOUNTER — Other Ambulatory Visit: Payer: Self-pay | Admitting: Interventional Cardiology

## 2022-04-02 ENCOUNTER — Other Ambulatory Visit: Payer: Self-pay | Admitting: Interventional Cardiology

## 2022-04-25 ENCOUNTER — Encounter: Payer: Self-pay | Admitting: Family Medicine

## 2022-04-25 ENCOUNTER — Other Ambulatory Visit: Payer: Self-pay | Admitting: Interventional Cardiology

## 2022-04-25 NOTE — Telephone Encounter (Signed)
May have a note to return to work on Thursday Under CDC guidelines is to wear a mask when around others for 5 additional days that would be Thursday Friday Saturday Sunday Monday

## 2022-05-11 ENCOUNTER — Other Ambulatory Visit: Payer: Self-pay | Admitting: Interventional Cardiology

## 2022-05-14 NOTE — Progress Notes (Unsigned)
Cardiology Office Note   Date:  05/15/2022   ID:  Bruce King, DOB [birth year], MRN [id number]  PCP:  Kathyrn Drown, MD    No chief complaint on file.  CAD  Wt Readings from Last 3 Encounters:  05/15/22 224 lb (101.6 kg)  01/04/22 217 lb (98.4 kg)  07/07/21 215 lb 3.2 oz (97.6 kg)       History of Present Illness: Bruce King is a 51 y.o. male  with a history of CAD s/p DES to LAD x3 10/2016, hypertension, hyperlipidemia, anxiety, and ongoing tobacco use presents for hospital follow up   Admitted 09/29/18 for unstable angina. Troponin remained negative. Cath as below: SUMMARY Widely patent stents in LAD with mild disease elsewhere.  Improved flow through jailed diagonal branches. Moderate disease in mCx (~60% - FFR 0.84) Moderate disease in small caliber 2nd Diag prior to Bifurcation - not PCI target Normal LVEDP   Recommended: aggressive medical therapy.  BP was high in 2020.  SInce then, BP has been controlled. Losartan was increased.    Still smoking.    Lost weight after he got COVID in Jan 2022 and lost his taste.  Mild sx, body aches.   Gained weight since that time.  He held his beta blocker on his own and monitored HR.  Resting was 75-90.   Denies : Chest pain. Dizziness. Leg edema. Nitroglycerin use. Orthopnea. Palpitations. Paroxysmal nocturnal dyspnea. Shortness of breath. Syncope.    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  Decreased bread intake.  Enjoys dairy and eggs.   Past Medical History:  Diagnosis Date   Anxiety    Chest pain 09/2018   Coronary artery disease    Depression    GAD (generalized anxiety disorder)    GERD (gastroesophageal reflux disease)    History of hiatal hernia    Hyperlipidemia    NSTEMI (non-ST elevated myocardial infarction) ([place]) 10/30/2016    Past Surgical History:  Procedure Laterality Date   CORONARY ANGIOPLASTY WITH STENT PLACEMENT  10/31/2016   "3 stents"   CORONARY STENT INTERVENTION N/A 10/31/2016    Procedure: Coronary Stent Intervention;  Surgeon: Wellington Hampshire, MD;  Location: [facility] CV LAB;  Service: Cardiovascular;  Laterality: N/A;   FRACTURE SURGERY     INTRAVASCULAR PRESSURE WIRE/FFR STUDY N/A 09/30/2018   Procedure: INTRAVASCULAR PRESSURE WIRE/FFR STUDY;  Surgeon: Leonie Man, MD;  Location: [place] CV LAB;  Service: Cardiovascular;  Laterality: N/A;   LEFT HEART CATH AND CORONARY ANGIOGRAPHY N/A 10/31/2016   Procedure: Left Heart Cath and Coronary Angiography;  Surgeon: Wellington Hampshire, MD;  Location: [place] CV LAB;  Service: Cardiovascular;  Laterality: N/A;   LEFT HEART CATH AND CORONARY ANGIOGRAPHY N/A 09/30/2018   Procedure: LEFT HEART CATH AND CORONARY ANGIOGRAPHY;  Surgeon: Leonie Man, MD;  Location: [place] [facility] LAB;  Service: Cardiovascular;  Laterality: N/A;   ORIF TIBIA & FIBULA FRACTURES Right ~ 05/1996   "titanium rod"     Current Outpatient Medications  Medication Sig Dispense Refill   acetaminophen (TYLENOL) 500 MG tablet Take 1,000 mg by mouth every 6 (six) hours as needed.     ALPRAZolam (XANAX) 0.5 MG tablet TAKE ONE TABLET BY MOUTH TWICE DAILY AS NEEDED FOR SLEEP OR FOR ANXIETY 60 tablet 5   clopidogrel (PLAVIX) 75 MG tablet Take 1 tablet (75 mg total) by mouth daily. 30 tablet 1   fenofibrate micronized (LOFIBRA) 134 MG capsule Take 1 capsule (  134 mg total) by mouth daily before breakfast. 30 capsule 11   metoprolol tartrate (LOPRESSOR) 50 MG tablet Take 1 tablet (50 mg total) by mouth 2 (two) times daily. 60 tablet 0   nitroGLYCERIN (NITROSTAT) 0.4 MG SL tablet 1 every 5 minutes SL as needed chest discomfort maximum 3 call 911 if ongoing pain 25 tablet 3   rosuvastatin (CRESTOR) 20 MG tablet TAKE ONE TABLET BY MOUTH EVERY DAY 30 tablet 0   sertraline (ZOLOFT) 100 MG tablet Take 1 tablet (100 mg total) by mouth daily. 90 tablet 1   sildenafil (VIAGRA) 100 MG tablet Take 0.5-1 tablets (50-100 mg total) by mouth daily as needed for erectile  dysfunction. 10 tablet 4   valsartan (DIOVAN) 160 MG tablet Take 1 tablet (160 mg total) by mouth daily. 90 tablet 1   No current facility-administered medications for this visit.    Allergies:   Bactrim [sulfamethoxazole-trimethoprim], Lipitor [atorvastatin], and Pravastatin    Social History:  The patient  reports that he has been smoking cigarettes. He started smoking about 32 years ago. He has a 25.00 pack-year smoking history. He has quit using smokeless tobacco.  His smokeless tobacco use included chew and snuff. He reports current alcohol use of about 14.0 standard drinks of alcohol per week. He reports that he does not use drugs.   Family History:  The patient's family history includes Heart attack in his father; Hypertension in his mother.    ROS:  Please see the history of present illness.   Otherwise, review of systems are positive for rare sharp pains in the left chest in the muscles outside of the rib cage.   All other systems are reviewed and negative.    PHYSICAL EXAM: VS:  BP (!) 140/90 (BP Location: Left Arm, Patient Position: Sitting, Cuff Size: Normal)   Pulse (!) 58   Ht 6\' 1"  (1.854 m)   Wt 224 lb (101.6 kg)   BMI 29.55 kg/m  , BMI Body mass index is 29.55 kg/m. GEN: Well nourished, well developed, in no acute distress HEENT: normal Neck: no JVD, carotid bruits, or masses Cardiac: RRR; no murmurs, rubs, or gallops,no edema  Respiratory:  clear to auscultation bilaterally, normal work of breathing GI: soft, nontender, nondistended, + BS MS: no deformity or atrophy; right leg scars Skin: warm and dry, no rash Neuro:  Strength and sensation are intact Psych: euthymic mood, full affect   EKG:   The ekg ordered today demonstrates sinus bradycardia, no ST changes   Recent Labs: No results found for requested labs within last 365 days.   Lipid Panel    Component Value Date/Time   CHOL 141 02/02/2021 1224   TRIG 265 (H) 02/02/2021 1224   HDL 25 (L)  02/02/2021 1224   CHOLHDL 5.6 (H) 02/02/2021 1224   CHOLHDL 4.6 09/30/2018 0413   VLDL 39 09/30/2018 0413   LDLCALC 73 02/02/2021 1224     Other studies Reviewed: Additional studies/ records that were reviewed today with results demonstrating: labs reviewed.   ASSESSMENT AND PLAN:  CAD/Old MI: No angina.  Continue aggressive secondary prevention. No CHF sx. He will let 04/04/2021 know if sx change.  He understands how to improve lifestyle.  Hyperlipidemia: LDL 73 in 5/22.   COntinue Crestor.  Whole food, plant based diet.  Avoiding processed foods. HTN: Home readings in the 130/80s. The current medical regimen is effective;  continue present plan and medications. Tobacco abuse: Cut back to 0.5 ppd. Wants to quit.  Current medicines are reviewed at length with the patient today.  The patient concerns regarding his medicines were addressed.  The following changes have been made:  No change  Labs/ tests ordered today include:  No orders of the defined types were placed in this encounter.   Recommend 150 minutes/week of aerobic exercise Low fat, low carb, high fiber diet recommended  Disposition:   FU in 1 year   Signed, Lance Muss, MD  05/15/2022 1:38 PM    [facility] [address] [place], [place], [place]  [address] Phone: [phone]; Fax: [phone]

## 2022-05-15 ENCOUNTER — Encounter: Payer: Self-pay | Admitting: Interventional Cardiology

## 2022-05-15 ENCOUNTER — Ambulatory Visit (INDEPENDENT_AMBULATORY_CARE_PROVIDER_SITE_OTHER): Payer: Self-pay | Admitting: Interventional Cardiology

## 2022-05-15 ENCOUNTER — Telehealth: Payer: Self-pay | Admitting: Licensed Clinical Social Worker

## 2022-05-15 VITALS — BP 140/90 | HR 58 | Ht 73.0 in | Wt 224.0 lb

## 2022-05-15 DIAGNOSIS — I10 Essential (primary) hypertension: Secondary | ICD-10-CM

## 2022-05-15 DIAGNOSIS — E782 Mixed hyperlipidemia: Secondary | ICD-10-CM

## 2022-05-15 DIAGNOSIS — I252 Old myocardial infarction: Secondary | ICD-10-CM

## 2022-05-15 DIAGNOSIS — Z720 Tobacco use: Secondary | ICD-10-CM

## 2022-05-15 DIAGNOSIS — I25118 Atherosclerotic heart disease of native coronary artery with other forms of angina pectoris: Secondary | ICD-10-CM

## 2022-05-15 NOTE — Patient Instructions (Signed)

## 2022-05-15 NOTE — Telephone Encounter (Signed)
H&V Care Navigation CSW Progress Note  Clinical Social Worker contacted patient by phone to f/u after appt with Dr. Varanasi this morning. Pt noted to be uninsured- may qualify for [facility] Financial Assistance and/or Medication Assistance. No answer at [phone], I left voicemail requesting call back- will reattempt as able.    Patient is participating in a Managed Medicaid Plan:  No, self pay only.  SDOH Screenings   Alcohol Screen: Not on file  Depression (PHQ2-9): Low Risk  (12/21/2020)   Depression (PHQ2-9)    PHQ-2 Score: 4  Financial Resource Strain: Not on file  Food Insecurity: Not on file  Housing: Not on file  Physical Activity: Not on file  Social Connections: Not on file  Stress: Not on file  Tobacco Use: High Risk (05/15/2022)   Patient History    Smoking Tobacco Use: Every Day    Smokeless Tobacco Use: Former    Passive Exposure: Not on file  Transportation Needs: Not on file    Tniya Bowditch, MSW, LCSW Clinical Social Worker II [facility] Navigation  [phone]- work cell phone (preferred) [phone]- desk phone  

## 2022-05-17 ENCOUNTER — Telehealth: Payer: Self-pay | Admitting: Licensed Clinical Social Worker

## 2022-05-17 NOTE — Telephone Encounter (Signed)
H&V Care Navigation CSW Progress Note  Clinical Social Worker contacted patient by phone to f/u after appt with Dr. Eldridge Dace this morning. Pt noted to be uninsured- may qualify for [employer] and/or Medication Assistance. No answer at [phone], I left voicemail requesting call back- will reattempt as able.    Patient is participating in a Managed Medicaid Plan:  No, self pay only.  SDOH Screenings   Alcohol Screen: Not on file  Depression (PHQ2-9): Low Risk  (12/21/2020)   Depression (PHQ2-9)    PHQ-2 Score: 4  Financial Resource Strain: Not on file  Food Insecurity: Not on file  Housing: Not on file  Physical Activity: Not on file  Social Connections: Not on file  Stress: Not on file  Tobacco Use: High Risk (05/15/2022)   Patient History    Smoking Tobacco Use: Every Day    Smokeless Tobacco Use: Former    Passive Exposure: Not on file  Transportation Needs: Not on file    Octavio Graves, MSW, LCSW Clinical Social Worker II [facility] Heart/Vascular Care Navigation  [phone]- work cell phone (preferred) [phone]- desk phone

## 2022-05-18 ENCOUNTER — Telehealth: Payer: Self-pay | Admitting: Licensed Clinical Social Worker

## 2022-05-18 NOTE — Telephone Encounter (Signed)
H&V Care Navigation CSW Progress Note  Clinical Social Worker contacted patient by phone to f/u after appt with Dr. Eldridge Dace. Pt noted to be uninsured- may qualify for [employer] and/or Medication Assistance. No answer at [phone], I left third voicemail requesting call back- will mailed pt [facility] Financial Assistance application. Remain available.    Patient is participating in a Managed Medicaid Plan:  No, self pay only.  SDOH Screenings   Alcohol Screen: Not on file  Depression (PHQ2-9): Low Risk  (12/21/2020)   Depression (PHQ2-9)    PHQ-2 Score: 4  Financial Resource Strain: Not on file  Food Insecurity: Not on file  Housing: Not on file  Physical Activity: Not on file  Social Connections: Not on file  Stress: Not on file  Tobacco Use: High Risk (05/15/2022)   Patient History    Smoking Tobacco Use: Every Day    Smokeless Tobacco Use: Former    Passive Exposure: Not on file  Transportation Needs: Not on file    Octavio Graves, MSW, LCSW Clinical Social Worker II [facility] Heart/Vascular Care Navigation  [phone]- work cell phone (preferred) [phone]- desk phone

## 2022-06-08 ENCOUNTER — Other Ambulatory Visit: Payer: Self-pay | Admitting: Interventional Cardiology

## 2022-07-26 ENCOUNTER — Other Ambulatory Visit: Payer: Self-pay | Admitting: Family Medicine

## 2022-07-29 ENCOUNTER — Encounter: Payer: Self-pay | Admitting: Family Medicine

## 2022-07-30 MED ORDER — PREDNISONE 20 MG PO TABS: ORAL_TABLET | ORAL | 0 refills | Status: DC

## 2022-07-30 NOTE — Telephone Encounter (Signed)
Nurses- Sorry to hear he is having such troubles Please send in prednisone 20 mg 3/day for 2 days, 2/day for 2 days, 1/day for 4 days #12  If any ongoing troubles please follow-up office visit thank you

## 2022-08-17 ENCOUNTER — Other Ambulatory Visit: Payer: Self-pay | Admitting: Family Medicine

## 2022-08-24 ENCOUNTER — Other Ambulatory Visit: Payer: Self-pay | Admitting: Family Medicine

## 2022-09-06 ENCOUNTER — Ambulatory Visit (INDEPENDENT_AMBULATORY_CARE_PROVIDER_SITE_OTHER): Payer: Self-pay | Admitting: Family Medicine

## 2022-09-06 VITALS — BP 144/92 | HR 65 | Temp 97.9°F | Wt 230.9 lb

## 2022-09-06 DIAGNOSIS — Z125 Encounter for screening for malignant neoplasm of prostate: Secondary | ICD-10-CM

## 2022-09-06 DIAGNOSIS — F411 Generalized anxiety disorder: Secondary | ICD-10-CM

## 2022-09-06 DIAGNOSIS — Z720 Tobacco use: Secondary | ICD-10-CM

## 2022-09-06 DIAGNOSIS — M5431 Sciatica, right side: Secondary | ICD-10-CM

## 2022-09-06 DIAGNOSIS — I10 Essential (primary) hypertension: Secondary | ICD-10-CM

## 2022-09-06 DIAGNOSIS — E782 Mixed hyperlipidemia: Secondary | ICD-10-CM

## 2022-09-06 MED ORDER — DULOXETINE HCL 60 MG PO CPEP: ORAL_CAPSULE | ORAL | 5 refills | Status: DC

## 2022-09-06 MED ORDER — ALPRAZOLAM 0.5 MG PO TABS: ORAL_TABLET | ORAL | 5 refills | Status: DC

## 2022-09-06 MED ORDER — VALSARTAN-HYDROCHLOROTHIAZIDE 160-12.5 MG PO TABS: 1.0000 | ORAL_TABLET | Freq: Every day | ORAL | 1 refills | Status: DC

## 2022-09-06 NOTE — Patient Instructions (Signed)
Please keep track of your blood pressures If your readings continue to stay high let us know Please follow-up within 6 months Please do your blood work in approximately 1 month Please work hard on quitting smoking If the Cymbalta does not adequately help the combination of anxiousness, moods, and sciatica please let us know

## 2022-09-06 NOTE — Progress Notes (Signed)
   Subjective:    Patient ID: Bruce King, male    DOB: 14-Jul-1971, 51 y.o.   MRN: [id number]  HPI Patient arrives today for medication follow up and refills. Patient comes in today for follow-up He states his blood pressure has been running higher than it normally does He is trying to be healthy with his eating He still smokes He knows he needs to quit He denies any chest tightness pressure pain shortness of breath Does follow-up with cardiology Also has underlying anxiety which is chronic.  It is worse on some days but he denies being depressed States medicine doing a good job take care of him currently Also relates sciatica down the right leg into the right knee no swelling in the leg.  He just finds certain movements make the pain worse.  He has had this off and on for a significant length of time  Patient states no concerns or issues today.    Review of Systems     Objective:   Physical Exam General-in no acute distress Eyes-no discharge Lungs-respiratory rate normal, CTA CV-no murmurs,RRR Extremities skin warm dry no edema Neuro grossly normal Behavior normal, alert Subjective discomfort right leg no swelling-pain goes from the buttock region down the leg       Assessment & Plan:  1. Essential hypertension Blood pressure subpar control adjust medication patient to check blood pressure on a regular basis and send [place] some readings - Basic Metabolic Panel (7) - Lipid panel - Hepatic Function Panel  2. Mixed hyperlipidemia Lab work ordered await results healthy diet continue medication  3. Tobacco use Patient encouraged to quit smoking  4. Generalized anxiety disorder Anxiety depression under good control continue current measures patient has been on Xanax for years he was encouraged not to exceed the prescription.-He feels that the sertraline may not be helping him much anymore so we will shift gears and go towards Cymbalta and the goal would be to help  with anxiety depression but also help with his sciatica.  He will give [place] feedback over the next 30 days  5. Sciatica, right side Stretches recommended if progressive troubles or worse will need to see orthopedic back specialist  6. Screening PSA (prostate specific antigen) Screening PSA - PSA  6 months

## 2023-01-19 LAB — LIPID PANEL
Chol/HDL Ratio: 5.3 ratio — ABNORMAL HIGH (ref 0.0–5.0)
Cholesterol, Total: 123 mg/dL (ref 100–199)
HDL: 23 mg/dL — ABNORMAL LOW (ref >39)
LDL Chol Calc (NIH): 49 mg/dL (ref 0–99)
Triglycerides: 335 mg/dL — ABNORMAL HIGH (ref 0–149)
VLDL Cholesterol Cal: 51 mg/dL — ABNORMAL HIGH (ref 5–40)

## 2023-01-19 LAB — HEPATIC FUNCTION PANEL
ALT: 67 IU/L — ABNORMAL HIGH (ref 0–44)
AST: 46 IU/L — ABNORMAL HIGH (ref 0–40)
Albumin: 4.6 g/dL (ref 3.8–4.9)
Alkaline Phosphatase: 107 IU/L (ref 44–121)
Bilirubin Total: 0.5 mg/dL (ref 0.0–1.2)
Bilirubin, Direct: 0.12 mg/dL (ref 0.00–0.40)
Total Protein: 7.6 g/dL (ref 6.0–8.5)

## 2023-01-19 LAB — BASIC METABOLIC PANEL (7)
BUN/Creatinine Ratio: 10 (ref 9–20)
BUN: 11 mg/dL (ref 6–24)
CO2: 27 mmol/L (ref 20–29)
Chloride: 96 mmol/L (ref 96–106)
Creatinine, Ser: 1.13 mg/dL (ref 0.76–1.27)
Glucose: 110 mg/dL — ABNORMAL HIGH (ref 70–99)
Potassium: 3.9 mmol/L (ref 3.5–5.2)
Sodium: 141 mmol/L (ref 134–144)
eGFR: 79 mL/min/{1.73_m2} (ref >59)

## 2023-01-19 LAB — PSA: Prostate Specific Ag, Serum: 1.2 ng/mL (ref 0.0–4.0)

## 2023-02-04 ENCOUNTER — Ambulatory Visit: Payer: Self-pay | Admitting: Family Medicine

## 2023-02-25 ENCOUNTER — Emergency Department (HOSPITAL_COMMUNITY)
Admission: EM | Admit: 2023-02-25 | Discharge: 2023-02-25 | Disposition: A | Discharge status: Home / Self Care | Payer: Self-pay | Attending: Emergency Medicine | Admitting: Emergency Medicine

## 2023-02-25 ENCOUNTER — Emergency Department (HOSPITAL_COMMUNITY): Payer: Self-pay

## 2023-02-25 ENCOUNTER — Encounter (HOSPITAL_COMMUNITY): Payer: Self-pay | Admitting: Emergency Medicine

## 2023-02-25 ENCOUNTER — Other Ambulatory Visit: Payer: Self-pay

## 2023-02-25 DIAGNOSIS — Z79899 Other long term (current) drug therapy: Secondary | ICD-10-CM | POA: Insufficient documentation

## 2023-02-25 DIAGNOSIS — E876 Hypokalemia: Secondary | ICD-10-CM | POA: Insufficient documentation

## 2023-02-25 DIAGNOSIS — R42 Dizziness and giddiness: Secondary | ICD-10-CM | POA: Insufficient documentation

## 2023-02-25 DIAGNOSIS — Z7902 Long term (current) use of antithrombotics/antiplatelets: Secondary | ICD-10-CM | POA: Insufficient documentation

## 2023-02-25 DIAGNOSIS — R200 Anesthesia of skin: Secondary | ICD-10-CM | POA: Insufficient documentation

## 2023-02-25 DIAGNOSIS — N289 Disorder of kidney and ureter, unspecified: Secondary | ICD-10-CM | POA: Insufficient documentation

## 2023-02-25 DIAGNOSIS — R079 Chest pain, unspecified: Secondary | ICD-10-CM

## 2023-02-25 DIAGNOSIS — R072 Precordial pain: Secondary | ICD-10-CM | POA: Insufficient documentation

## 2023-02-25 DIAGNOSIS — D72829 Elevated white blood cell count, unspecified: Secondary | ICD-10-CM | POA: Insufficient documentation

## 2023-02-25 DIAGNOSIS — I2510 Atherosclerotic heart disease of native coronary artery without angina pectoris: Secondary | ICD-10-CM | POA: Insufficient documentation

## 2023-02-25 LAB — TROPONIN I (HIGH SENSITIVITY)
Troponin I (High Sensitivity): 11 ng/L (ref <18)
Troponin I (High Sensitivity): 11 ng/L (ref <18)

## 2023-02-25 LAB — CBC
HCT: 47 % (ref 39.0–52.0)
Hemoglobin: 17 g/dL (ref 13.0–17.0)
MCH: 31.7 pg (ref 26.0–34.0)
MCHC: 36.2 g/dL — ABNORMAL HIGH (ref 30.0–36.0)
MCV: 87.5 fL (ref 80.0–100.0)
Platelets: 296 10*3/uL (ref 150–400)
RBC: 5.37 MIL/uL (ref 4.22–5.81)
RDW: 12.8 % (ref 11.5–15.5)
WBC: 12.5 10*3/uL — ABNORMAL HIGH (ref 4.0–10.5)
nRBC: 0 % (ref 0.0–0.2)

## 2023-02-25 LAB — PROTIME-INR
INR: 1.1 (ref 0.8–1.2)
Prothrombin Time: 13.9 seconds (ref 11.4–15.2)

## 2023-02-25 LAB — BASIC METABOLIC PANEL
Anion gap: 14 (ref 5–15)
BUN: 13 mg/dL (ref 6–20)
CO2: 27 mmol/L (ref 22–32)
Calcium: 9.7 mg/dL (ref 8.9–10.3)
Chloride: 92 mmol/L — ABNORMAL LOW (ref 98–111)
Creatinine, Ser: 1.33 mg/dL — ABNORMAL HIGH (ref 0.61–1.24)
GFR, Estimated: >60 mL/min (ref >60)
Glucose, Bld: 140 mg/dL — ABNORMAL HIGH (ref 70–99)
Potassium: 2.8 mmol/L — ABNORMAL LOW (ref 3.5–5.1)
Sodium: 133 mmol/L — ABNORMAL LOW (ref 135–145)

## 2023-02-25 MED ORDER — POTASSIUM CHLORIDE 10 MEQ/100ML IV SOLN
10.0000 meq | INTRAVENOUS | Status: AC
  Administered 2023-02-25 (×3): 10 meq via INTRAVENOUS
  Filled 2023-02-25 (×3): qty 100

## 2023-02-25 MED ORDER — NITROGLYCERIN 0.4 MG SL SUBL
0.4000 mg | SUBLINGUAL_TABLET | SUBLINGUAL | Status: DC | PRN
  Administered 2023-02-25: 0.4 mg via SUBLINGUAL
  Filled 2023-02-25: qty 1

## 2023-02-25 MED ORDER — OXYCODONE-ACETAMINOPHEN 5-325 MG PO TABS
1.0000 | ORAL_TABLET | Freq: Once | ORAL | Status: AC
  Administered 2023-02-25: 1 via ORAL
  Filled 2023-02-25: qty 1

## 2023-02-25 MED ORDER — POTASSIUM CHLORIDE CRYS ER 20 MEQ PO TBCR
30.0000 meq | EXTENDED_RELEASE_TABLET | Freq: Once | ORAL | Status: AC
  Administered 2023-02-25: 30 meq via ORAL
  Filled 2023-02-25: qty 1

## 2023-02-25 MED ORDER — IOHEXOL 350 MG/ML SOLN
100.0000 mL | Freq: Once | INTRAVENOUS | Status: AC | PRN
  Administered 2023-02-25: 100 mL via INTRAVENOUS

## 2023-02-25 NOTE — ED Provider Notes (Signed)
  Physical Exam  BP 137/86   Pulse (!) 57   Temp 97.7 F (36.5 C) (Oral)   Resp 16   Ht 6\' 1"  (1.854 m)   Wt 102.1 kg   SpO2 99%   BMI 29.70 kg/m   Physical Exam Vitals and nursing note reviewed.  Constitutional:      General: He is not in acute distress.    Appearance: Normal appearance.  HENT:     Mouth/Throat:     Mouth: Mucous membranes are moist.  Eyes:     Conjunctiva/sclera: Conjunctivae normal.  Cardiovascular:     Rate and Rhythm: Normal rate and regular rhythm.  Pulmonary:     Effort: Pulmonary effort is normal. No respiratory distress.     Breath sounds: Normal breath sounds.  Chest:     Chest wall: Tenderness (palpation of lower sternum and manubrium completely reproduces pain) present.  Abdominal:     General: Abdomen is flat.     Palpations: Abdomen is soft.     Tenderness: There is no abdominal tenderness.  Musculoskeletal:     Right lower leg: No edema.     Left lower leg: No edema.  Skin:    General: Skin is warm and dry.     Capillary Refill: Capillary refill takes less than 2 seconds.  Neurological:     Mental Status: He is alert and oriented to person, place, and time. Mental status is at baseline.  Psychiatric:        Mood and Affect: Mood normal.        Behavior: Behavior normal.     Procedures  Procedures  ED Course / MDM   Clinical Course as of 02/25/23 Sanjuana Kava Feb 25, 2023  1928 Patient well-appearing, he reports his symptoms have improved.  EKG appears similar to previous, no acute ST changes concerning for ischemia, does have Isolated T wave inversion in aVL, but was flat previously.  Symptoms completely reproducible on chest wall palpation.  Negative high-sensitivity troponin x 2.  CT angiography of the chest is negative for any acute intrathoracic or intra-abdominal pathology including dissection, pneumothorax.  No tachycardia, hypoxia to suggest a pulmonary embolism.  Patient does have prior history of coronary artery disease,  would benefit with close outpatient follow-up with his cardiologist although concern for cardiac cause is very low.  Placed expedited cardiology referral and discussed strict return precautions given his medical history.  His hypokalemia was repleted. Will discharge patient to home. All questions answered. Patient comfortable with plan of discharge. Return precautions discussed with patient and specified on the after visit summary. [WS]    Clinical Course User Index [WS] Lonell Grandchild, MD   Medical Decision Making Amount and/or Complexity of Data Reviewed Labs: ordered. Radiology: ordered.  Risk Prescription drug management.          Lonell Grandchild, MD 02/25/23 Barry Brunner

## 2023-02-25 NOTE — ED Notes (Signed)
Patient will be  transported to [facility] for continuity of care.. Receiving EDP  will be Dr. Tanda Rockers. Patient informed regarding transfer.

## 2023-02-25 NOTE — ED Triage Notes (Signed)
Pt states he was at work and broke out in a sweat and noticed his left arm was numb, then he felt a stabbing pain behind his sternum that radiates up his neck to the back of his head. Denies SOB, no nausea. Pt feels dizzy. Pain rated 7/10 substernal. Pt has had 3 cardiac stents placed, HTN, HLD

## 2023-02-25 NOTE — ED Triage Notes (Signed)
Transfer from [facility] for CTA. NSR in route to [facility]. Given 324mg  aspirin, total of of fentanyl in route to hospital. Potassium infusing currently.

## 2023-02-25 NOTE — Discharge Instructions (Addendum)
We evaluated you for your chest pain.  Your pain is most likely caused by inflammation in the muscles and ligaments of your chest wall.  We obtained 2 sets of high-sensitivity cardiac enzymes which were normal.  We also obtained a CT scan of your chest and abdomen which did not show any dangerous problems such as a problem with your aorta or lungs.  We think it is unlikely that your symptoms are due to a cardiac issue, we would still like you to follow-up with a cardiologist as soon as possible.  I have placed a referral for expedited follow-up.  They should call you in the next 1 to 2 days, but if you do not hear from anybody please call.  If you have any recurrent or severe symptoms or develop any new symptoms such as shortness of breath, nausea or vomiting, change in your pain, fainting, or any other new symptoms, please return to the emergency department so that we can obtain repeat testing and reassess you.

## 2023-02-25 NOTE — ED Notes (Signed)
Patient gave consent to transfer signature pad inoperable.

## 2023-02-25 NOTE — ED Provider Notes (Signed)
[facility] EMERGENCY DEPARTMENT AT [facility] Provider Note   CSN: [id number] Arrival date & time: 02/25/23  1253     History  Chief Complaint  Patient presents with   Chest Pain    Bruce King is a 52 y.o. 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  He describes lower sternal chest pain described as sharp stabbing pain which radiates into his back midline and into his lower neck region.  He broke out in a cold sweat and developed numbness in his left arm.  The symptoms have been intermittent since they began.  He has found no alleviators or exacerbators for his symptoms.  He denies shortness of breath, nausea or vomiting.  He does feel slightly lightheaded.  He has had no medications for his symptoms prior to arrival.  He is on Plavix last dose taken yesterday evening.  His symptoms are not similar to his pain associated with prior MI.   The history is provided by the patient.       Home Medications Prior to Admission medications   Medication Sig Start Date End Date Taking? Authorizing Provider  acetaminophen (TYLENOL) 500 MG tablet Take 1,000 mg by mouth every 6 (six) hours as needed.    [provider]  ALPRAZolam Prudy Feeler) 0.5 MG tablet 1 bid prn 09/06/22   Babs Sciara, MD  clopidogrel (PLAVIX) 75 MG tablet Take 1 tablet (75 mg total) by mouth daily. 06/08/22   Corky Crafts, MD  DULoxetine (CYMBALTA) 60 MG capsule 1 qd 09/06/22   Babs Sciara, MD  fenofibrate micronized (LOFIBRA) 134 MG capsule Take 1 capsule (134 mg total) by mouth daily before breakfast. 02/14/21 05/15/22  Corky Crafts, MD  metoprolol tartrate (LOPRESSOR) 50 MG tablet Take 1 tablet (50 mg total) by mouth  2 (two) times daily. 06/08/22   Corky Crafts, MD  nitroGLYCERIN (NITROSTAT) 0.4 MG SL tablet 1 every 5 minutes SL as needed chest discomfort maximum 3 call 911 if ongoing pain 10/30/19   Babs Sciara, MD  rosuvastatin (CRESTOR) 20 MG tablet TAKE ONE TABLET BY MOUTH EVERY DAY 06/08/22   Corky Crafts, MD  sildenafil (VIAGRA) 100 MG tablet Take 0.5-1 tablets (50-100 mg total) by mouth daily as needed for erectile dysfunction. 07/07/21   Babs Sciara, MD  valsartan (DIOVAN) 160 MG tablet TAKE ONE TABLET ONCE DAILY 08/20/22   Babs Sciara, MD  valsartan-hydrochlorothiazide (DIOVAN-HCT) 160-12.5 MG tablet Take 1 tablet by mouth daily. 09/06/22   Babs Sciara, MD      Allergies    Bactrim [sulfamethoxazole-trimethoprim], Lipitor [atorvastatin], and Pravastatin    Review of Systems   Review of Systems  Constitutional:  Negative for fever.  HENT:  Negative for congestion and sore throat.   Eyes: Negative.   Respiratory:  Negative for chest tightness and shortness of breath.   Cardiovascular:  Positive for chest pain.  Gastrointestinal:  Negative for abdominal pain and nausea.  Genitourinary: Negative.   Musculoskeletal:  Positive for back pain and neck pain. Negative for arthralgias and joint swelling.  Skin: Negative.  Negative for rash and wound.  Neurological:  Negative for dizziness, weakness, light-headedness, numbness and headaches.  Psychiatric/Behavioral: Negative.  All other systems reviewed and are negative.   Physical Exam Updated Vital Signs BP 136/88 (BP Location: Right Arm)   Pulse 77   Temp 98.1 F (36.7 C) (Oral)   Resp 13   Ht 6\' 1"  (1.854 m)   Wt 102.1 kg   SpO2 100%   BMI 29.69 kg/m  Physical Exam Vitals and nursing note reviewed.  Constitutional:      Appearance: He is well-developed.  HENT:     Head: Normocephalic and atraumatic.  Eyes:     Conjunctiva/sclera: Conjunctivae normal.  Cardiovascular:     Rate and Rhythm: Normal rate  and regular rhythm.     Heart sounds: Normal heart sounds.  Pulmonary:     Effort: Pulmonary effort is normal.     Breath sounds: Normal breath sounds. No wheezing.  Abdominal:     General: Bowel sounds are normal.     Palpations: Abdomen is soft.     Tenderness: There is no abdominal tenderness.  Musculoskeletal:        General: Normal range of motion.     Cervical back: Normal range of motion.  Skin:    General: Skin is warm and dry.  Neurological:     Mental Status: He is alert.     ED Results / Procedures / Treatments   Labs (all labs ordered are listed, but only abnormal results are displayed) Labs Reviewed  BASIC METABOLIC PANEL - Abnormal; Notable for the following components:      Result Value   Sodium 133 (*)    Potassium 2.8 (*)    Chloride 92 (*)    Glucose, Bld 140 (*)    Creatinine, Ser 1.33 (*)    All other components within normal limits  CBC - Abnormal; Notable for the following components:   WBC 12.5 (*)    MCHC 36.2 (*)    All other components within normal limits  PROTIME-INR  TROPONIN I (HIGH SENSITIVITY)  TROPONIN I (HIGH SENSITIVITY)    EKG None  Radiology DG Chest 2 View  Result Date: 02/25/2023 CLINICAL DATA:  Chest pain EXAM: CHEST - 2 VIEW COMPARISON:  09/28/2018 FINDINGS: The heart size and mediastinal contours are within normal limits. Both lungs are clear. The visualized skeletal structures are unremarkable. IMPRESSION: No active cardiopulmonary disease. Electronically Signed   By: Judie Petit.  Shick M.D.   On: 02/25/2023 13:30    Procedures Procedures    Medications Ordered in ED Medications  nitroGLYCERIN (NITROSTAT) SL tablet 0.4 mg (has no administration in time range)  potassium chloride 10 mEq in 100 mL IVPB (has no administration in time range)    ED Course/ Medical Decision Making/ A&P                             Medical Decision Making Patient presenting with sharp intermittent episodes of lower sternal chest pain radiating to  his mid back up into his neck and left shoulder along with intermittent episodes of numbness down his left arm.  Associated with diaphoresis, no nausea or vomiting, no shortness of breath.  Although he does have a history of CAD with prior MI, this is in the differential, his initial troponin is negative, EKG is stable.  Location and radiation of symptoms are concerning for possibility of thoracic aneurysm/dissection.  No shortness of breath, normal pulse ox, PE less likely.  Amount and/or Complexity of Data Reviewed Labs: ordered.    Details: Initial troponin  is negative at 11, his be met is significant for renal insufficiency with a creatinine 1.33, he has a potassium of 2.8.  He has a WBC count of 12.5. Radiology: ordered.    Details: Plain film imaging negative for acute cardiopulmonary process. Discussion of management or test interpretation with external provider(s): Unfortunately since we do not have CT imaging here, patient will need to be transferred to [facility] for a CT dissection study, this has been ordered.  Call placed to Dr. Chelsea Aus with the ED at [facility] who accepts patient for transfer.  Further disposition pending CT results.  Of note, patient's primary cardiologist is Dr  Eldridge Dace in [place].           Final Clinical Impression(s) / ED Diagnoses Final diagnoses:  Chest pain, unspecified type  Hypokalemia    Rx / DC Orders ED Discharge Orders     None         Victoriano Lain 02/25/23 1551    Bethann Berkshire, MD 02/28/23 [phone]

## 2023-02-26 ENCOUNTER — Other Ambulatory Visit: Payer: Self-pay | Admitting: Family Medicine

## 2023-02-26 ENCOUNTER — Encounter: Payer: Self-pay | Admitting: Interventional Cardiology

## 2023-02-26 NOTE — Progress Notes (Unsigned)
Office Visit    Patient Name: Bruce King Date of Encounter: 02/26/2023  Primary Care Provider:  Babs Sciara, MD Primary Cardiologist:  Lance Muss, MD Primary Electrophysiologist: None   Past Medical History    Past Medical History:  Diagnosis Date   Anxiety    Chest pain 09/2018   Coronary artery disease    Depression    GAD (generalized anxiety disorder)    GERD (gastroesophageal reflux disease)    History of hiatal hernia    Hyperlipidemia    NSTEMI (non-ST elevated myocardial infarction) (HCC) 10/30/2016   Past Surgical History:  Procedure Laterality Date   CORONARY ANGIOPLASTY WITH STENT PLACEMENT  10/31/2016   "3 stents"   CORONARY PRESSURE/FFR STUDY N/A 09/30/2018   Procedure: INTRAVASCULAR PRESSURE WIRE/FFR STUDY;  Surgeon: Marykay Lex, MD;  Location: [facility];  Service: Cardiovascular;  Laterality: N/A;   CORONARY STENT INTERVENTION N/A 10/31/2016   Procedure: Coronary Stent Intervention;  Surgeon: Iran Ouch, MD;  Location: [facility];  Service: Cardiovascular;  Laterality: N/A;   FRACTURE SURGERY     LEFT HEART CATH AND CORONARY ANGIOGRAPHY N/A 10/31/2016   Procedure: Left Heart Cath and Coronary Angiography;  Surgeon: Iran Ouch, MD;  Location: [facility];  Service: Cardiovascular;  Laterality: N/A;   LEFT HEART CATH AND CORONARY ANGIOGRAPHY N/A 09/30/2018   Procedure: LEFT HEART CATH AND CORONARY ANGIOGRAPHY;  Surgeon: Marykay Lex, MD;  Location: [facility] INVASIVE CV LAB;  Service: Cardiovascular;  Laterality: N/A;   ORIF TIBIA & FIBULA FRACTURES Right ~ 05/1996   "titanium rod"    Allergies  Allergies  Allergen Reactions   Bactrim [Sulfamethoxazole-Trimethoprim] Other (See Comments)    Felt Drunk   Lipitor [Atorvastatin] Other (See Comments)    Stiffness in joints   Pravastatin Other (See Comments)    stiffness     History of Present Illness    Bruce King  is a 52 year old male with a  PMH of CAD s/p NSTEMI 2018 treated with DES x 3 to the LAD, HTN, HLD, anxiety, tobacco abuse who presents today for post ED follow-up of chest pain.  Mr. Mackin was seen initially in 2018 for complaint of chest pain.  He was evaluated and found to have an NSTEMI and underwent LHC with 80% stenotic lesion in the LAD that was treated with DES x 3, 70% first diagonal lesion with mild diffuse disease in other vessels.  He was treated with DAPT with ASA and Brilinta.  He was seen in the ED 09/29/2018 with complaint of chest pain.  He reported pain radiating to his jaw that was improved with nitroglycerin and Nitropaste.  He underwent relook LHC that showed patent stents in the LAD and aggressive medical therapy was recommended.  He was last seen by Dr. Eldridge Dace on 04/2022 for follow-up.  He reported no angina and blood pressure was well-controlled at that time.  He was working on decreasing his tobacco abuse.  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  He reports breaking out in a cold sweat with stabbing pain radiating into his back and mid and lower neck region.  Initial troponins were negative and EKG showed no acute changes.  Chest x-ray showed no acute cardiopulmonary indications.  He was transferred to [facility] for further evaluation.  High-sensitivity troponins were negative x 2  and CT of the chest was negative for dissecting pathology and embolism.  He was found to be hypokalemic and was repleted with potassium infusions.  Since last being seen in the office patient reports***.  Patient denies chest pain, palpitations, dyspnea, PND, orthopnea, nausea, vomiting, dizziness, syncope, edema, weight gain, or early satiety.     ***Notes:  Home Medications    Current Outpatient Medications  Medication Sig Dispense Refill   acetaminophen (TYLENOL) 500 MG tablet Take 1,000 mg by mouth every 6 (six) hours as needed.      ALPRAZolam (XANAX) 0.5 MG tablet 1 bid prn 60 tablet 5   clopidogrel (PLAVIX) 75 MG tablet Take 1 tablet (75 mg total) by mouth daily. 90 tablet 3   DULoxetine (CYMBALTA) 60 MG capsule 1 qd 30 capsule 5   fenofibrate micronized (LOFIBRA) 134 MG capsule Take 1 capsule (134 mg total) by mouth daily before breakfast. 30 capsule 11   metoprolol tartrate (LOPRESSOR) 50 MG tablet Take 1 tablet (50 mg total) by mouth 2 (two) times daily. 180 tablet 3   nitroGLYCERIN (NITROSTAT) 0.4 MG SL tablet 1 every 5 minutes SL as needed chest discomfort maximum 3 call 911 if ongoing pain 25 tablet 3   rosuvastatin (CRESTOR) 20 MG tablet TAKE ONE TABLET BY MOUTH EVERY DAY 90 tablet 3   sildenafil (VIAGRA) 100 MG tablet Take 0.5-1 tablets (50-100 mg total) by mouth daily as needed for erectile dysfunction. 10 tablet 4   valsartan (DIOVAN) 160 MG tablet TAKE ONE TABLET ONCE DAILY 90 tablet 0   valsartan-hydrochlorothiazide (DIOVAN-HCT) 160-12.5 MG tablet TAKE ONE TABLET BY MOUTH DAILY 90 tablet 0   No current facility-administered medications for this visit.     Review of Systems  Please see the history of present illness.    (+)*** (+)***  All other systems reviewed and are otherwise negative except as noted above.  Physical Exam    Wt Readings from Last 3 Encounters:  02/25/23 225 lb 1.4 oz (102.1 kg)  09/06/22 230 lb 14.4 oz (104.7 kg)  05/15/22 224 lb (101.6 kg)   JY:NWGNF were no vitals filed for this visit.,There is no height or weight on file to calculate BMI.  Constitutional:      Appearance: Healthy appearance. Not in distress.  Neck:     Vascular: JVD normal.  Pulmonary:     Effort: Pulmonary effort is normal.     Breath sounds: No wheezing. No rales. Diminished in the bases Cardiovascular:     Normal rate. Regular rhythm. Normal S1. Normal S2.      Murmurs: There is no murmur.  Edema:    Peripheral edema absent.  Abdominal:     Palpations: Abdomen is soft non tender. There is no  hepatomegaly.  Skin:    General: Skin is warm and dry.  Neurological:     General: No focal deficit present.     Mental Status: Alert and oriented to person, place and time.     Cranial Nerves: Cranial nerves are intact.  EKG/LABS/ Recent Cardiac Studies    ECG personally reviewed by me today - ***  Cardiac Studies & Procedures   CARDIAC CATHETERIZATION  CARDIAC CATHETERIZATION 09/30/2018  Narrative Images from the original result were not included.   Mid Cx lesion is 60% stenosed. FFR 0.84  Previously placed Prox LAD to Mid LAD DES stents (overlapping resolute Onyx DES stents) are widely patent.  Previously placed Dist LAD DES stent (resolute Onyx) is widely patent.  JAILED  DIAGONAL BRANCES: Ost 1st Diag lesion is 50% stenosed. Ost 2nd Diag lesion is 55% stenosed. Both improved from prior cath post PCI  Post Atrio lesion is 45% stenosed.  LV end diastolic pressure is normal. The left ventricular ejection fraction is 50-55% by visual estimate.  SUMMARY  Widely patent stents in LAD with mild disease elsewhere.  Improved flow through jailed diagonal branches.  Moderate disease in mCx (~60% - FFR 0.84)  Moderate disease in small caliber 2nd Diag prior to Bifurcation - not PCI target  Normal LVEDP  RECOMMENDATIONS  Return to nursing unit for ongoing care and TR band removal (per PCI protocol because of extra heparin).  Anticipate discharge later today.  Continue aggressive medical management.    Bryan Lemma, M.D., M.S. Interventional Cardiologist  Pager # [phone] Phone # [phone] [address] [place], [place] [address]  Findings Coronary Findings Diagnostic  Dominance: Right  Left Main Vessel is normal in caliber.  Left Anterior Descending Previously placed Prox LAD to Mid LAD stent (unknown type) is widely patent. Overlapping Resolute Onyx DES 2.5 mm x 30 mm &amp; 2.25 mm x 15 mm - post-dilated 3.0 - 2.75 mm Previously placed Mid  LAD to Dist LAD stent (unknown type) is widely patent. Resolute Onyx DES 2.25 mm x 12 mm (2.4 mm)  First Diagonal Branch Vessel is small in size. Ost 1st Diag lesion is 50% stenosed. The lesion is concentric and irregular.  First Septal Branch Vessel is small in size.  Second Diagonal Branch Vessel is small in size. Begins as a moderate caliber vessel &amp; tapers to a small caliber vessel as it bifurcates Ost 2nd Diag lesion is 55% stenosed. The lesion is concentric and irregular.  Second Septal Branch Vessel is small in size.  Third Diagonal Branch Vessel is small in size.  Third Septal Branch Vessel is small in size.  Left Circumflex Mid Cx lesion is 60% stenosed. The lesion is located at the bend and concentric. Pressure wire/FFR was performed on the lesion. FFR: 0.84. 6 Fr JL 3.5 Guide - [place]et FFR wire -- DFR 0.93-0.94, therefore FFR was chosen and adenosine was done for 2:45 minutes.  First Obtuse Marginal Branch Vessel is small in size.  Second Obtuse Marginal Branch Vessel is small in size.  Left Posterior Atrioventricular Artery Vessel is small in size.  Right Coronary Artery There is mild diffuse disease throughout the vessel.  Acute Marginal Branch Vessel is small in size.  Right Posterior Descending Artery Vessel is small in size.  Right Posterior Atrioventricular Artery Post Atrio lesion is 45% stenosed. The lesion is located at the bifurcation.  First Right Posterolateral Branch Vessel is small in size.  Intervention  No interventions have been documented.   CARDIAC CATHETERIZATION  CARDIAC CATHETERIZATION 10/31/2016  Narrative  Mid RCA lesion, 20 %stenosed.  Ost RPDA lesion, 30 %stenosed.  2nd RPLB lesion, 40 %stenosed.  Ost 2nd Mrg to 2nd Mrg lesion, 30 %stenosed.  Ost 2nd Diag to 2nd Diag lesion, 30 %stenosed.  Ost 1st Sept lesion, 70 %stenosed.  Ost 1st Diag lesion, 80 %stenosed.  The left ventricular systolic function is  normal.  LV end diastolic pressure is mildly elevated.  The left ventricular ejection fraction is 50-55% by visual estimate.  A STENT RESOLUTE ONYX 2.25X12 drug eluting stent was successfully placed.  Dist LAD lesion, 70 %stenosed.  Post intervention, there is a 0% residual stenosis.  A STENT RESOLUTE ONYX 2.25X15 drug eluting stent was successfully placed, and overlaps  previously placed stent.  Mid LAD to Dist LAD lesion, 70 %stenosed.  Post intervention, there is a 0% residual stenosis.  A STENT RESOLUTE ONYX 2.5X30 drug eluting stent was successfully placed.  Mid LAD lesion, 95 %stenosed.  Post intervention, there is a 0% residual stenosis.  1. Severe one-vessel coronary artery disease with multiple areas of stenosis in the mid and distal LAD. The vessel is moderately calcified overall. 2. Normal LV systolic function and mildly elevated left ventricular end-diastolic pressure. 3. Successful angioplasty and 3 drug-eluting stent placements to the LAD. There is one distal LAD stent and 2 overlapped stents in the midsegment.   Recommendations: Dual antiplatelet therapy for at least one year. Aggressive treatment of risk factors and smoking cessation. I started Aggrastat and will continue for 12 hours due to thrombus formation in the mid LAD after development of distal edge dissection. This was an overall difficult procedure due to diffuse disease in the LAD and moderate calcifications.  Findings Coronary Findings Diagnostic  Dominance: Right  Left Main Vessel is angiographically normal.  Left Anterior Descending The lesion is mildly calcified.  First Occupational psychologist  Second Diagonal Branch  Third Diagonal Branch The vessel exhibits minimal luminal irregularities.  Ramus Intermedius Vessel is small. The vessel exhibits minimal luminal irregularities.  Left Circumflex There is mild the vessel.  First Obtuse Marginal Branch Vessel is small in  size. There is mild disease in the vessel.  Second Obtuse Marginal Branch  Third Obtuse Marginal Branch Vessel is small in size. Vessel is angiographically normal.  Right Coronary Artery There is mild the vessel.  Right Posterior Descending Artery  Right Posterior Atrioventricular Artery There is mild disease in the vessel.  First Right Posterolateral Branch There is mild disease in the vessel.  Second Right Posterolateral Branch  Intervention  Mid LAD lesion Angioplasty (Also treats lesions: Mid LAD to Dist LAD) A STENT RESOLUTE ONYX 2.5X30 drug eluting stent was successfully placed. Stent strut is well apposed. Post-stent angioplasty was performed. The pre-interventional distal flow is normal (TIMI 3).  The post-interventional distal flow is normal (TIMI 3). The intervention was successful . No complications occurred at this lesion. There is a 0% residual stenosis post intervention.  Mid LAD to Dist LAD lesion Angioplasty A STENT RESOLUTE ONYX 2.25X15 drug eluting stent was successfully placed, and overlaps previously placed stent. Post-stent angioplasty was performed. The pre-interventional distal flow is normal (TIMI 3).  The post-interventional distal flow is normal (TIMI 3). The intervention was successful . No complications occurred at this lesion. Angioplasty (Also treats lesions: Mid LAD) See details in Mid LAD lesion. There is a 0% residual stenosis post intervention.  Dist LAD lesion Angioplasty Pre-stent angioplasty was not performed. A STENT RESOLUTE ONYX 2.25X12 drug eluting stent was successfully placed. The pre-interventional distal flow is normal (TIMI 3).  The post-interventional distal flow is normal (TIMI 3). The intervention was successful . No complications occurred at this lesion. There is a 0% residual stenosis post intervention.     ECHOCARDIOGRAM  ECHOCARDIOGRAM COMPLETE 11/01/2016  Narrative *[place]* *[facility]* [address] [place], [place] [address] [phone]  ------------------------------------------------------------------- Transthoracic Echocardiography  Patient:    Marguerite, Medine MR #:       [id number] Study Date: 11/01/2016 Gender:     M Age:        46 Height:     185.4 cm Weight:     99.2 kg BSA:  2.28 m^2 Pt. Status: Room:       6C05C  SONOGRAPHER  Cathie Beams [facility]     Lorine Bears, MD ADMITTING    Lorretta Harp [id number] ATTENDING    Drema Dallas PERFORMING   [facility], Inpatient  cc:  ------------------------------------------------------------------- LV EF: 55% -   60%  ------------------------------------------------------------------- Indications:      Chest pain 786.51.  ------------------------------------------------------------------- History:   PMH:   Coronary artery disease.  PMH:   Myocardial infarction.  Risk factors:  Current tobacco use. Dyslipidemia.  ------------------------------------------------------------------- Study Conclusions  - Left ventricle: The cavity size was normal. Wall thickness was normal. Systolic function was normal. The estimated ejection fraction was in the range of 55% to 60%. Wall motion was normal; there were no regional wall motion abnormalities. There was an increased relative contribution of atrial contraction to ventricular filling. Doppler parameters are consistent with abnormal left ventricular relaxation (grade 1 diastolic dysfunction).  ------------------------------------------------------------------- Study data:   Study status:  Routine.  Procedure:  Transthoracic echocardiography. Image quality was adequate. Intravenous contrast (Definity) was administered.  Study completion:  There were no complications.          Transthoracic echocardiography.  M-mode, complete 2D, spectral Doppler, and color Doppler.  Birthdate: Patient birthdate: Nov 16, 1970.  Age:  Patient is 52 yr old.  Sex: Gender: male.    BMI:  28.9 kg/m^2.  Blood pressure:     155/89 Patient status:  Inpatient.  Study date:  Study date: 11/01/2016. Study time: 08:04 AM.  Location:  Echo laboratory.  -------------------------------------------------------------------  ------------------------------------------------------------------- Left ventricle:  The cavity size was normal. Wall thickness was normal. Systolic function was normal. The estimated ejection fraction was in the range of 55% to 60%. Wall motion was normal; there were no regional wall motion abnormalities. There was an increased relative contribution of atrial contraction to ventricular filling. The pulmonary vein flow pattern was normal. Doppler parameters are consistent with abnormal left ventricular relaxation (grade 1 diastolic dysfunction). There was no evidence of elevated ventricular filling pressure by Doppler parameters.  ------------------------------------------------------------------- Aortic valve:   Structurally normal valve. Trileaflet. Cusp separation was normal.  Doppler:  Transvalvular velocity was within the normal range. There was no stenosis. There was no regurgitation.  ------------------------------------------------------------------- Aorta:  Aortic root: The aortic root was normal in size. Ascending aorta: The ascending aorta was normal in size.  ------------------------------------------------------------------- Mitral valve:   Structurally normal valve.   Leaflet separation was normal.  Doppler:  Transvalvular velocity was within the normal range. There was no evidence for stenosis. There was no regurgitation.  ------------------------------------------------------------------- Left atrium:  The atrium was normal in size.  ------------------------------------------------------------------- Right ventricle:  The cavity size was normal. Systolic function  was normal.  ------------------------------------------------------------------- Pulmonic valve:   Poorly visualized.  The valve appears to be grossly normal.    Doppler:  Transvalvular velocity was within the normal range. There was no evidence for stenosis. There was no significant regurgitation.  ------------------------------------------------------------------- Tricuspid valve:   Structurally normal valve.   Leaflet separation was normal.  Doppler:  Transvalvular velocity was within the normal range. There was no regurgitation.  ------------------------------------------------------------------- Pulmonary artery:    Systolic pressure could not be accurately estimated.  ------------------------------------------------------------------- Right atrium:  The atrium was normal in size.  ------------------------------------------------------------------- Pericardium:  There was no pericardial effusion.  ------------------------------------------------------------------- Systemic veins: Inferior vena cava: The vessel was normal in size. The respirophasic diameter changes were in the normal range (>= 50%), consistent with normal central venous pressure.  -------------------------------------------------------------------  Measurements  Left ventricle                           Value        Reference LV ID, ED, PLAX chordal                  45.7  mm     43 - 52 LV ID, ES, PLAX chordal                  28.6  mm     23 - 38 LV fx shortening, PLAX chordal           37    %      >=29 LV PW thickness, ED                      8.95  mm     ---------- IVS/LV PW ratio, ED            (H)       1.52         <=1.3 LV e&', lateral                           12.3  cm/s   ---------- LV E/e&', lateral                         5.72         ---------- LV e&', medial                            8.49  cm/s   ---------- LV E/e&', medial                          8.28         ---------- LV e&', average                            10.4  cm/s   ---------- LV E/e&', average                         6.76         ----------  Ventricular septum                       Value        Reference IVS thickness, ED                        13.6  mm     ----------  Aorta                                    Value        Reference Aortic root ID, ED                       37    mm     ----------  Left atrium                              Value  Reference LA ID, A-P, ES                           30    mm     ---------- LA ID/bsa, A-P                           1.32  cm/m^2 <=2.2 LA volume, S                             31.7  ml     ---------- LA volume/bsa, S                         13.9  ml/m^2 ---------- LA volume, ES, 1-p A4C                   27.5  ml     ---------- LA volume/bsa, ES, 1-p A4C               12.1  ml/m^2 ---------- LA volume, ES, 1-p A2C                   34.3  ml     ---------- LA volume/bsa, ES, 1-p A2C               15    ml/m^2 ----------  Mitral valve                             Value        Reference Mitral E-wave peak velocity              70.3  cm/s   ---------- Mitral A-wave peak velocity              86.8  cm/s   ---------- Mitral deceleration time       (H)       419   ms     150 - 230 Mitral E/A ratio, peak                   0.9          ----------  Right atrium                             Value        Reference RA ID, S-I, ES, A4C            (H)       57.7  mm     34 - 49 RA area, ES, A4C                         15.6  cm^2   8.3 - 19.5 RA volume, ES, A/L                       34.6  ml     ---------- RA volume/bsa, ES, A/L                   15.2  ml/m^2 ----------  Right ventricle                          Value  Reference RV s&', lateral, S                        11.4  cm/s   ----------  Legend: (L)  and  (H)  mark values outside specified reference range.  ------------------------------------------------------------------- Prepared and Electronically Authenticated  by  Thurmon Fair, MD 2018-02-08T11:54:15             Risk Assessment/Calculations:   {Does this patient have ATRIAL FIBRILLATION?:[id number]}        Lab Results  Component Value Date   WBC 12.5 (H) 02/25/2023   HGB 17.0 02/25/2023   HCT 47.0 02/25/2023   MCV 87.5 02/25/2023   PLT 296 02/25/2023   Lab Results  Component Value Date   CREATININE 1.33 (H) 02/25/2023   BUN 13 02/25/2023   NA 133 (L) 02/25/2023   K 2.8 (L) 02/25/2023   CL 92 (L) 02/25/2023   CO2 27 02/25/2023   Lab Results  Component Value Date   ALT 67 (H) 01/18/2023   AST 46 (H) 01/18/2023   ALKPHOS 107 01/18/2023   BILITOT 0.5 01/18/2023   Lab Results  Component Value Date   CHOL 123 01/18/2023   HDL 23 (L) 01/18/2023   LDLCALC 49 01/18/2023   TRIG 335 (H) 01/18/2023   CHOLHDL 5.3 (H) 01/18/2023    No results found for: "HGBA1C"   Assessment & Plan    1.  Coronary artery disease: -s/p NSTEMI 2018 with DES x 3 to LAD and redeveloped LHC in 2019 with patent stent.  Patient [facility] ED at [facility] with complaint of chest pain and troponins were negative x 3 with no evidence of ACS. -Today patient reports*** -Continue***  2.  Chest pain: -Today patient reports***  3.  Essential hypertension: -Patient's blood pressure today was*** -Continue***  4.  Hyperlipidemia: -Patient's last LDL cholesterol was*** -Continue***  5.  Tobacco abuse: -Today patient reports***      Disposition: Follow-up with Lance Muss, MD or APP in *** months {Are you ordering a CV Procedure (e.g. stress test, cath, DCCV, TEE, etc)?   Press F2        :[id number]}   Medication Adjustments/Labs and Tests Ordered: Current medicines are reviewed at length with the patient today.  Concerns regarding medicines are outlined above.   Signed, Napoleon Form, Leodis Rains, NP 02/26/2023, 7:35 PM [facility]

## 2023-02-27 ENCOUNTER — Encounter: Payer: Self-pay | Admitting: Nurse Practitioner

## 2023-02-27 ENCOUNTER — Ambulatory Visit: Payer: Self-pay | Attending: Nurse Practitioner | Admitting: Nurse Practitioner

## 2023-02-27 VITALS — BP 110/70 | HR 90 | Ht 73.0 in | Wt 221.2 lb

## 2023-02-27 DIAGNOSIS — E782 Mixed hyperlipidemia: Secondary | ICD-10-CM

## 2023-02-27 DIAGNOSIS — I200 Unstable angina: Secondary | ICD-10-CM

## 2023-02-27 DIAGNOSIS — Z720 Tobacco use: Secondary | ICD-10-CM

## 2023-02-27 DIAGNOSIS — I25118 Atherosclerotic heart disease of native coronary artery with other forms of angina pectoris: Secondary | ICD-10-CM

## 2023-02-27 DIAGNOSIS — I10 Essential (primary) hypertension: Secondary | ICD-10-CM

## 2023-02-27 MED FILL — Fentanyl Citrate Preservative Free (PF) Inj 100 MCG/2ML: INTRAMUSCULAR | Qty: 2 | Status: AC

## 2023-02-27 NOTE — Patient Instructions (Signed)
Medication Instructions:  Your physician recommends that you continue on your current medications as directed. Please refer to the Current Medication list given to you today. *If you need a refill on your cardiac medications before your next appointment, please call your pharmacy*   Lab Work: BMET in 2 WEEKS  If you have labs (blood work) drawn today and your tests are completely normal, you will receive your results only by: MyChart Message (if you have MyChart) OR A paper copy in the mail If you have any lab test that is abnormal or we need to change your treatment, we will call you to review the results.   Testing/Procedures: NONE ORDERED   Follow-Up: At [facility], you and your health needs are our priority.  As part of our continuing mission to provide you with exceptional heart care, we have created designated Provider Care Teams.  These Care Teams include your primary Cardiologist (physician) and Advanced Practice Providers (APPs -  Physician Assistants and Nurse Practitioners) who all work together to provide you with the care you need, when you need it.  We recommend signing up for the patient portal called "MyChart".  Sign up information is provided on this After Visit Summary.  MyChart is used to connect with patients for Virtual Visits (Telemedicine).  Patients are able to view lab/test results, encounter notes, upcoming appointments, etc.  Non-urgent messages can be sent to your provider as well.   To learn more about what you can do with MyChart, go to [email or web address].    Your next appointment:    FOLLOW UP AS SCHEDULED  Provider:   Lance Muss, MD     Other Instructions

## 2023-03-29 ENCOUNTER — Other Ambulatory Visit: Payer: Self-pay | Admitting: Family Medicine

## 2023-03-29 ENCOUNTER — Encounter: Payer: Self-pay | Admitting: Family Medicine

## 2023-05-01 ENCOUNTER — Other Ambulatory Visit: Payer: Self-pay | Admitting: Family Medicine

## 2023-06-07 ENCOUNTER — Encounter: Payer: Self-pay | Admitting: Family Medicine

## 2023-06-07 ENCOUNTER — Ambulatory Visit (INDEPENDENT_AMBULATORY_CARE_PROVIDER_SITE_OTHER): Payer: Self-pay | Admitting: Family Medicine

## 2023-06-07 VITALS — BP 128/64 | HR 69 | Temp 97.7°F | Wt 225.4 lb

## 2023-06-07 DIAGNOSIS — F411 Generalized anxiety disorder: Secondary | ICD-10-CM

## 2023-06-07 DIAGNOSIS — Z23 Encounter for immunization: Secondary | ICD-10-CM

## 2023-06-07 DIAGNOSIS — E876 Hypokalemia: Secondary | ICD-10-CM

## 2023-06-07 DIAGNOSIS — R748 Abnormal levels of other serum enzymes: Secondary | ICD-10-CM

## 2023-06-07 DIAGNOSIS — E782 Mixed hyperlipidemia: Secondary | ICD-10-CM

## 2023-06-07 NOTE — Progress Notes (Unsigned)
Subjective:    Patient ID: Bruce King, male    DOB: 01/10/1971, 52 y.o.   MRN: [id number]  HPI Patient coming in for back and hip pain. Patient states he has been having pain for 6 months. No further concerns.  Pain goes from the hip into the leg into the foot some numbness denies significant weakness denies falling Has underlying anxiety related issues Smokes Has been counseled to quit   Review of Systems     Objective:   Physical Exam  General-in no acute distress Eyes-no discharge Lungs-respiratory rate normal, CTA CV-no murmurs,RRR Extremities skin warm dry no edema Neuro grossly normal Behavior normal, alert  Positive sciatica right side slight weakness in extensor great toe the rest of the foot exam is normal the rest of the leg exam is normal     Assessment & Plan:   Smoker-has been counseled to quit Strong anxiety related issues May continue Xanax.  Patient thinking about restarting sertraline I told him it would be a good idea 50 mg daily Also sciatica if he progresses with any weakness highly recommend orthopedics and neurosurgery currently right now he does not want to do this due to lack of insurance  At the minimum follow-up in 5 to 6 months follow-up sooner problems

## 2023-06-08 MED ORDER — VALSARTAN-HYDROCHLOROTHIAZIDE 160-12.5 MG PO TABS: 1.0000 | ORAL_TABLET | Freq: Every day | ORAL | 0 refills | Status: DC

## 2023-06-08 MED ORDER — ALPRAZOLAM 0.5 MG PO TABS: ORAL_TABLET | ORAL | 5 refills | Status: DC

## 2023-06-08 MED ORDER — SERTRALINE HCL 50 MG PO TABS: 50.0000 mg | ORAL_TABLET | Freq: Every day | ORAL | 5 refills | Status: DC

## 2023-06-27 ENCOUNTER — Telehealth (HOSPITAL_COMMUNITY): Payer: Self-pay | Admitting: Licensed Clinical Social Worker

## 2023-06-27 ENCOUNTER — Ambulatory Visit: Payer: Self-pay | Attending: Interventional Cardiology | Admitting: Internal Medicine

## 2023-06-27 ENCOUNTER — Encounter: Payer: Self-pay | Admitting: Internal Medicine

## 2023-06-27 VITALS — BP 110/68 | HR 69 | Ht 73.0 in | Wt 224.0 lb

## 2023-06-27 DIAGNOSIS — I25118 Atherosclerotic heart disease of native coronary artery with other forms of angina pectoris: Secondary | ICD-10-CM

## 2023-06-27 DIAGNOSIS — E876 Hypokalemia: Secondary | ICD-10-CM

## 2023-06-27 DIAGNOSIS — E782 Mixed hyperlipidemia: Secondary | ICD-10-CM

## 2023-06-27 DIAGNOSIS — Z720 Tobacco use: Secondary | ICD-10-CM

## 2023-06-27 MED ORDER — METOPROLOL TARTRATE 25 MG PO TABS: 25.0000 mg | ORAL_TABLET | Freq: Two times a day (BID) | ORAL | 3 refills | Status: DC

## 2023-06-27 NOTE — Progress Notes (Signed)
Marginal Branch Vessel is small in size. Vessel is angiographically normal.  Right Coronary Artery There is mild the vessel.  Right Posterior Descending Artery  Right Posterior Atrioventricular Artery There is mild disease in the vessel.  First Right Posterolateral Branch There is mild disease in the vessel.  Second Right Posterolateral Branch  Intervention  Mid LAD lesion Angioplasty (Also treats lesions: Mid LAD to Dist LAD) A STENT RESOLUTE ONYX 2.5X30 drug eluting stent was successfully  placed. Stent strut is well apposed. Post-stent angioplasty was performed. The pre-interventional distal flow is normal (TIMI 3).  The post-interventional distal flow is normal (TIMI 3). The intervention was successful . No complications occurred at this lesion. There is a 0% residual stenosis post intervention.  Mid LAD to Dist LAD lesion Angioplasty A STENT RESOLUTE ONYX 2.25X15 drug eluting stent was successfully placed, and overlaps previously placed stent. Post-stent angioplasty was performed. The pre-interventional distal flow is normal (TIMI 3).  The post-interventional distal flow is normal (TIMI 3). The intervention was successful . No complications occurred at this lesion. Angioplasty (Also treats lesions: Mid LAD) See details in Mid LAD lesion. There is a 0% residual stenosis post intervention.  Dist LAD lesion Angioplasty Pre-stent angioplasty was not performed. A STENT RESOLUTE ONYX 2.25X12 drug eluting stent was successfully placed. The pre-interventional distal flow is normal (TIMI 3).  The post-interventional distal flow is normal (TIMI 3). The intervention was successful . No complications occurred at this lesion. There is a 0% residual stenosis post intervention.     ECHOCARDIOGRAM  ECHOCARDIOGRAM COMPLETE 11/01/2016  Narrative *[place]* *[facility]* [address] [place], [place] [address] [phone]  ------------------------------------------------------------------- Transthoracic Echocardiography  Patient:    Bruce King, Bruce King MR #:       [id number] Study Date: 11/01/2016 Gender:     M Age:        52 Height:     185.4 cm Weight:     99.2 kg BSA:        2.28 m^2 Pt. Status: Room:       6C05C  SONOGRAPHER  [facility]     Lorine Bears, MD ADMITTING    Lorretta Harp [phone] ATTENDING    Drema Dallas PERFORMING   Chmg, Inpatient  cc:  ------------------------------------------------------------------- LV EF: 55% -    60%  ------------------------------------------------------------------- Indications:      Chest pain 786.51.  ------------------------------------------------------------------- History:   PMH:   Coronary artery disease.  PMH:   Myocardial infarction.  Risk factors:  Current tobacco use. Dyslipidemia.  ------------------------------------------------------------------- Study Conclusions  - Left ventricle: The cavity size was normal. Wall thickness was normal. Systolic function was normal. The estimated ejection fraction was in the range of 55% to 60%. Wall motion was normal; there were no regional wall motion abnormalities. There was an increased relative contribution of atrial contraction to ventricular filling. Doppler parameters are consistent with abnormal left ventricular relaxation (grade 1 diastolic dysfunction).  ------------------------------------------------------------------- Study data:   Study status:  Routine.  Procedure:  Transthoracic echocardiography. Image quality was adequate. Intravenous contrast (Definity) was administered.  Study completion:  There were no complications.          Transthoracic echocardiography.  M-mode, complete 2D, spectral Doppler, and color Doppler.  Birthdate: Patient birthdate: 03-13-1971.  Age:  Patient is 52 yr old.  Sex: Gender: male.    BMI: 28.9 kg/m^2.  Blood pressure:     155/89 Patient status:  Inpatient.  Study date:  Study date: 11/01/2016.  Marginal Branch Vessel is small in size. Vessel is angiographically normal.  Right Coronary Artery There is mild the vessel.  Right Posterior Descending Artery  Right Posterior Atrioventricular Artery There is mild disease in the vessel.  First Right Posterolateral Branch There is mild disease in the vessel.  Second Right Posterolateral Branch  Intervention  Mid LAD lesion Angioplasty (Also treats lesions: Mid LAD to Dist LAD) A STENT RESOLUTE ONYX 2.5X30 drug eluting stent was successfully  placed. Stent strut is well apposed. Post-stent angioplasty was performed. The pre-interventional distal flow is normal (TIMI 3).  The post-interventional distal flow is normal (TIMI 3). The intervention was successful . No complications occurred at this lesion. There is a 0% residual stenosis post intervention.  Mid LAD to Dist LAD lesion Angioplasty A STENT RESOLUTE ONYX 2.25X15 drug eluting stent was successfully placed, and overlaps previously placed stent. Post-stent angioplasty was performed. The pre-interventional distal flow is normal (TIMI 3).  The post-interventional distal flow is normal (TIMI 3). The intervention was successful . No complications occurred at this lesion. Angioplasty (Also treats lesions: Mid LAD) See details in Mid LAD lesion. There is a 0% residual stenosis post intervention.  Dist LAD lesion Angioplasty Pre-stent angioplasty was not performed. A STENT RESOLUTE ONYX 2.25X12 drug eluting stent was successfully placed. The pre-interventional distal flow is normal (TIMI 3).  The post-interventional distal flow is normal (TIMI 3). The intervention was successful . No complications occurred at this lesion. There is a 0% residual stenosis post intervention.     ECHOCARDIOGRAM  ECHOCARDIOGRAM COMPLETE 11/01/2016  Narrative *[place]* *[facility]* [address] [place], [place] [address] [phone]  ------------------------------------------------------------------- Transthoracic Echocardiography  Patient:    Bruce King, Bruce King MR #:       [id number] Study Date: 11/01/2016 Gender:     M Age:        52 Height:     185.4 cm Weight:     99.2 kg BSA:        2.28 m^2 Pt. Status: Room:       6C05C  SONOGRAPHER  [facility]     Lorine Bears, MD ADMITTING    Lorretta Harp [phone] ATTENDING    Drema Dallas PERFORMING   Chmg, Inpatient  cc:  ------------------------------------------------------------------- LV EF: 55% -    60%  ------------------------------------------------------------------- Indications:      Chest pain 786.51.  ------------------------------------------------------------------- History:   PMH:   Coronary artery disease.  PMH:   Myocardial infarction.  Risk factors:  Current tobacco use. Dyslipidemia.  ------------------------------------------------------------------- Study Conclusions  - Left ventricle: The cavity size was normal. Wall thickness was normal. Systolic function was normal. The estimated ejection fraction was in the range of 55% to 60%. Wall motion was normal; there were no regional wall motion abnormalities. There was an increased relative contribution of atrial contraction to ventricular filling. Doppler parameters are consistent with abnormal left ventricular relaxation (grade 1 diastolic dysfunction).  ------------------------------------------------------------------- Study data:   Study status:  Routine.  Procedure:  Transthoracic echocardiography. Image quality was adequate. Intravenous contrast (Definity) was administered.  Study completion:  There were no complications.          Transthoracic echocardiography.  M-mode, complete 2D, spectral Doppler, and color Doppler.  Birthdate: Patient birthdate: 03-13-1971.  Age:  Patient is 52 yr old.  Sex: Gender: male.    BMI: 28.9 kg/m^2.  Blood pressure:     155/89 Patient status:  Inpatient.  Study date:  Study date: 11/01/2016.  Marginal Branch Vessel is small in size. Vessel is angiographically normal.  Right Coronary Artery There is mild the vessel.  Right Posterior Descending Artery  Right Posterior Atrioventricular Artery There is mild disease in the vessel.  First Right Posterolateral Branch There is mild disease in the vessel.  Second Right Posterolateral Branch  Intervention  Mid LAD lesion Angioplasty (Also treats lesions: Mid LAD to Dist LAD) A STENT RESOLUTE ONYX 2.5X30 drug eluting stent was successfully  placed. Stent strut is well apposed. Post-stent angioplasty was performed. The pre-interventional distal flow is normal (TIMI 3).  The post-interventional distal flow is normal (TIMI 3). The intervention was successful . No complications occurred at this lesion. There is a 0% residual stenosis post intervention.  Mid LAD to Dist LAD lesion Angioplasty A STENT RESOLUTE ONYX 2.25X15 drug eluting stent was successfully placed, and overlaps previously placed stent. Post-stent angioplasty was performed. The pre-interventional distal flow is normal (TIMI 3).  The post-interventional distal flow is normal (TIMI 3). The intervention was successful . No complications occurred at this lesion. Angioplasty (Also treats lesions: Mid LAD) See details in Mid LAD lesion. There is a 0% residual stenosis post intervention.  Dist LAD lesion Angioplasty Pre-stent angioplasty was not performed. A STENT RESOLUTE ONYX 2.25X12 drug eluting stent was successfully placed. The pre-interventional distal flow is normal (TIMI 3).  The post-interventional distal flow is normal (TIMI 3). The intervention was successful . No complications occurred at this lesion. There is a 0% residual stenosis post intervention.     ECHOCARDIOGRAM  ECHOCARDIOGRAM COMPLETE 11/01/2016  Narrative *[place]* *[facility]* [address] [place], [place] [address] [phone]  ------------------------------------------------------------------- Transthoracic Echocardiography  Patient:    Bruce King, Bruce King MR #:       [id number] Study Date: 11/01/2016 Gender:     M Age:        52 Height:     185.4 cm Weight:     99.2 kg BSA:        2.28 m^2 Pt. Status: Room:       6C05C  SONOGRAPHER  [facility]     Lorine Bears, MD ADMITTING    Lorretta Harp [phone] ATTENDING    Drema Dallas PERFORMING   [facility], Inpatient  cc:  ------------------------------------------------------------------- LV EF: 55% -    60%  ------------------------------------------------------------------- Indications:      Chest pain 786.51.  ------------------------------------------------------------------- History:   PMH:   Coronary artery disease.  PMH:   Myocardial infarction.  Risk factors:  Current tobacco use. Dyslipidemia.  ------------------------------------------------------------------- Study Conclusions  - Left ventricle: The cavity size was normal. Wall thickness was normal. Systolic function was normal. The estimated ejection fraction was in the range of 55% to 60%. Wall motion was normal; there were no regional wall motion abnormalities. There was an increased relative contribution of atrial contraction to ventricular filling. Doppler parameters are consistent with abnormal left ventricular relaxation (grade 1 diastolic dysfunction).  ------------------------------------------------------------------- Study data:   Study status:  Routine.  Procedure:  Transthoracic echocardiography. Image quality was adequate. Intravenous contrast (Definity) was administered.  Study completion:  There were no complications.          Transthoracic echocardiography.  M-mode, complete 2D, spectral Doppler, and color Doppler.  Birthdate: Patient birthdate: 03-13-1971.  Age:  Patient is 52 yr old.  Sex: Gender: male.    BMI: 28.9 kg/m^2.  Blood pressure:     155/89 Patient status:  Inpatient.  Study date:  Study date: 11/01/2016.  Cardiology Office Note:  .    Date:  06/27/2023  ID:  Norton Pastel, DOB April 12, 1971, MRN [id number] PCP: Babs Sciara, MD  [facility] Providers Cardiologist:  Lance Muss, MD     CC: Transition to new cardiologist  History of Present Illness: .    MICHAEL WALRATH is a 52 y.o. male with smoking, former polysubstance abuse, HLD, and CAD with prior 2018 PCI who presents for transition to new cardiologist.  Discussed the use of AI scribe software for clinical note transcription with the patient, who gave verbal consent to proceed.  History of Present Illness         Mr. Moultrie, a 52 year old male with a history of coronary artery disease, hypertension, hyperlipidemia, and tobacco use, presents with concerns about low blood pressure readings and muscle cramps. He had a myocardial infarction and received three drug-eluting stents to the LAD in 2018. His current medications include Plavix, nitroglycerin, metoprolol, desonide, and chlorothiazide. He recently quit smoking after an ED visit for chest discomfort and low potassium. He has started smoking again since seeing Alden Server. He reports that his blood pressure readings have been low, with readings as low as 96/62 in the evenings. He also reports experiencing muscle cramps, which he suspects may be due to low potassium. He expresses a desire to quit smoking but acknowledges the difficulty of breaking the habit..  No DOE.  No chest pain.  He notes that his anginal equivalent was burning chest discomfort.  Relevant histories: .  Social- smoker, marijuana use, former cocaine ROS: As per HPI.   Studies Reviewed: .   Cardiac Studies & Procedures   CARDIAC CATHETERIZATION  CARDIAC CATHETERIZATION 09/30/2018  Narrative Images from the original result were not included.   Mid Cx lesion is 60% stenosed. FFR 0.84  Previously placed Prox LAD to Mid LAD DES stents (overlapping resolute Onyx DES stents) are widely patent.   Previously placed Dist LAD DES stent (resolute Onyx) is widely patent.  JAILED DIAGONAL BRANCES: Ost 1st Diag lesion is 50% stenosed. Ost 2nd Diag lesion is 55% stenosed. Both improved from prior cath post PCI  Post Atrio lesion is 45% stenosed.  LV end diastolic pressure is normal. The left ventricular ejection fraction is 50-55% by visual estimate.  SUMMARY  Widely patent stents in LAD with mild disease elsewhere.  Improved flow through jailed diagonal branches.  Moderate disease in mCx (~60% - FFR 0.84)  Moderate disease in small caliber 2nd Diag prior to Bifurcation - not PCI target  Normal LVEDP  RECOMMENDATIONS  Return to nursing unit for ongoing care and TR band removal (per PCI protocol because of extra heparin).  Anticipate discharge later today.  Continue aggressive medical management.    Bryan Lemma, M.D., M.S. Interventional Cardiologist  Pager # [phone] Phone # [phone] [address] [place], [place] [address]  Findings Coronary Findings Diagnostic  Dominance: Right  Left Main Vessel is normal in caliber.  Left Anterior Descending Previously placed Prox LAD to Mid LAD stent (unknown type) is widely patent. Overlapping Resolute Onyx DES 2.5 mm x 30 mm &amp; 2.25 mm x 15 mm - post-dilated 3.0 - 2.75 mm Previously placed Mid LAD to Dist LAD stent (unknown type) is widely patent. Resolute Onyx DES 2.25 mm x 12 mm (2.4 mm)  First Diagonal Branch Vessel is small in size. Ost 1st Diag lesion is 50% stenosed. The lesion is concentric and irregular.  First Septal Branch Vessel is small in size.  Marginal Branch Vessel is small in size. Vessel is angiographically normal.  Right Coronary Artery There is mild the vessel.  Right Posterior Descending Artery  Right Posterior Atrioventricular Artery There is mild disease in the vessel.  First Right Posterolateral Branch There is mild disease in the vessel.  Second Right Posterolateral Branch  Intervention  Mid LAD lesion Angioplasty (Also treats lesions: Mid LAD to Dist LAD) A STENT RESOLUTE ONYX 2.5X30 drug eluting stent was successfully  placed. Stent strut is well apposed. Post-stent angioplasty was performed. The pre-interventional distal flow is normal (TIMI 3).  The post-interventional distal flow is normal (TIMI 3). The intervention was successful . No complications occurred at this lesion. There is a 0% residual stenosis post intervention.  Mid LAD to Dist LAD lesion Angioplasty A STENT RESOLUTE ONYX 2.25X15 drug eluting stent was successfully placed, and overlaps previously placed stent. Post-stent angioplasty was performed. The pre-interventional distal flow is normal (TIMI 3).  The post-interventional distal flow is normal (TIMI 3). The intervention was successful . No complications occurred at this lesion. Angioplasty (Also treats lesions: Mid LAD) See details in Mid LAD lesion. There is a 0% residual stenosis post intervention.  Dist LAD lesion Angioplasty Pre-stent angioplasty was not performed. A STENT RESOLUTE ONYX 2.25X12 drug eluting stent was successfully placed. The pre-interventional distal flow is normal (TIMI 3).  The post-interventional distal flow is normal (TIMI 3). The intervention was successful . No complications occurred at this lesion. There is a 0% residual stenosis post intervention.     ECHOCARDIOGRAM  ECHOCARDIOGRAM COMPLETE 11/01/2016  Narrative *[place]* *[facility]* [address] [place], [place] [address] [phone]  ------------------------------------------------------------------- Transthoracic Echocardiography  Patient:    Bruce King, Bruce King MR #:       [id number] Study Date: 11/01/2016 Gender:     M Age:        52 Height:     185.4 cm Weight:     99.2 kg BSA:        2.28 m^2 Pt. Status: Room:       6C05C  SONOGRAPHER  [facility]     Lorine Bears, MD ADMITTING    Lorretta Harp [phone] ATTENDING    Drema Dallas PERFORMING   [facility], Inpatient  cc:  ------------------------------------------------------------------- LV EF: 55% -    60%  ------------------------------------------------------------------- Indications:      Chest pain 786.51.  ------------------------------------------------------------------- History:   PMH:   Coronary artery disease.  PMH:   Myocardial infarction.  Risk factors:  Current tobacco use. Dyslipidemia.  ------------------------------------------------------------------- Study Conclusions  - Left ventricle: The cavity size was normal. Wall thickness was normal. Systolic function was normal. The estimated ejection fraction was in the range of 55% to 60%. Wall motion was normal; there were no regional wall motion abnormalities. There was an increased relative contribution of atrial contraction to ventricular filling. Doppler parameters are consistent with abnormal left ventricular relaxation (grade 1 diastolic dysfunction).  ------------------------------------------------------------------- Study data:   Study status:  Routine.  Procedure:  Transthoracic echocardiography. Image quality was adequate. Intravenous contrast (Definity) was administered.  Study completion:  There were no complications.          Transthoracic echocardiography.  M-mode, complete 2D, spectral Doppler, and color Doppler.  Birthdate: Patient birthdate: 03-13-1971.  Age:  Patient is 52 yr old.  Sex: Gender: male.    BMI: 28.9 kg/m^2.  Blood pressure:     155/89 Patient status:  Inpatient.  Study date:  Study date: 11/01/2016.  Cardiology Office Note:  .    Date:  06/27/2023  ID:  Norton Pastel, DOB April 12, 1971, MRN [id number] PCP: Babs Sciara, MD  [facility] Providers Cardiologist:  Lance Muss, MD     CC: Transition to new cardiologist  History of Present Illness: .    MICHAEL WALRATH is a 52 y.o. male with smoking, former polysubstance abuse, HLD, and CAD with prior 2018 PCI who presents for transition to new cardiologist.  Discussed the use of AI scribe software for clinical note transcription with the patient, who gave verbal consent to proceed.  History of Present Illness         Mr. Moultrie, a 52 year old male with a history of coronary artery disease, hypertension, hyperlipidemia, and tobacco use, presents with concerns about low blood pressure readings and muscle cramps. He had a myocardial infarction and received three drug-eluting stents to the LAD in 2018. His current medications include Plavix, nitroglycerin, metoprolol, desonide, and chlorothiazide. He recently quit smoking after an ED visit for chest discomfort and low potassium. He has started smoking again since seeing Alden Server. He reports that his blood pressure readings have been low, with readings as low as 96/62 in the evenings. He also reports experiencing muscle cramps, which he suspects may be due to low potassium. He expresses a desire to quit smoking but acknowledges the difficulty of breaking the habit..  No DOE.  No chest pain.  He notes that his anginal equivalent was burning chest discomfort.  Relevant histories: .  Social- smoker, marijuana use, former cocaine ROS: As per HPI.   Studies Reviewed: .   Cardiac Studies & Procedures   CARDIAC CATHETERIZATION  CARDIAC CATHETERIZATION 09/30/2018  Narrative Images from the original result were not included.   Mid Cx lesion is 60% stenosed. FFR 0.84  Previously placed Prox LAD to Mid LAD DES stents (overlapping resolute Onyx DES stents) are widely patent.   Previously placed Dist LAD DES stent (resolute Onyx) is widely patent.  JAILED DIAGONAL BRANCES: Ost 1st Diag lesion is 50% stenosed. Ost 2nd Diag lesion is 55% stenosed. Both improved from prior cath post PCI  Post Atrio lesion is 45% stenosed.  LV end diastolic pressure is normal. The left ventricular ejection fraction is 50-55% by visual estimate.  SUMMARY  Widely patent stents in LAD with mild disease elsewhere.  Improved flow through jailed diagonal branches.  Moderate disease in mCx (~60% - FFR 0.84)  Moderate disease in small caliber 2nd Diag prior to Bifurcation - not PCI target  Normal LVEDP  RECOMMENDATIONS  Return to nursing unit for ongoing care and TR band removal (per PCI protocol because of extra heparin).  Anticipate discharge later today.  Continue aggressive medical management.    Bryan Lemma, M.D., M.S. Interventional Cardiologist  Pager # [phone] Phone # [phone] [address] [place], [place] [address]  Findings Coronary Findings Diagnostic  Dominance: Right  Left Main Vessel is normal in caliber.  Left Anterior Descending Previously placed Prox LAD to Mid LAD stent (unknown type) is widely patent. Overlapping Resolute Onyx DES 2.5 mm x 30 mm &amp; 2.25 mm x 15 mm - post-dilated 3.0 - 2.75 mm Previously placed Mid LAD to Dist LAD stent (unknown type) is widely patent. Resolute Onyx DES 2.25 mm x 12 mm (2.4 mm)  First Diagonal Branch Vessel is small in size. Ost 1st Diag lesion is 50% stenosed. The lesion is concentric and irregular.  First Septal Branch Vessel is small in size.  Cardiology Office Note:  .    Date:  06/27/2023  ID:  Norton Pastel, DOB April 12, 1971, MRN [id number] PCP: Babs Sciara, MD  [facility] Providers Cardiologist:  Lance Muss, MD     CC: Transition to new cardiologist  History of Present Illness: .    MICHAEL WALRATH is a 52 y.o. male with smoking, former polysubstance abuse, HLD, and CAD with prior 2018 PCI who presents for transition to new cardiologist.  Discussed the use of AI scribe software for clinical note transcription with the patient, who gave verbal consent to proceed.  History of Present Illness         Mr. Moultrie, a 52 year old male with a history of coronary artery disease, hypertension, hyperlipidemia, and tobacco use, presents with concerns about low blood pressure readings and muscle cramps. He had a myocardial infarction and received three drug-eluting stents to the LAD in 2018. His current medications include Plavix, nitroglycerin, metoprolol, desonide, and chlorothiazide. He recently quit smoking after an ED visit for chest discomfort and low potassium. He has started smoking again since seeing Alden Server. He reports that his blood pressure readings have been low, with readings as low as 96/62 in the evenings. He also reports experiencing muscle cramps, which he suspects may be due to low potassium. He expresses a desire to quit smoking but acknowledges the difficulty of breaking the habit..  No DOE.  No chest pain.  He notes that his anginal equivalent was burning chest discomfort.  Relevant histories: .  Social- smoker, marijuana use, former cocaine ROS: As per HPI.   Studies Reviewed: .   Cardiac Studies & Procedures   CARDIAC CATHETERIZATION  CARDIAC CATHETERIZATION 09/30/2018  Narrative Images from the original result were not included.   Mid Cx lesion is 60% stenosed. FFR 0.84  Previously placed Prox LAD to Mid LAD DES stents (overlapping resolute Onyx DES stents) are widely patent.   Previously placed Dist LAD DES stent (resolute Onyx) is widely patent.  JAILED DIAGONAL BRANCES: Ost 1st Diag lesion is 50% stenosed. Ost 2nd Diag lesion is 55% stenosed. Both improved from prior cath post PCI  Post Atrio lesion is 45% stenosed.  LV end diastolic pressure is normal. The left ventricular ejection fraction is 50-55% by visual estimate.  SUMMARY  Widely patent stents in LAD with mild disease elsewhere.  Improved flow through jailed diagonal branches.  Moderate disease in mCx (~60% - FFR 0.84)  Moderate disease in small caliber 2nd Diag prior to Bifurcation - not PCI target  Normal LVEDP  RECOMMENDATIONS  Return to nursing unit for ongoing care and TR band removal (per PCI protocol because of extra heparin).  Anticipate discharge later today.  Continue aggressive medical management.    Bryan Lemma, M.D., M.S. Interventional Cardiologist  Pager # [phone] Phone # [phone] [address] [place], [place] [address]  Findings Coronary Findings Diagnostic  Dominance: Right  Left Main Vessel is normal in caliber.  Left Anterior Descending Previously placed Prox LAD to Mid LAD stent (unknown type) is widely patent. Overlapping Resolute Onyx DES 2.5 mm x 30 mm &amp; 2.25 mm x 15 mm - post-dilated 3.0 - 2.75 mm Previously placed Mid LAD to Dist LAD stent (unknown type) is widely patent. Resolute Onyx DES 2.25 mm x 12 mm (2.4 mm)  First Diagonal Branch Vessel is small in size. Ost 1st Diag lesion is 50% stenosed. The lesion is concentric and irregular.  First Septal Branch Vessel is small in size.

## 2023-06-27 NOTE — Telephone Encounter (Signed)
CSW attempted to call pt to assess for insurance/medical bill assistance.  Unable to reach- left VM requesting return call  Burna Sis, LCSW Clinical Social Worker [facility] Desk#: [phone] Cell#: [phone]

## 2023-06-27 NOTE — Patient Instructions (Signed)
Medication Instructions:  Your physician has recommended you make the following change in your medication:  DECREASE: metoprolol tartrate (Lopressor) to 25 mg by mouth twice daily  *If you need a refill on your cardiac medications before your next appointment, please call your pharmacy*   Lab Work: BMP, Mg If you have labs (blood work) drawn today and your tests are completely normal, you will receive your results only by: MyChart Message (if you have MyChart) OR A paper copy in the mail If you have any lab test that is abnormal or we need to change your treatment, we will call you to review the results.   Testing/Procedures: Your physician has referred you to see a Health Coach for smoking cessation.   Follow-Up: At [facility], you and your health needs are our priority.  As part of our continuing mission to provide you with exceptional heart care, we have created designated Provider Care Teams.  These Care Teams include your primary Cardiologist (physician) and Advanced Practice Providers (APPs -  Physician Assistants and Nurse Practitioners) who all work together to provide you with the care you need, when you need it.    Your next appointment:   6-7 month(s)  Provider:   Robin Searing, NP

## 2023-06-28 ENCOUNTER — Telehealth: Payer: Self-pay

## 2023-06-28 DIAGNOSIS — Z0000 Encounter for general adult medical examination without abnormal findings: Secondary | ICD-10-CM

## 2023-06-28 LAB — BASIC METABOLIC PANEL
BUN/Creatinine Ratio: 8 — ABNORMAL LOW (ref 9–20)
BUN: 9 mg/dL (ref 6–24)
CO2: 28 mmol/L (ref 20–29)
Calcium: 10.5 mg/dL — ABNORMAL HIGH (ref 8.7–10.2)
Chloride: 92 mmol/L — ABNORMAL LOW (ref 96–106)
Creatinine, Ser: 1.16 mg/dL (ref 0.76–1.27)
Glucose: 224 mg/dL — ABNORMAL HIGH (ref 70–99)
Potassium: 3.6 mmol/L (ref 3.5–5.2)
Sodium: 137 mmol/L (ref 134–144)
eGFR: 76 mL/min/{1.73_m2} (ref >59)

## 2023-06-28 LAB — MAGNESIUM: Magnesium: 2.1 mg/dL (ref 1.6–2.3)

## 2023-06-28 NOTE — Telephone Encounter (Signed)
Called patient per health coaching referral for smoking cessation. Patient did not answer. Left message for patient to return call.    Renaee Munda, MS, ERHD, [facility]  Care Guide, Health & Wellness Coach [address], [address] [place] [address] Telephone: [phone] Email: [email or web address]

## 2023-07-01 ENCOUNTER — Other Ambulatory Visit: Payer: Self-pay

## 2023-07-01 DIAGNOSIS — R7301 Impaired fasting glucose: Secondary | ICD-10-CM

## 2023-07-01 DIAGNOSIS — R739 Hyperglycemia, unspecified: Secondary | ICD-10-CM

## 2023-07-01 DIAGNOSIS — E785 Hyperlipidemia, unspecified: Secondary | ICD-10-CM

## 2023-07-01 DIAGNOSIS — E782 Mixed hyperlipidemia: Secondary | ICD-10-CM

## 2023-07-01 DIAGNOSIS — Z79899 Other long term (current) drug therapy: Secondary | ICD-10-CM

## 2023-07-01 DIAGNOSIS — E876 Hypokalemia: Secondary | ICD-10-CM

## 2023-07-01 DIAGNOSIS — I10 Essential (primary) hypertension: Secondary | ICD-10-CM

## 2023-07-01 DIAGNOSIS — R748 Abnormal levels of other serum enzymes: Secondary | ICD-10-CM

## 2023-07-05 ENCOUNTER — Other Ambulatory Visit: Payer: Self-pay | Admitting: Interventional Cardiology

## 2023-07-09 ENCOUNTER — Telehealth: Payer: Self-pay

## 2023-07-09 DIAGNOSIS — Z0000 Encounter for general adult medical examination without abnormal findings: Secondary | ICD-10-CM

## 2023-07-09 NOTE — Telephone Encounter (Signed)
Called patient per health coaching referral from Dr. Izora Ribas for smoking cessation. Patient did not answer. Left message for patient to return call.   Renaee Munda, MS, ERHD, [facility]  Care Guide, Health & Wellness Coach [address] [place] [address] Telephone: [phone] Email: [email or web address]

## 2023-07-10 ENCOUNTER — Ambulatory Visit: Payer: Self-pay | Admitting: Interventional Cardiology

## 2023-07-23 ENCOUNTER — Emergency Department (HOSPITAL_COMMUNITY)
Admission: EM | Admit: 2023-07-23 | Discharge: 2023-07-23 | Disposition: A | Discharge status: Home / Self Care | Payer: Self-pay | Attending: Emergency Medicine | Admitting: Emergency Medicine

## 2023-07-23 ENCOUNTER — Encounter (HOSPITAL_COMMUNITY): Payer: Self-pay

## 2023-07-23 ENCOUNTER — Other Ambulatory Visit: Payer: Self-pay

## 2023-07-23 ENCOUNTER — Telehealth: Payer: Self-pay

## 2023-07-23 ENCOUNTER — Emergency Department (HOSPITAL_COMMUNITY): Payer: Self-pay

## 2023-07-23 DIAGNOSIS — Z79899 Other long term (current) drug therapy: Secondary | ICD-10-CM | POA: Diagnosis not present

## 2023-07-23 DIAGNOSIS — M51369 Other intervertebral disc degeneration, lumbar region without mention of lumbar back pain or lower extremity pain: Secondary | ICD-10-CM

## 2023-07-23 DIAGNOSIS — Z0000 Encounter for general adult medical examination without abnormal findings: Secondary | ICD-10-CM

## 2023-07-23 DIAGNOSIS — M5126 Other intervertebral disc displacement, lumbar region: Secondary | ICD-10-CM | POA: Diagnosis not present

## 2023-07-23 DIAGNOSIS — M545 Low back pain, unspecified: Secondary | ICD-10-CM | POA: Diagnosis present

## 2023-07-23 MED ORDER — KETOROLAC TROMETHAMINE 15 MG/ML IJ SOLN
15.0000 mg | Freq: Once | INTRAMUSCULAR | Status: AC
  Administered 2023-07-23: 15 mg via INTRAMUSCULAR
  Filled 2023-07-23: qty 1

## 2023-07-23 MED ORDER — DEXAMETHASONE SODIUM PHOSPHATE 10 MG/ML IJ SOLN
10.0000 mg | Freq: Once | INTRAMUSCULAR | Status: AC
  Administered 2023-07-23: 10 mg via INTRAMUSCULAR
  Filled 2023-07-23: qty 1

## 2023-07-23 MED ORDER — PREDNISONE 20 MG PO TABS: 40.0000 mg | ORAL_TABLET | Freq: Every day | ORAL | 0 refills | Status: DC

## 2023-07-23 NOTE — ED Triage Notes (Signed)
Pt ambulatory to triage. Pt states he works for the [employer] and a week ago he was reaching for a package and felt a "pop". Pt states that since then he is having increasing pain. Pt has numbness/tingling in right leg. Pt denies bowel/bladder incontinence.

## 2023-07-23 NOTE — ED Provider Notes (Signed)
[place] EMERGENCY DEPARTMENT AT [facility] Provider Note   CSN: [id number] Arrival date & time: 07/23/23  8295     History  Chief Complaint  Patient presents with   Back Pain    Bruce King is a 52 y.o. male.  HPI Patient presents with back pain and new numbness in his perineum.  Patient works in the post office.  About 1 week ago he was lifting something, felt sudden onset of pain, heard an audible pop.  Since that time he said pain in the right lower back, and over the interval the pain is worsened, with no radiation to the leg, buttocks, and numbness in his perineum.  No true incontinence.  Patient has taken 2 Aleve, per his report, but due to worsening pain presents for evaluation.    Home Medications Prior to Admission medications   Medication Sig Start Date End Date Taking? Authorizing Provider  predniSONE (DELTASONE) 20 MG tablet Take 2 tablets (40 mg total) by mouth daily with breakfast. For the next four days 07/23/23  Yes Gerhard Munch, MD  acetaminophen (TYLENOL) 500 MG tablet Take 1,000 mg by mouth every 6 (six) hours as needed.    [provider]  ALPRAZolam Prudy Feeler) 0.5 MG tablet TAKE ONE TABLET TWICE DAILY AS NEEDED 06/08/23   Babs Sciara, MD  clopidogrel (PLAVIX) 75 MG tablet TAKE ONE TABLET DAILY 07/05/23   Corky Crafts, MD  metoprolol tartrate (LOPRESSOR) 25 MG tablet Take 1 tablet (25 mg total) by mouth 2 (two) times daily. 06/27/23   Chandrasekhar, Rondel Jumbo, MD  nitroGLYCERIN (NITROSTAT) 0.4 MG SL tablet 1 every 5 minutes SL as needed chest discomfort maximum 3 call 911 if ongoing pain 10/30/19   Babs Sciara, MD  rosuvastatin (CRESTOR) 20 MG tablet TAKE ONE TABLET DAILY 07/05/23   Corky Crafts, MD  sertraline (ZOLOFT) 50 MG tablet Take 1 tablet (50 mg total) by mouth daily. 06/08/23   Babs Sciara, MD  sildenafil (VIAGRA) 100 MG tablet Take 0.5-1 tablets (50-100 mg total) by mouth daily as needed for erectile  dysfunction. 07/07/21   Babs Sciara, MD  valsartan-hydrochlorothiazide (DIOVAN-HCT) 160-12.5 MG tablet Take 1 tablet by mouth daily. 06/08/23   Babs Sciara, MD      Allergies    Bactrim [sulfamethoxazole-trimethoprim], Lipitor [atorvastatin], and Pravastatin    Review of Systems   Review of Systems  Physical Exam Updated Vital Signs BP 105/88   Pulse 64   Temp (!) 97.5 F (36.4 C) (Oral)   Resp 14   Ht 6\' 1"  (1.854 m)   Wt 102.1 kg   SpO2 100%   BMI 29.69 kg/m  Physical Exam Vitals and nursing note reviewed.  Constitutional:      General: He is not in acute distress.    Appearance: He is well-developed.  HENT:     Head: Normocephalic and atraumatic.  Eyes:     Conjunctiva/sclera: Conjunctivae normal.  Cardiovascular:     Rate and Rhythm: Normal rate and regular rhythm.  Pulmonary:     Effort: Pulmonary effort is normal. No respiratory distress.     Breath sounds: No stridor.  Abdominal:     General: There is no distension.    Musculoskeletal:     Comments: No obvious deformity, strength is symmetric in both lower extremities and the patient flexes and extends his hip to command.  Patient describes numbness in the medial thigh.  Skin:    General: Skin  is warm and dry.  Neurological:     Mental Status: He is alert and oriented to person, place, and time.     ED Results / Procedures / Treatments   Labs (all labs ordered are listed, but only abnormal results are displayed) Labs Reviewed - No data to display  EKG None  Radiology MR LUMBAR SPINE WO CONTRAST  Result Date: 07/23/2023 CLINICAL DATA:  Low back pain, status paresthesia, cauda equina syndrome suspected. EXAM: MRI LUMBAR SPINE WITHOUT CONTRAST TECHNIQUE: Multiplanar, multisequence MR imaging of the lumbar spine was performed. No intravenous contrast was administered. COMPARISON:  CTA abdomen/pelvis 02/25/2023 FINDINGS: Segmentation: Standard; the lowest formed disc space is designated L5-S1.  Alignment:  Normal. Vertebrae: Vertebral heights are preserved. Background marrow signal is normal. There is trace degenerative endplate edema along the superior L4 endplate anteriorly. There is no suspicious marrow signal abnormality or other marrow edema. Conus medullaris and cauda equina: Conus extends to the L1-L2 level. Conus and cauda equina appear normal. Paraspinal and other soft tissues: Unremarkable. Disc levels: There is mild disc desiccation at L5-S1. There is a large right subarticular zone protrusion at this level which impinges the traversing S1 nerve root. The other disc spaces are preserved, without other herniation. There is no significant spinal canal or neural foraminal stenosis at the other levels. IMPRESSION: Large right subarticular zone protrusion at L5-S1 which impinges the traversing S1 nerve root. Electronically Signed   By: Lesia Hausen M.D.   On: 07/23/2023 12:50    Procedures Procedures    Medications Ordered in ED Medications  dexamethasone (DECADRON) injection 10 mg (10 mg Intramuscular Given 07/23/23 1005)  ketorolac (TORADOL) 15 MG/ML injection 15 mg (15 mg Intramuscular Given 07/23/23 1004)    ED Course/ Medical Decision Making/ A&P                                 Medical Decision Making Adult male presents with right low back pain after acute onset.  Given his description of paresthesia, leg pain, suspicion for lumbosacral pathology, cauda equina is on the differential, but absent incontinence not highest possibility.  Patient's absence of prior analgesics likely contributing to his worsening pain.  Patient had Toradol, steroids, MRI ordered.  Patient has had multiple other medical problems including tobacco addiction, CAD, anxiety, but these seem stable currently. Pulse ox 99% room air normal   Amount and/or Complexity of Data Reviewed External Data Reviewed: notes. Radiology: ordered and independent interpretation performed. Decision-making details  documented in ED Course.  Risk Prescription drug management. Decision regarding hospitalization. Diagnosis or treatment significantly limited by social determinants of health.   1:06 PM On repeat exam patient awake, alert, in no distress.  I reviewed his MRI, discussed with the patient.  Findings consistent with concerns from physical exam, disc bulge, L5-S1 with nerve impingement.        Final Clinical Impression(s) / ED Diagnoses Final diagnoses:  Bulge of lumbar disc without myelopathy    Rx / DC Orders ED Discharge Orders          Ordered    predniSONE (DELTASONE) 20 MG tablet  Daily with breakfast        07/23/23 1306              Gerhard Munch, MD 07/23/23 1307

## 2023-07-23 NOTE — Telephone Encounter (Signed)
Called patient per health coaching referral for smoking cessation. Left patient a voicemail to return call.    Renaee Munda, MS, ERHD, [facility]  Care Guide, Health & Wellness Coach [address] [place] [address] Telephone: [phone] Email: [email or web address]

## 2023-07-23 NOTE — Discharge Instructions (Signed)
Your MRI today demonstrated impingement of one of the nerves as it is your spine and provide sensation to your right leg.  Please take all medication as directed and follow-up with our colleagues as well as with your occupational health office.  Return here for concerning changes in your condition.

## 2023-07-26 ENCOUNTER — Encounter: Payer: Self-pay | Admitting: Family Medicine

## 2023-07-29 MED ORDER — GABAPENTIN 100 MG PO CAPS: 100.0000 mg | ORAL_CAPSULE | Freq: Two times a day (BID) | ORAL | 1 refills | Status: DC

## 2023-07-29 NOTE — Telephone Encounter (Signed)
Nurses May send in gabapentin 100 mg 1 twice daily, #60, 1 refill  I would recommend starting off with 100 mg in the evening for the first 3 days if tolerating well then may increase to 1 twice daily if not significant improvement within 7 days of starting let us know and we can increase the dose  Obviously if the medication causes excessive drowsiness or difficulty staying awake do not drive with the medicine.  If any problems notify us of any serious problems with the medicine stop the medicine Follow-up if any ongoing issues thanks-Dr. Lorin Picket

## 2023-09-13 ENCOUNTER — Other Ambulatory Visit: Payer: Self-pay | Admitting: Family Medicine

## 2023-09-30 ENCOUNTER — Other Ambulatory Visit: Payer: Self-pay | Admitting: Interventional Cardiology

## 2023-11-19 ENCOUNTER — Encounter: Payer: Self-pay | Admitting: Family Medicine

## 2023-11-19 ENCOUNTER — Ambulatory Visit (INDEPENDENT_AMBULATORY_CARE_PROVIDER_SITE_OTHER): Payer: Self-pay | Admitting: Family Medicine

## 2023-11-19 VITALS — BP 119/83 | HR 84 | Temp 98.1°F | Ht 73.0 in | Wt 219.0 lb

## 2023-11-19 DIAGNOSIS — E782 Mixed hyperlipidemia: Secondary | ICD-10-CM

## 2023-11-19 DIAGNOSIS — F17200 Nicotine dependence, unspecified, uncomplicated: Secondary | ICD-10-CM

## 2023-11-19 DIAGNOSIS — Z79899 Other long term (current) drug therapy: Secondary | ICD-10-CM

## 2023-11-19 DIAGNOSIS — R0609 Other forms of dyspnea: Secondary | ICD-10-CM

## 2023-11-19 DIAGNOSIS — I10 Essential (primary) hypertension: Secondary | ICD-10-CM

## 2023-11-19 DIAGNOSIS — Z125 Encounter for screening for malignant neoplasm of prostate: Secondary | ICD-10-CM

## 2023-11-19 DIAGNOSIS — F411 Generalized anxiety disorder: Secondary | ICD-10-CM

## 2023-11-19 DIAGNOSIS — R7301 Impaired fasting glucose: Secondary | ICD-10-CM

## 2023-11-19 MED ORDER — VALSARTAN 160 MG PO TABS: 160.0000 mg | ORAL_TABLET | Freq: Every day | ORAL | 3 refills | Status: DC

## 2023-11-19 MED ORDER — ALPRAZOLAM 0.5 MG PO TABS: ORAL_TABLET | ORAL | 5 refills | Status: DC

## 2023-11-19 MED ORDER — SILDENAFIL CITRATE 100 MG PO TABS: 50.0000 mg | ORAL_TABLET | Freq: Every day | ORAL | 4 refills | Status: AC | PRN

## 2023-11-19 NOTE — Progress Notes (Signed)
   Subjective:    Patient ID: Bruce King, male    DOB: June 04, 1971, 53 y.o.   MRN: [id number]  HPI Patient states he tried sertraline but stated did not help he felt that it really did not help his moods any he states he does not really feel like he wants to be on any medications like that he would rather use the alprazolam.  He has been on this for years he states it does not cause drowsiness He tries to eat relatively healthy He admits he still smokes He knows he needs to quit Denies cardiac symptoms No chest pain or shortness of breath    Review of Systems     Objective:   Physical Exam General-in no acute distress Eyes-no discharge Lungs-respiratory rate normal, CTA CV-no murmurs,RRR Extremities skin warm dry no edema Neuro grossly normal Behavior normal, alert        Assessment & Plan:   1. Mixed hyperlipidemia (Primary) Continue statin healthy diet check lab work - Lipid panel  2. Essential hypertension Continue medication check lab work - Basic metabolic panel - Hepatic function panel  3. Fasting hyperglycemia Check A1c concerning for the possibility of developing diabetes healthy diet discussed - Hemoglobin A1c  4. Generalized anxiety disorder Alprazolam refills given patient denies it causing drowsiness may continue this for now  5. 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 High risk medication use Lab work ordered - Hepatic function panel  7. Screening PSA (prostate specific antigen) Screening ordered - PSA  8. Smoker Patient encouraged to quit smoking  Follow-up for wellness by summertime Viagra refills given per request never used with nitroglycerin

## 2024-01-02 ENCOUNTER — Telehealth: Payer: Self-pay | Admitting: Nurse Practitioner

## 2024-01-02 ENCOUNTER — Emergency Department (HOSPITAL_COMMUNITY): Payer: Self-pay

## 2024-01-02 ENCOUNTER — Observation Stay (HOSPITAL_COMMUNITY)
Admission: EM | Admit: 2024-01-02 | Discharge: 2024-01-04 | Disposition: A | Discharge status: Home / Self Care | Payer: Self-pay | Attending: Internal Medicine | Admitting: Internal Medicine

## 2024-01-02 ENCOUNTER — Other Ambulatory Visit: Payer: Self-pay | Admitting: Family Medicine

## 2024-01-02 ENCOUNTER — Encounter (HOSPITAL_COMMUNITY): Payer: Self-pay | Admitting: Emergency Medicine

## 2024-01-02 ENCOUNTER — Other Ambulatory Visit: Payer: Self-pay

## 2024-01-02 ENCOUNTER — Encounter: Payer: Self-pay | Admitting: Family Medicine

## 2024-01-02 DIAGNOSIS — I200 Unstable angina: Secondary | ICD-10-CM | POA: Diagnosis present

## 2024-01-02 DIAGNOSIS — E119 Type 2 diabetes mellitus without complications: Secondary | ICD-10-CM

## 2024-01-02 DIAGNOSIS — R079 Chest pain, unspecified: Principal | ICD-10-CM

## 2024-01-02 DIAGNOSIS — I2510 Atherosclerotic heart disease of native coronary artery without angina pectoris: Secondary | ICD-10-CM | POA: Diagnosis present

## 2024-01-02 DIAGNOSIS — Z955 Presence of coronary angioplasty implant and graft: Secondary | ICD-10-CM | POA: Insufficient documentation

## 2024-01-02 DIAGNOSIS — I25110 Atherosclerotic heart disease of native coronary artery with unstable angina pectoris: Principal | ICD-10-CM | POA: Insufficient documentation

## 2024-01-02 DIAGNOSIS — E785 Hyperlipidemia, unspecified: Secondary | ICD-10-CM | POA: Diagnosis present

## 2024-01-02 DIAGNOSIS — I10 Essential (primary) hypertension: Secondary | ICD-10-CM | POA: Diagnosis present

## 2024-01-02 DIAGNOSIS — Z79899 Other long term (current) drug therapy: Secondary | ICD-10-CM | POA: Insufficient documentation

## 2024-01-02 DIAGNOSIS — F17210 Nicotine dependence, cigarettes, uncomplicated: Secondary | ICD-10-CM | POA: Insufficient documentation

## 2024-01-02 LAB — HEPATIC FUNCTION PANEL
ALT: 71 U/L — ABNORMAL HIGH (ref 0–44)
AST: 61 U/L — ABNORMAL HIGH (ref 15–41)
Albumin: 3.9 g/dL (ref 3.5–5.0)
Alkaline Phosphatase: 75 U/L (ref 38–126)
Bilirubin, Direct: 0.1 mg/dL (ref 0.0–0.2)
Indirect Bilirubin: 0.7 mg/dL (ref 0.3–0.9)
Total Bilirubin: 0.8 mg/dL (ref 0.0–1.2)
Total Protein: 7.8 g/dL (ref 6.5–8.1)

## 2024-01-02 LAB — LIPID PANEL
Cholesterol: 103 mg/dL (ref 0–200)
HDL: 20 mg/dL — ABNORMAL LOW (ref >40)
LDL Cholesterol: 27 mg/dL (ref 0–99)
Total CHOL/HDL Ratio: 5.2 ratio
Triglycerides: 281 mg/dL — ABNORMAL HIGH (ref <150)
VLDL: 56 mg/dL — ABNORMAL HIGH (ref 0–40)

## 2024-01-02 LAB — CBC
HCT: 45.7 % (ref 39.0–52.0)
Hemoglobin: 15.9 g/dL (ref 13.0–17.0)
MCH: 30.8 pg (ref 26.0–34.0)
MCHC: 34.8 g/dL (ref 30.0–36.0)
MCV: 88.4 fL (ref 80.0–100.0)
Platelets: 260 10*3/uL (ref 150–400)
RBC: 5.17 MIL/uL (ref 4.22–5.81)
RDW: 13 % (ref 11.5–15.5)
WBC: 8.6 10*3/uL (ref 4.0–10.5)
nRBC: 0 % (ref 0.0–0.2)

## 2024-01-02 LAB — BASIC METABOLIC PANEL WITH GFR
Anion gap: 11 (ref 5–15)
BUN: 9 mg/dL (ref 6–20)
CO2: 27 mmol/L (ref 22–32)
Calcium: 10 mg/dL (ref 8.9–10.3)
Chloride: 99 mmol/L (ref 98–111)
Creatinine, Ser: 1.07 mg/dL (ref 0.61–1.24)
GFR, Estimated: >60 mL/min (ref >60)
Glucose, Bld: 175 mg/dL — ABNORMAL HIGH (ref 70–99)
Potassium: 3.7 mmol/L (ref 3.5–5.1)
Sodium: 137 mmol/L (ref 135–145)

## 2024-01-02 LAB — TROPONIN I (HIGH SENSITIVITY)
Troponin I (High Sensitivity): 4 ng/L (ref <18)
Troponin I (High Sensitivity): 5 ng/L (ref <18)
Troponin I (High Sensitivity): 5 ng/L (ref <18)

## 2024-01-02 LAB — HEMOGLOBIN A1C
Hgb A1c MFr Bld: 10.3 % — ABNORMAL HIGH (ref 4.8–5.6)
Mean Plasma Glucose: 248.91 mg/dL

## 2024-01-02 MED ORDER — VALSARTAN-HYDROCHLOROTHIAZIDE 160-12.5 MG PO TABS: 1.0000 | ORAL_TABLET | Freq: Every day | ORAL | 1 refills | Status: DC

## 2024-01-02 MED ORDER — METOPROLOL TARTRATE 25 MG PO TABS
25.0000 mg | ORAL_TABLET | Freq: Two times a day (BID) | ORAL | Status: DC
  Administered 2024-01-02 – 2024-01-04 (×4): 25 mg via ORAL
  Filled 2024-01-02 (×4): qty 1

## 2024-01-02 MED ORDER — NITROGLYCERIN 0.4 MG SL SUBL
0.4000 mg | SUBLINGUAL_TABLET | Freq: Once | SUBLINGUAL | Status: AC | PRN
  Administered 2024-01-02: 0.4 mg via SUBLINGUAL
  Filled 2024-01-02: qty 1

## 2024-01-02 MED ORDER — ROSUVASTATIN CALCIUM 20 MG PO TABS: 20.0000 mg | ORAL_TABLET | Freq: Every day | ORAL | Status: DC

## 2024-01-02 MED ORDER — ASPIRIN 325 MG PO TBEC
325.0000 mg | DELAYED_RELEASE_TABLET | Freq: Once | ORAL | Status: AC
  Administered 2024-01-02: 325 mg via ORAL
  Filled 2024-01-02: qty 1

## 2024-01-02 MED ORDER — ALPRAZOLAM 0.5 MG PO TABS
0.5000 mg | ORAL_TABLET | Freq: Two times a day (BID) | ORAL | Status: DC | PRN
  Administered 2024-01-02 – 2024-01-04 (×3): 0.5 mg via ORAL
  Filled 2024-01-02: qty 1
  Filled 2024-01-02: qty 2
  Filled 2024-01-02: qty 1

## 2024-01-02 MED ORDER — CLOPIDOGREL BISULFATE 75 MG PO TABS
75.0000 mg | ORAL_TABLET | Freq: Every day | ORAL | Status: DC
  Administered 2024-01-03 – 2024-01-04 (×2): 75 mg via ORAL
  Filled 2024-01-02 (×2): qty 1

## 2024-01-02 MED ORDER — IRBESARTAN 150 MG PO TABS
150.0000 mg | ORAL_TABLET | Freq: Every day | ORAL | Status: DC
  Administered 2024-01-03 – 2024-01-04 (×2): 150 mg via ORAL
  Filled 2024-01-02 (×2): qty 1

## 2024-01-02 MED ORDER — HYDROCHLOROTHIAZIDE 12.5 MG PO TABS
12.5000 mg | ORAL_TABLET | Freq: Every day | ORAL | Status: DC
  Administered 2024-01-02 – 2024-01-04 (×3): 12.5 mg via ORAL
  Filled 2024-01-02 (×3): qty 1

## 2024-01-02 NOTE — Telephone Encounter (Signed)
 Spoke with pt. Pt has been having chest pains and tightness through shoulders for the past 2 weeks. Patient has not been SOB however stated the pain was very similar to when he had an event in 2018. Dr Gerda Diss DC valsartan-hydrochlorothiazide "some time back" but reinstated it today after a call to his office. Pt was still concerned after speaking with PCP because the pain feels burning like it did in 2018 and has been happening more and more over the course of the past 2 weeks. Pt states his BP is 150/95 and has been laying on the sofa bc he fell he could not do anything. Bps have been 140/90 to 160/104 the past few days.  (Scheduling reports it was 160/104 on the phone with them) Denies missing medications or taking Viagra.  Advised pt to call 911 or have someone take him to the ED. Stated he wanted to come out to [facility]. Advised to have someone drive him in case of dizziness. Pt stated understanding.

## 2024-01-02 NOTE — ED Provider Triage Note (Signed)
 Emergency Medicine Provider Triage Evaluation Note  Bruce King , a 53 y.o. male  was evaluated in triage.  Pt complains of chest pain and HTN. Reports that he has been experiencing primarily exertional CP starting at bilateral shoulders with movement towards central sternum. Follows with cardiology. History of NSTEMI, CAD, and stent placement.  Review of Systems  Positive: As above Negative: As above  Physical Exam  BP (!) 157/109 (BP Location: Left Arm)   Pulse 72   Temp 98 F (36.7 C)   Resp 18   Ht 6\' 1"  (1.854 m)   Wt 99 kg   SpO2 100%   BMI 28.80 kg/m  Gen:   Awake, no distress   Resp:  Normal effort  MSK:   Moves extremities without difficulty  Other:    Medical Decision Making  Medically screening exam initiated at 5:08 PM.  Appropriate orders placed.  Bruce King was informed that the remainder of the evaluation will be completed by another provider, this initial triage assessment does not replace that evaluation, and the importance of remaining in the ED until their evaluation is complete.     Bruce Knudsen, PA-C 01/02/24 1709

## 2024-01-02 NOTE — ED Triage Notes (Signed)
 Pt endorses intermittent CP and HTN for 1-2 weeks. Pt called his cardiology and sent here. Sharp pains to middle of chest and burning to both shoulders. Pt complaint with HTN meds. Hx of stent placement in 2018.

## 2024-01-02 NOTE — Consult Note (Addendum)
 Cardiology Consultation   Patient ID: Bruce King MRN: [id number]; DOB: Jul 27, 1971  Admit date: 01/02/2024 Date of Consult: 01/02/2024  PCP:  Babs Sciara, MD   [facility] Providers Cardiologist:  Lance Muss, MD        Patient Profile:   Bruce King is a 53 y.o. male with a hx of TUD, former PSUD, HLD, HTN, CAD s/p DESx3 to LAD in 2018 who is being seen 01/02/2024 for the evaluation of CP at the request of Dr. Jeraldine Loots.  History of Present Illness:   72M w/ TUD, former PSUD, HLD, CAD s/p DESx3 to LAD in 2018 who is here for CP.  States that this pain has been going on and off for the last 2 week. Think it's may get worse with physical activity but isn't entirely too sure. When he feels this pain at home, he rests but isn't too sure if that's what makes it go away. Has never tried taking his SLNG so isn't sure if it helps. Was previously on valsartan-hydrochlorothiazide before but had some lower Bps in the 90-100/60s so he was transitioned to just valsartan about 1-2 months ago, but 2 weeks ago his BP started rising up again. BP most recently now 140-150/90 for the last 2 weeks.That seems roughly when his pain started.  Describes the pain as bilateral shoulder pain that radiates down to the center of his chest. This was the same pain that he had when he presented in 2018 (presented for bilateral shoulder pain radiating to the chest, troponins initially negative but then subsequently uptrending, found to have LAD disease then s/p DESx3) and in 2020 (went to cath which showed patent LAD stents, jailed D1 and D2 50-55% stenosis, and moderate Lcx disease 60% stenosis with FFR 0.84 so no intervention performed, and normal LVEDP, ultimately no intervention performed, continued on medical management).  Past Medical History:  Diagnosis Date   Anxiety    Chest pain 09/2018   Coronary artery disease    Depression    GAD (generalized anxiety disorder)    GERD  (gastroesophageal reflux disease)    History of hiatal hernia    Hyperlipidemia    NSTEMI (non-ST elevated myocardial infarction) (HCC) 10/30/2016   Past Surgical History:  Procedure Laterality Date   CORONARY ANGIOPLASTY WITH STENT PLACEMENT  10/31/2016   "3 stents"   CORONARY PRESSURE/FFR STUDY N/A 09/30/2018   Procedure: INTRAVASCULAR PRESSURE WIRE/FFR STUDY;  Surgeon: Marykay Lex, MD;  Location: [facility] INVASIVE CV LAB;  Service: Cardiovascular;  Laterality: N/A;   CORONARY STENT INTERVENTION N/A 10/31/2016   Procedure: Coronary Stent Intervention;  Surgeon: Iran Ouch, MD;  Location: [facility] INVASIVE CV LAB;  Service: Cardiovascular;  Laterality: N/A;   FRACTURE SURGERY     LEFT HEART CATH AND CORONARY ANGIOGRAPHY N/A 10/31/2016   Procedure: Left Heart Cath and Coronary Angiography;  Surgeon: Iran Ouch, MD;  Location: [facility] INVASIVE CV LAB;  Service: Cardiovascular;  Laterality: N/A;   LEFT HEART CATH AND CORONARY ANGIOGRAPHY N/A 09/30/2018   Procedure: LEFT HEART CATH AND CORONARY ANGIOGRAPHY;  Surgeon: Marykay Lex, MD;  Location: [facility] INVASIVE CV LAB;  Service: Cardiovascular;  Laterality: N/A;   ORIF TIBIA & FIBULA FRACTURES Right ~ 05/1996   "titanium rod"     Home Medications:  Prior to Admission medications   Medication Sig Start Date End Date Taking? Authorizing Provider  acetaminophen (TYLENOL) 500 MG tablet Take 1,000 mg by mouth every 6 (six) hours as  needed.   Yes [provider]  ALPRAZolam (XANAX) 0.5 MG tablet TAKE ONE TABLET TWICE DAILY AS NEEDED Patient taking differently: TAKE ONE TABLET TWICE DAILY AS NEEDED FOR ANXIETY 11/19/23  Yes Luking, Scott A, MD  clopidogrel (PLAVIX) 75 MG tablet TAKE ONE TABLET DAILY 09/30/23  Yes Chandrasekhar, Mahesh A, MD  metoprolol tartrate (LOPRESSOR) 25 MG tablet Take 1 tablet (25 mg total) by mouth 2 (two) times daily. 06/27/23  Yes Chandrasekhar, Mahesh A, MD  nitroGLYCERIN (NITROSTAT) 0.4 MG SL tablet 1 every 5 minutes SL as  needed chest discomfort maximum 3 call 911 if ongoing pain 10/30/19  Yes Luking, Scott A, MD  rosuvastatin (CRESTOR) 20 MG tablet TAKE ONE TABLET DAILY 09/30/23  Yes Chandrasekhar, Mahesh A, MD  sildenafil (VIAGRA) 100 MG tablet Take 0.5-1 tablets (50-100 mg total) by mouth daily as needed for erectile dysfunction. 11/19/23  Yes Babs Sciara, MD  valsartan (DIOVAN) 160 MG tablet Take 160 mg by mouth daily.   Yes [provider]  valsartan-hydrochlorothiazide (DIOVAN-HCT) 160-12.5 MG tablet Take 1 tablet by mouth daily. Patient not taking: Reported on 01/02/2024 01/02/24   Babs Sciara, MD    Inpatient Medications: Scheduled Meds:  Continuous Infusions:  PRN Meds:   Allergies:    Allergies  Allergen Reactions   Bactrim [Sulfamethoxazole-Trimethoprim] Other (See Comments)    Felt Drunk   Lipitor [Atorvastatin] Other (See Comments)    Stiffness in joints   Pravastatin Other (See Comments)    stiffness    Social History:   Social History   Socioeconomic History   Marital status: Divorced    Spouse name: Not on file   Number of children: Not on file   Years of education: Not on file   Highest education level: Not on file  Occupational History   Not on file  Tobacco Use   Smoking status: Every Day    Current packs/day: 1.00    Average packs/day: 1 pack/day for 34.3 years (34.3 ttl pk-yrs)    Types: Cigarettes    Start date: 09/28/1989   Smokeless tobacco: Former    Types: Chew, Snuff  Vaping Use   Vaping status: Never Used  Substance and Sexual Activity   Alcohol use: Yes    Alcohol/week: 14.0 standard drinks of alcohol    Types: 14 Cans of beer per week    Comment: 10/31/2016 "only drink on Fridays"   Drug use: No   Sexual activity: Yes  Other Topics Concern   Not on file  Social History Narrative   Not on file   Social Drivers of Health   Financial Resource Strain: Not on file  Food Insecurity: Not on file  Transportation Needs: Not on file  Physical  Activity: Not on file  Stress: Not on file  Social Connections: Not on file  Intimate Partner Violence: Not on file    Family History:   Family History  Problem Relation Age of Onset   Hypertension Mother    Heart attack Father     ROS:  Please see the history of present illness.  All other ROS reviewed and negative.     Physical Exam/Data:   Vitals:   01/02/24 1650 01/02/24 1653 01/02/24 2125 01/02/24 2130  BP: (!) 157/109  (!) 180/106 (!) 171/108  Pulse: 72  66 61  Resp: 18  19 13   Temp: 98 F (36.7 C)  97.8 F (36.6 C)   TempSrc:   Oral   SpO2: 100%  100%  100%  Weight:  99 kg    Height:  6\' 1"  (1.854 m)     No intake or output data in the 24 hours ending 01/02/24 2150    01/02/2024    4:53 PM 11/19/2023    2:56 PM 07/23/2023    8:26 AM  Last 3 Weights  Weight (lbs) 218 lb 4.1 oz 219 lb 225 lb  Weight (kg) 99 kg 99.338 kg 102.059 kg     Body mass index is 28.8 kg/m.  General:  Well nourished, well developed, in no acute distress HEENT: normal Neck: no JVD Vascular: No carotid bruits; Distal pulses 2+ bilaterally Cardiac:  normal S1, S2; RRR; no murmur Lungs:  clear to auscultation bilaterally, no wheezing, rhonchi or rales  Abd: soft, nontender, no hepatomegaly  Ext: no edema Skin: warm and dry  Neuro:  alert and awake, grossly oriented Psych:  Normal affect   EKG:  The EKG was personally reviewed and demonstrates: normal sinus rhythm, Q waves in inferior leads and lateral leads (seem stable from his EKG in 01/2021) Telemetry:  Telemetry was personally reviewed and demonstrates:  normal sinus rhythm  Relevant CV Studies: TTE 11/01/16: - Left ventricle: The cavity size was normal. Wall thickness was    normal. Systolic function was normal. The estimated ejection    fraction was in the range of 55% to 60%. Wall motion was normal;    there were no regional wall motion abnormalities. There was an    increased relative contribution of atrial contraction to     ventricular filling. Doppler parameters are consistent with    abnormal left ventricular relaxation (grade 1 diastolic    dysfunction).   Coronary angiography 10/31/16: Mid RCA lesion, 20 %stenosed. Ost RPDA lesion, 30 %stenosed. 2nd RPLB lesion, 40 %stenosed. Ost 2nd Mrg to 2nd Mrg lesion, 30 %stenosed. Ost 2nd Diag to 2nd Diag lesion, 30 %stenosed. Ost 1st Sept lesion, 70 %stenosed. Ost 1st Diag lesion, 80 %stenosed. The left ventricular systolic function is normal. LV end diastolic pressure is mildly elevated. The left ventricular ejection fraction is 50-55% by visual estimate. A STENT RESOLUTE ONYX 2.25X12 drug eluting stent was successfully placed. Dist LAD lesion, 70 %stenosed. Post intervention, there is a 0% residual stenosis. A STENT RESOLUTE ONYX 2.25X15 drug eluting stent was successfully placed, and overlaps previously placed stent. Mid LAD to Dist LAD lesion, 70 %stenosed. Post intervention, there is a 0% residual stenosis. A STENT RESOLUTE ONYX 2.5X30 drug eluting stent was successfully placed. Mid LAD lesion, 95 %stenosed. Post intervention, there is a 0% residual stenosis.   1. Severe one-vessel coronary artery disease with multiple areas of stenosis in the mid and distal LAD. The vessel is moderately calcified overall. 2. Normal LV systolic function and mildly elevated left ventricular end-diastolic pressure. 3. Successful angioplasty and 3 drug-eluting stent placements to the LAD. There is one distal LAD stent and 2 overlapped stents in the midsegment  Coronary angiography 09/30/18: Mid Cx lesion is 60% stenosed. FFR 0.84 Previously placed Prox LAD to Mid LAD DES stents (overlapping resolute Onyx DES stents) are widely patent. Previously placed Dist LAD DES stent (resolute Onyx) is widely patent. JAILED DIAGONAL BRANCES: Ost 1st Diag lesion is 50% stenosed. Ost 2nd Diag lesion is 55% stenosed. Both improved from prior cath post PCI Post Atrio lesion is 45%  stenosed. LV end diastolic pressure is normal. The left ventricular ejection fraction is 50-55% by visual estimate.   SUMMARY Widely patent stents in LAD with mild  disease elsewhere.  Improved flow through jailed diagonal branches. Moderate disease in mCx (~60% - FFR 0.84) Moderate disease in small caliber 2nd Diag prior to Bifurcation - not PCI target Normal LVEDP   RECOMMENDATIONS Return to nursing unit for ongoing care and TR band removal (per PCI protocol because of extra heparin). Anticipate discharge later today. Continue aggressive medical management.  Laboratory Data:  High Sensitivity Troponin:   Recent Labs  Lab 01/02/24 1720 01/02/24 1949  TROPONINIHS 4 5     Chemistry Recent Labs  Lab 01/02/24 1720  NA 137  K 3.7  CL 99  CO2 27  GLUCOSE 175*  BUN 9  CREATININE 1.07  CALCIUM 10.0  GFRNONAA >60  ANIONGAP 11    Recent Labs  Lab 01/02/24 1720  PROT 7.8  ALBUMIN 3.9  AST 61*  ALT 71*  ALKPHOS 75  BILITOT 0.8   Lipids No results for input(s): "CHOL", "TRIG", "HDL", "LABVLDL", "LDLCALC", "CHOLHDL" in the last 168 hours.  Hematology Recent Labs  Lab 01/02/24 1720  WBC 8.6  RBC 5.17  HGB 15.9  HCT 45.7  MCV 88.4  MCH 30.8  MCHC 34.8  RDW 13.0  PLT 260   Thyroid No results for input(s): "TSH", "FREET4" in the last 168 hours.  BNPNo results for input(s): "BNP", "PROBNP" in the last 168 hours.  DDimer No results for input(s): "DDIMER" in the last 168 hours.   Radiology/Studies:  DG Chest 2 View Result Date: 01/02/2024 CLINICAL DATA:  Chest pain, hypertension EXAM: CHEST - 2 VIEW COMPARISON:  02/25/2023 FINDINGS: Frontal and lateral views of the chest demonstrate an unremarkable cardiac silhouette. No acute airspace disease, effusion, or pneumothorax. No acute bony abnormalities. IMPRESSION: 1. No acute intrathoracic process. Electronically Signed   By: Sharlet Salina M.D.   On: 01/02/2024 19:54    Assessment and Plan:   53 y.o. male with a hx  of TUD, former PSUD, HLD, HTN, CAD s/p DESx3 to LAD in 2018 who is being seen 01/02/2024 for the evaluation of CP at the request of Dr. Jeraldine Loots.  #Unstable angina #Known history of CAD s/p DES x3 to LAD 2018 #Residual moderate mLCx disease 60% stenosis w/ FFR 0.84 in 2020 Known history of CAD with DESx3 to the LAD in 2018. He had a similar presentation in 2020 for which he presented with bilateral shoulder pain radiating to his chest, troponin negative, ultimately taken for coronary angiography which showed patent LAD stents, mild jailing of D1 and D2 50-55%, and moderate mLCx disease 60% stenosis with an FFR 0.84, no intervention performed and optimized on medical management. He now presents again for similar bilateral shoulder pain radiating to his chest. He has been troponin negative x2 (similar to previous presentations), but the similar nature of the pain is concerning, especially knowing that he had a borderline mLCx lesion in 2020 that was not intervened on since FFR was >0.80. Anticipate that we'll need to FFR that lesion again to see if it is worse. He is currently having pain while at rest so will treat with antianginals to see if that helps with his pain. He will need better control of his BP as part of his medical management as hypertension could certainly be a precipitant for angina. - TTE in the AM - NPO at MN; anticipate coronary angiography in the morning to assess LAD stents and repeat FFR of his previous moderate stenosis mLCx - S/p ASA 325mg  PO x1 in the ED; need for DAPT based on results  of his coronary angiography - Repeat troponin to assess for trend; if positive, then would need to start IV heparin gtt tonight - Continue home plavix 75mg  daily - Give SLNG now to see if that helps with his pain - Continue home metoprolol, switched to metoprolol succinate 25mg  BID for smoother drug levels - Separate out new home valsartan and hydrochlorothiazide while inpatient to ease titrations;  will give hydrochlorothiazide 12.5mg  now (took his home valsartan 160mg  this morning) - Continue home rosuvastatin 20mg  daily - Add-on A1c and lipid panel to labs, anticipate could uptitrate his home statin pending lipid panel results  Risk Assessment/Risk Scores:                For questions or updates, please contact [facility] Please consult [email or web address] for contact info under    Signed, Bella Kennedy, MD  01/02/2024 9:50 PM

## 2024-01-02 NOTE — Telephone Encounter (Signed)
  Per MyChart scheduling message:   Pt c/o of Chest Pain: STAT if active (IN THIS MOMENT) CP, including tightness, pressure, jaw pain, shoulder/upper arm/back pain, SOB, nausea, and vomiting.  1. Are you having CP right now (tightness, pressure, or discomfort)?   2. Are you experiencing any other symptoms (ex. SOB, nausea, vomiting, sweating)?   3. How long have you been experiencing CP?   4. Is your CP continuous or coming and going?   5. Have you taken Nitroglycerin?   6. If CP returns before callback, please consider calling 911. ?   No other symptoms other than off and on discomfort in my chest. Been happening a few times a day for like a week or so. Not continuous no nausea or shortness of breath. No a haven't taken nitro. Bp has been running around 145/90 for about week and a half   Just checked bp again and it's 160/104

## 2024-01-02 NOTE — ED Provider Notes (Signed)
 [place] [facility]ERGENCY DEPARTMENT AT [facility] Provider Note   CSN: [id number] Arrival date & time: 01/02/24  1636     History  Chief Complaint  Patient presents with   Chest Pain   Hypertension    Bruce King is a 53 y.o. male with PMHx smoking, former polysubstance abuse, HLD, and CAD with prior 2018 PCI presents with chest pain for two weeks.  Patient states that over the last 2 weeks, he has had intermittent, unprovoked pain to his bilateral shoulders that radiates to the his substernal chest, where the pain is more sharp.  He denies any vomiting but does have intermittent shortness of breath with this pain.  He states that when he had his stents placed in 2018, he had similar pain at the time but it was more persistent.  He tried calling his cardiologist today to set up an appointment, but they suggested presenting to the ED instead.   Chest Pain Hypertension Associated symptoms include chest pain.       Home Medications Prior to Admission medications   Medication Sig Start Date End Date Taking? Authorizing Provider  acetaminophen (TYLENOL) 500 MG tablet Take 1,000 mg by mouth every 6 (six) hours as needed.   Yes [provider]  ALPRAZolam (XANAX) 0.5 MG tablet TAKE ONE TABLET TWICE DAILY AS NEEDED Patient taking differently: TAKE ONE TABLET TWICE DAILY AS NEEDED FOR ANXIETY 11/19/23  Yes Luking, Scott A, MD  clopidogrel (PLAVIX) 75 MG tablet TAKE ONE TABLET DAILY 09/30/23  Yes Chandrasekhar, Mahesh A, MD  metoprolol tartrate (LOPRESSOR) 25 MG tablet Take 1 tablet (25 mg total) by mouth 2 (two) times daily. 06/27/23  Yes Chandrasekhar, Mahesh A, MD  nitroGLYCERIN (NITROSTAT) 0.4 MG SL tablet 1 every 5 minutes SL as needed chest discomfort maximum 3 call 911 if ongoing pain 10/30/19  Yes Luking, Scott A, MD  rosuvastatin (CRESTOR) 20 MG tablet TAKE ONE TABLET DAILY 09/30/23  Yes Chandrasekhar, Mahesh A, MD  sildenafil (VIAGRA) 100 MG tablet Take 0.5-1  tablets (50-100 mg total) by mouth daily as needed for erectile dysfunction. 11/19/23  Yes Babs Sciara, MD  valsartan (DIOVAN) 160 MG tablet Take 160 mg by mouth daily.   Yes [provider]  valsartan-hydrochlorothiazide (DIOVAN-HCT) 160-12.5 MG tablet Take 1 tablet by mouth daily. Patient not taking: Reported on 01/02/2024 01/02/24   Babs Sciara, MD      Allergies    Bactrim [sulfamethoxazole-trimethoprim], Lipitor [atorvastatin], and Pravastatin    Review of Systems   Review of Systems  Cardiovascular:  Positive for chest pain.    Physical Exam Updated Vital Signs BP (!) 168/96   Pulse (!) 55   Temp 97.8 F (36.6 C) (Oral)   Resp 10   Ht 6\' 1"  (1.854 m)   Wt 99 kg   SpO2 99%   BMI 28.80 kg/m  Physical Exam Vitals and nursing note reviewed.  Constitutional:      General: He is not in acute distress.    Appearance: He is well-developed.  HENT:     Head: Normocephalic and atraumatic.  Eyes:     Conjunctiva/sclera: Conjunctivae normal.  Cardiovascular:     Rate and Rhythm: Normal rate and regular rhythm.     Pulses:          Radial pulses are 2+ on the right side and 2+ on the left side.     Heart sounds: No murmur heard. Pulmonary:     Effort: Pulmonary effort  is normal. No respiratory distress.     Breath sounds: Normal breath sounds.  Abdominal:     General: Abdomen is flat.     Palpations: Abdomen is soft.     Tenderness: There is no abdominal tenderness. There is no guarding or rebound.  Musculoskeletal:        General: No swelling.     Cervical back: Neck supple.     Right lower leg: No edema.     Left lower leg: No edema.  Skin:    General: Skin is warm and dry.     Capillary Refill: Capillary refill takes less than 2 seconds.  Neurological:     Mental Status: He is alert.  Psychiatric:        Mood and Affect: Mood normal.     ED Results / Procedures / Treatments   Labs (all labs ordered are listed, but only abnormal results are  displayed) Labs Reviewed  BASIC METABOLIC PANEL WITH GFR - Abnormal; Notable for the following components:      Result Value   Glucose, Bld 175 (*)    All other components within normal limits  HEPATIC FUNCTION PANEL - Abnormal; Notable for the following components:   AST 61 (*)    ALT 71 (*)    All other components within normal limits  HEMOGLOBIN A1C - Abnormal; Notable for the following components:   Hgb A1c MFr Bld 10.3 (*)    All other components within normal limits  CBC  LIPID PANEL  TROPONIN I (HIGH SENSITIVITY)  TROPONIN I (HIGH SENSITIVITY)  TROPONIN I (HIGH SENSITIVITY)    EKG EKG Interpretation Date/Time:  Thursday January 02 2024 16:52:03 EDT Ventricular Rate:  79 PR Interval:  162 QRS Duration:  96 QT Interval:  384 QTC Calculation: 440 R Axis:   55  Text Interpretation: Normal sinus rhythm Cannot rule out Inferior infarct , age undetermined Abnormal ECG Confirmed by Gerhard Munch [phone]) on 01/02/2024 9:36:20 PM  Radiology DG Chest 2 View Result Date: 01/02/2024 CLINICAL DATA:  Chest pain, hypertension EXAM: CHEST - 2 VIEW COMPARISON:  02/25/2023 FINDINGS: Frontal and lateral views of the chest demonstrate an unremarkable cardiac silhouette. No acute airspace disease, effusion, or pneumothorax. No acute bony abnormalities. IMPRESSION: 1. No acute intrathoracic process. Electronically Signed   By: Sharlet Salina M.D.   On: 01/02/2024 19:54    Procedures Procedures    Medications Ordered in ED Medications  hydrochlorothiazide (HYDRODIURIL) tablet 12.5 mg (has no administration in time range)  nitroGLYCERIN (NITROSTAT) SL tablet 0.4 mg (0.4 mg Sublingual Given 01/02/24 2318)  aspirin EC tablet 325 mg (325 mg Oral Given 01/02/24 2317)    ED Course/ Medical Decision Making/ A&P Clinical Course as of 01/02/24 2334  Thu Jan 02, 2024  2137 Dr. Elenore King patient, [AO]    Clinical Course User Index [AO] Barrett Shell, MD                                  Medical Decision Making Amount and/or Complexity of Data Reviewed Labs: ordered. Radiology: ordered.  Risk OTC drugs. Decision regarding hospitalization.   Patient is alert, afebrile, and hemodynamically stable in the ED in no acute distress.  Physical exam as noted above, with no acute findings.  Differential includes ACS, pneumonia, pneumothorax, amongst other diagnoses.  Greatest concern at this time is ACS given reported similar history.  Workup resulted through triage, unremarkable CBC, BMP,  and LFTs mildly elevated at 61 and 71.  First and second troponin WNL.  I personally interpreted patient's EKG, which demonstrated sinus rhythm with rate of 79, normal PR interval, QRS duration, and QTc.  No ST elevations or depressions, no T wave inversions in concordant leads, overall no acute ischemic changes.  I personally interpreted patient's chest x-ray, which demonstrated no focal consolidations concerning for pneumonia.  Given that patient is high risk for chest pain, cardiology was consulted and Dr. Juel Burrow saw the patient at bedside in the ED.  They would like admission for further monitoring and management, possible catheterization tomorrow morning.  Handoff was given to admitting team, and patient remained stable.  Patient seen in conjunction with Dr. Jeraldine Loots, who agreed with the above work-up and plan of care.         Final Clinical Impression(s) / ED Diagnoses Final diagnoses:  Chest pain, unspecified type    Rx / DC Orders ED Discharge Orders     None         Barrett Shell, MD 01/02/24 3086    Gerhard Munch, MD 01/02/24 2336

## 2024-01-03 ENCOUNTER — Encounter (HOSPITAL_COMMUNITY): Payer: Self-pay | Admitting: Cardiovascular Disease

## 2024-01-03 ENCOUNTER — Ambulatory Visit (HOSPITAL_COMMUNITY)
Admission: EM | Disposition: A | Discharge status: Home / Self Care | Payer: Self-pay | Source: Home / Self Care | Attending: Emergency Medicine

## 2024-01-03 ENCOUNTER — Observation Stay (HOSPITAL_COMMUNITY): Payer: Self-pay

## 2024-01-03 DIAGNOSIS — E119 Type 2 diabetes mellitus without complications: Secondary | ICD-10-CM

## 2024-01-03 DIAGNOSIS — R079 Chest pain, unspecified: Secondary | ICD-10-CM

## 2024-01-03 DIAGNOSIS — E782 Mixed hyperlipidemia: Secondary | ICD-10-CM

## 2024-01-03 DIAGNOSIS — I10 Essential (primary) hypertension: Secondary | ICD-10-CM

## 2024-01-03 DIAGNOSIS — I200 Unstable angina: Secondary | ICD-10-CM

## 2024-01-03 DIAGNOSIS — I25118 Atherosclerotic heart disease of native coronary artery with other forms of angina pectoris: Secondary | ICD-10-CM

## 2024-01-03 HISTORY — PX: LEFT HEART CATH AND CORONARY ANGIOGRAPHY: CATH118249

## 2024-01-03 LAB — COMPREHENSIVE METABOLIC PANEL WITH GFR
ALT: 59 U/L — ABNORMAL HIGH (ref 0–44)
AST: 65 U/L — ABNORMAL HIGH (ref 15–41)
Albumin: 3.5 g/dL (ref 3.5–5.0)
Alkaline Phosphatase: 63 U/L (ref 38–126)
Anion gap: 9 (ref 5–15)
BUN: 8 mg/dL (ref 6–20)
CO2: 25 mmol/L (ref 22–32)
Calcium: 9 mg/dL (ref 8.9–10.3)
Chloride: 101 mmol/L (ref 98–111)
Creatinine, Ser: 1.11 mg/dL (ref 0.61–1.24)
GFR, Estimated: >60 mL/min (ref >60)
Glucose, Bld: 158 mg/dL — ABNORMAL HIGH (ref 70–99)
Potassium: 3.7 mmol/L (ref 3.5–5.1)
Sodium: 135 mmol/L (ref 135–145)
Total Bilirubin: 1.2 mg/dL (ref 0.0–1.2)
Total Protein: 6.8 g/dL (ref 6.5–8.1)

## 2024-01-03 LAB — CBG MONITORING, ED
Glucose-Capillary: 171 mg/dL — ABNORMAL HIGH (ref 70–99)
Glucose-Capillary: 176 mg/dL — ABNORMAL HIGH (ref 70–99)

## 2024-01-03 LAB — GLUCOSE, CAPILLARY
Glucose-Capillary: 265 mg/dL — ABNORMAL HIGH (ref 70–99)
Glucose-Capillary: 110 mg/dL — ABNORMAL HIGH (ref 70–99)
Glucose-Capillary: 103 mg/dL — ABNORMAL HIGH (ref 70–99)

## 2024-01-03 LAB — HIV ANTIBODY (ROUTINE TESTING W REFLEX): HIV Screen 4th Generation wRfx: NONREACTIVE

## 2024-01-03 SURGERY — LEFT HEART CATH AND CORONARY ANGIOGRAPHY
Anesthesia: LOCAL

## 2024-01-03 MED ORDER — MIDAZOLAM HCL 2 MG/2ML IJ SOLN
INTRAMUSCULAR | Status: DC | PRN
  Administered 2024-01-03: 2 mg via INTRAVENOUS

## 2024-01-03 MED ORDER — SODIUM CHLORIDE 0.9 % IV SOLN: 250.0000 mL | INTRAVENOUS | Status: DC | PRN

## 2024-01-03 MED ORDER — ENOXAPARIN SODIUM 40 MG/0.4ML IJ SOSY
40.0000 mg | PREFILLED_SYRINGE | INTRAMUSCULAR | Status: DC
  Administered 2024-01-03: 40 mg via SUBCUTANEOUS
  Filled 2024-01-03: qty 0.4

## 2024-01-03 MED ORDER — FENTANYL CITRATE (PF) 100 MCG/2ML IJ SOLN
INTRAMUSCULAR | Status: AC
  Filled 2024-01-03: qty 2

## 2024-01-03 MED ORDER — ACETAMINOPHEN 650 MG RE SUPP
650.0000 mg | Freq: Four times a day (QID) | RECTAL | Status: DC | PRN
Start: 2024-01-03 — End: 2024-01-04

## 2024-01-03 MED ORDER — FENOFIBRATE 160 MG PO TABS
160.0000 mg | ORAL_TABLET | Freq: Every day | ORAL | Status: DC
  Administered 2024-01-03 – 2024-01-04 (×2): 160 mg via ORAL
  Filled 2024-01-03 (×2): qty 1

## 2024-01-03 MED ORDER — VERAPAMIL HCL 2.5 MG/ML IV SOLN
INTRAVENOUS | Status: AC
  Filled 2024-01-03: qty 2

## 2024-01-03 MED ORDER — LABETALOL HCL 5 MG/ML IV SOLN: 10.0000 mg | INTRAVENOUS | Status: AC | PRN

## 2024-01-03 MED ORDER — ASPIRIN 81 MG PO CHEW
81.0000 mg | CHEWABLE_TABLET | Freq: Once | ORAL | Status: AC
  Administered 2024-01-03: 81 mg via ORAL
  Filled 2024-01-03: qty 1

## 2024-01-03 MED ORDER — ACETAMINOPHEN 325 MG PO TABS
650.0000 mg | ORAL_TABLET | Freq: Four times a day (QID) | ORAL | Status: DC | PRN
  Administered 2024-01-03: 650 mg via ORAL
  Filled 2024-01-03: qty 2

## 2024-01-03 MED ORDER — IOHEXOL 350 MG/ML SOLN
INTRAVENOUS | Status: DC | PRN
  Administered 2024-01-03: 45 mL

## 2024-01-03 MED ORDER — HYDRALAZINE HCL 20 MG/ML IJ SOLN: 10.0000 mg | INTRAMUSCULAR | Status: AC | PRN

## 2024-01-03 MED ORDER — SODIUM CHLORIDE 0.9 % IV SOLN
INTRAVENOUS | Status: AC
  Administered 2024-01-03: 50 mL via INTRAVENOUS

## 2024-01-03 MED ORDER — ENOXAPARIN SODIUM 40 MG/0.4ML IJ SOSY
40.0000 mg | PREFILLED_SYRINGE | INTRAMUSCULAR | Status: DC
  Administered 2024-01-04: 40 mg via SUBCUTANEOUS
  Filled 2024-01-03: qty 0.4

## 2024-01-03 MED ORDER — INSULIN ASPART 100 UNIT/ML IJ SOLN
0.0000 [IU] | INTRAMUSCULAR | Status: DC
  Administered 2024-01-03: 5 [IU] via SUBCUTANEOUS
  Administered 2024-01-03: 2 [IU] via SUBCUTANEOUS

## 2024-01-03 MED ORDER — HEPARIN SODIUM (PORCINE) 1000 UNIT/ML IJ SOLN
INTRAMUSCULAR | Status: DC | PRN
  Administered 2024-01-03: 5000 [IU] via INTRAVENOUS

## 2024-01-03 MED ORDER — SODIUM CHLORIDE 0.9 % IV SOLN: INTRAVENOUS | Status: DC

## 2024-01-03 MED ORDER — MIDAZOLAM HCL 2 MG/2ML IJ SOLN
INTRAMUSCULAR | Status: AC
Start: 2024-01-03 — End: ?
  Filled 2024-01-03: qty 2

## 2024-01-03 MED ORDER — LIVING WELL WITH DIABETES BOOK
Freq: Once | Status: DC
  Filled 2024-01-03: qty 1

## 2024-01-03 MED ORDER — SODIUM CHLORIDE 0.9% FLUSH
3.0000 mL | Freq: Two times a day (BID) | INTRAVENOUS | Status: DC
  Administered 2024-01-04: 3 mL via INTRAVENOUS

## 2024-01-03 MED ORDER — INSULIN ASPART 100 UNIT/ML IJ SOLN
0.0000 [IU] | Freq: Three times a day (TID) | INTRAMUSCULAR | Status: DC
  Administered 2024-01-04: 3 [IU] via SUBCUTANEOUS
  Administered 2024-01-04: 2 [IU] via SUBCUTANEOUS

## 2024-01-03 MED ORDER — VERAPAMIL HCL 2.5 MG/ML IV SOLN
INTRAVENOUS | Status: DC | PRN
  Administered 2024-01-03: 10 mL via INTRA_ARTERIAL

## 2024-01-03 MED ORDER — HEPARIN SODIUM (PORCINE) 1000 UNIT/ML IJ SOLN
INTRAMUSCULAR | Status: AC
Start: 2024-01-03 — End: ?
  Filled 2024-01-03: qty 10

## 2024-01-03 MED ORDER — HEPARIN (PORCINE) IN NACL 1000-0.9 UT/500ML-% IV SOLN
INTRAVENOUS | Status: DC | PRN
  Administered 2024-01-03: 500 mL

## 2024-01-03 MED ORDER — SODIUM CHLORIDE 0.9% FLUSH: 3.0000 mL | INTRAVENOUS | Status: DC | PRN

## 2024-01-03 MED ORDER — LIDOCAINE HCL (PF) 1 % IJ SOLN
INTRAMUSCULAR | Status: AC
  Filled 2024-01-03: qty 30

## 2024-01-03 MED ORDER — FENTANYL CITRATE (PF) 100 MCG/2ML IJ SOLN
INTRAMUSCULAR | Status: DC | PRN
  Administered 2024-01-03: 50 ug via INTRAVENOUS

## 2024-01-03 SURGICAL SUPPLY — 8 items
CATH 5FR JL3.5 JR4 ANG PIG MP (CATHETERS) IMPLANT
DEVICE RAD COMP TR BAND LRG (VASCULAR PRODUCTS) IMPLANT
GLIDESHEATH SLEND SS 6F .021 (SHEATH) IMPLANT
GUIDEWIRE INQWIRE 1.5J.035X260 (WIRE) IMPLANT
INQWIRE 1.5J .035X260CM (WIRE) ×1 IMPLANT
KIT SYRINGE INJ CVI SPIKEX1 (MISCELLANEOUS) IMPLANT
PACK CARDIAC CATHETERIZATION (CUSTOM PROCEDURE TRAY) ×1 IMPLANT
SET ATX-X65L (MISCELLANEOUS) IMPLANT

## 2024-01-03 NOTE — Inpatient Diabetes Management (Signed)
 Inpatient Diabetes Program Recommendations  AACE/ADA: New Consensus Statement on Inpatient Glycemic Control (2015)  Target Ranges:  Prepandial:   less than 140 mg/dL      Peak postprandial:   less than 180 mg/dL (1-2 hours)      Critically ill patients:  140 - 180 mg/dL   Lab Results  Component Value Date   GLUCAP 171 (H) 01/03/2024   HGBA1C 10.3 (H) 01/02/2024    Diabetes history: New Onset DM2 A1c 10.3 (249) Current orders for Inpatient glycemic control: Novolog 0-9 units q 4 hrs.  Inpatient Diabetes Program Recommendations:    Spoke with pt and his mom about new diagnosis. Discussed A1C results 10.3 (Average blood glucose 249 over the past 2-3 months) with them and explained what an A1C is, basic pathophysiology of DM Type 2, basic home care, basic diabetes diet nutrition principles, importance of checking CBGs and maintaining good CBG control to prevent long-term and short-term complications. Reviewed signs and symptoms of hyperglycemia and hypoglycemia and how to treat hypoglycemia at home. Also reviewed blood sugar goals at home.  RNs to provide ongoing basic DM education at bedside with this patient. Have ordered educational booklet and placed RD consult for DM diet education for this patient.    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 Patient shared that he is willing to decrease sweets, limit carbs, drink sugar free drinks and water to get her CBGs down. Reviewed with patient normal values of CBGs and A1c. Discussed need to keep glucose levels stable to decrease side effects of elevated blood glucose.  Thank you, Bruce King. Dior Stepter, RN, MSN, CDCES  Diabetes Coordinator Inpatient Glycemic Control Team Team Pager [phone] (8am-5pm) 01/03/2024 12:32 PM

## 2024-01-03 NOTE — Interval H&P Note (Signed)
 History and Physical Interval Note:  01/03/2024 1:13 PM  Bruce King  has presented today for surgery, with the diagnosis of [place].  The various methods of treatment have been discussed with the patient and family. After consideration of risks, benefits and other options for treatment, the patient has consented to  Procedure(s): LEFT HEART CATH AND CORONARY ANGIOGRAPHY (N/A) as a surgical intervention.  The patient's history has been reviewed, patient examined, no change in status, stable for surgery.  I have reviewed the patient's chart and labs.  Questions were answered to the patient's satisfaction.    Cath Lab Visit (complete for each Cath Lab visit)  Clinical Evaluation Leading to the Procedure:   ACS: No.  Non-ACS:    Anginal Classification: CCS III  Anti-ischemic medical therapy: Minimal Therapy (1 class of medications)  Non-Invasive Test Results: No non-invasive testing performed  Prior CABG: No previous CABG   Verne Carrow

## 2024-01-03 NOTE — Progress Notes (Signed)
 I have seen and assessed patient and agree with Dr.Rathore's assessment and plan. Patient is a pleasant 53 year old gentleman history of CAD status post PCI in 2018, hypertension, hyperlipidemia, type 2 diabetes, depression and anxiety presented to the ED with chest pain.  Patient seen in consultation by cardiology and patient for cardiac catheterization today for further evaluation and management.  Patient also newly diagnosed diabetic and will consult with diabetes coordinator for diabetes education and further recommendations.  No charge.

## 2024-01-03 NOTE — Plan of Care (Signed)

## 2024-01-03 NOTE — H&P (Signed)
 History and Physical    Bruce King ZOX:[id number] DOB: [birth year] DOA: 01/02/2024  PCP: Babs Sciara, MD  Patient coming from: Home  Chief Complaint: Chest pain  HPI: Bruce King is a 53 y.o. male with medical history significant of CAD status post PCI in 2018, hypertension, hyperlipidemia, type 2 diabetes, anxiety/depression, GERD, hiatal hernia, tobacco abuse presented to the ED with a chief complaint of chest pain.  Hypertensive on arrival with blood pressure 157/109.  Not hypoxic.  EKG showing sinus rhythm and no acute ischemic changes.  Labs notable for glucose 175, troponin negative x 3, transaminases mildly elevated but alk phos and T. bili normal. Chest x-ray negative for acute finding. Cardiology consulted and patient was given aspirin 325 mg, hydrochlorothiazide 12.5 mg, metoprolol 25 mg, Xanax 0.5 mg, and sublingual nitroglycerin in the ED.  [facility] called to admit.  Patient states he has been having nonexertional chest pain on and off for the past few weeks.  He describes it as substernal sharp chest pain associated with burning pain in both of his shoulders.  No associated shortness of breath, nausea, or diaphoresis.  Chest pain episodes became more frequent over the past few weeks so he spoke to his cardiologist and was sent to the ED for further evaluation as he had similar episodes of chest pain when he had his cardiac stents placed back in 2018.  Not having active chest pain at this time.  Also reporting elevated blood pressure with systolic in the 140s to 150s at home recently.  Patient states he was previously on valsartan-hydrochlorothiazide combination pill which was stopped by his physician 2 months ago as his blood pressure was running low at that time with systolic 90-100.  He is currently taking valsartan and metoprolol only for high blood pressure.  No other complaints.  Review of Systems:  Review of Systems  All other systems reviewed and are  negative.   Past Medical History:  Diagnosis Date   Anxiety    Chest pain 09/2018   Coronary artery disease    Depression    GAD (generalized anxiety disorder)    GERD (gastroesophageal reflux disease)    History of hiatal hernia    Hyperlipidemia    NSTEMI (non-ST elevated myocardial infarction) (HCC) 10/30/2016    Past Surgical History:  Procedure Laterality Date   CORONARY ANGIOPLASTY WITH STENT PLACEMENT  10/31/2016   "3 stents"   CORONARY PRESSURE/FFR STUDY N/A 09/30/2018   Procedure: INTRAVASCULAR PRESSURE WIRE/FFR STUDY;  Surgeon: Marykay Lex, MD;  Location: [facility] INVASIVE CV LAB;  Service: Cardiovascular;  Laterality: N/A;   CORONARY STENT INTERVENTION N/A 10/31/2016   Procedure: Coronary Stent Intervention;  Surgeon: Iran Ouch, MD;  Location: [facility] INVASIVE CV LAB;  Service: Cardiovascular;  Laterality: N/A;   FRACTURE SURGERY     LEFT HEART CATH AND CORONARY ANGIOGRAPHY N/A 10/31/2016   Procedure: Left Heart Cath and Coronary Angiography;  Surgeon: Iran Ouch, MD;  Location: [facility] INVASIVE CV LAB;  Service: Cardiovascular;  Laterality: N/A;   LEFT HEART CATH AND CORONARY ANGIOGRAPHY N/A 09/30/2018   Procedure: LEFT HEART CATH AND CORONARY ANGIOGRAPHY;  Surgeon: Marykay Lex, MD;  Location: [facility] INVASIVE CV LAB;  Service: Cardiovascular;  Laterality: N/A;   ORIF TIBIA & FIBULA FRACTURES Right ~ 05/1996   "titanium rod"     reports that he has been smoking cigarettes. He started smoking about 34 years ago. He has a 34.3 pack-year smoking history. He has quit using  smokeless tobacco.  His smokeless tobacco use included chew and snuff. He reports current alcohol use of about 14.0 standard drinks of alcohol per week. He reports that he does not use drugs.  Allergies  Allergen Reactions   Bactrim [Sulfamethoxazole-Trimethoprim] Other (See Comments)    Felt Drunk   Lipitor [Atorvastatin] Other (See Comments)    Stiffness in joints   Pravastatin Other (See Comments)     stiffness    Family History  Problem Relation Age of Onset   Hypertension Mother    Heart attack Father     Prior to Admission medications   Medication Sig Start Date End Date Taking? Authorizing Provider  acetaminophen (TYLENOL) 500 MG tablet Take 1,000 mg by mouth every 6 (six) hours as needed.   Yes [provider]  ALPRAZolam (XANAX) 0.5 MG tablet TAKE ONE TABLET TWICE DAILY AS NEEDED Patient taking differently: TAKE ONE TABLET TWICE DAILY AS NEEDED FOR ANXIETY 11/19/23  Yes Luking, Scott A, MD  clopidogrel (PLAVIX) 75 MG tablet TAKE ONE TABLET DAILY 09/30/23  Yes Chandrasekhar, Mahesh A, MD  metoprolol tartrate (LOPRESSOR) 25 MG tablet Take 1 tablet (25 mg total) by mouth 2 (two) times daily. 06/27/23  Yes Chandrasekhar, Mahesh A, MD  nitroGLYCERIN (NITROSTAT) 0.4 MG SL tablet 1 every 5 minutes SL as needed chest discomfort maximum 3 call 911 if ongoing pain 10/30/19  Yes Luking, Scott A, MD  rosuvastatin (CRESTOR) 20 MG tablet TAKE ONE TABLET DAILY 09/30/23  Yes Chandrasekhar, Mahesh A, MD  sildenafil (VIAGRA) 100 MG tablet Take 0.5-1 tablets (50-100 mg total) by mouth daily as needed for erectile dysfunction. 11/19/23  Yes Babs Sciara, MD  valsartan (DIOVAN) 160 MG tablet Take 160 mg by mouth daily.   Yes [provider]  valsartan-hydrochlorothiazide (DIOVAN-HCT) 160-12.5 MG tablet Take 1 tablet by mouth daily. Patient not taking: Reported on 01/02/2024 01/02/24   Babs Sciara, MD    Physical Exam: Vitals:   01/03/24 0415 01/03/24 0430 01/03/24 0445 01/03/24 0500  BP: 119/74 110/65 109/64 114/77  Pulse: (!) 57 (!) 52 (!) 55 (!) 56  Resp: 12 11 15 10   Temp:      TempSrc:      SpO2: 95% 97% 94% 96%  Weight:      Height:        Physical Exam Vitals reviewed.  Constitutional:      General: He is not in acute distress. HENT:     Head: Normocephalic and atraumatic.  Eyes:     Extraocular Movements: Extraocular movements intact.  Cardiovascular:     Rate  and Rhythm: Normal rate and regular rhythm.     Pulses: Normal pulses.  Pulmonary:     Effort: Pulmonary effort is normal. No respiratory distress.     Breath sounds: Normal breath sounds. No wheezing or rales.  Abdominal:     General: Bowel sounds are normal. There is no distension.     Palpations: Abdomen is soft.     Tenderness: There is no abdominal tenderness. There is no guarding.  Musculoskeletal:     Cervical back: Normal range of motion.     Right lower leg: No edema.     Left lower leg: No edema.  Skin:    General: Skin is warm and dry.  Neurological:     General: No focal deficit present.     Mental Status: He is alert and oriented to person, place, and time.     Labs on Admission: I  have personally reviewed following labs and imaging studies  CBC: Recent Labs  Lab 01/02/24 1720  WBC 8.6  HGB 15.9  HCT 45.7  MCV 88.4  PLT 260   Basic Metabolic Panel: Recent Labs  Lab 01/02/24 1720  NA 137  K 3.7  CL 99  CO2 27  GLUCOSE 175*  BUN 9  CREATININE 1.07  CALCIUM 10.0   GFR: Estimated Creatinine Clearance: 99.9 mL/min (by C-G formula based on SCr of 1.07 mg/dL). Liver Function Tests: Recent Labs  Lab 01/02/24 1720  AST 61*  ALT 71*  ALKPHOS 75  BILITOT 0.8  PROT 7.8  ALBUMIN 3.9   No results for input(s): "LIPASE", "AMYLASE" in the last 168 hours. No results for input(s): "AMMONIA" in the last 168 hours. Coagulation Profile: No results for input(s): "INR", "PROTIME" in the last 168 hours. Cardiac Enzymes: No results for input(s): "CKTOTAL", "CKMB", "CKMBINDEX", "TROPONINI" in the last 168 hours. BNP (last 3 results) No results for input(s): "PROBNP" in the last 8760 hours. HbA1C: Recent Labs    01/02/24 1720  HGBA1C 10.3*   CBG: No results for input(s): "GLUCAP" in the last 168 hours. Lipid Profile: Recent Labs    01/02/24 1943  CHOL 103  HDL 20*  LDLCALC 27  TRIG 604*  CHOLHDL 5.2   Thyroid Function Tests: No results for  input(s): "TSH", "T4TOTAL", "FREET4", "T3FREE", "THYROIDAB" in the last 72 hours. Anemia Panel: No results for input(s): "VITAMINB12", "FOLATE", "FERRITIN", "TIBC", "IRON", "RETICCTPCT" in the last 72 hours. Urine analysis: No results found for: "COLORURINE", "APPEARANCEUR", "LABSPEC", "PHURINE", "GLUCOSEU", "HGBUR", "BILIRUBINUR", "KETONESUR", "PROTEINUR", "UROBILINOGEN", "NITRITE", "LEUKOCYTESUR"  Radiological Exams on Admission: DG Chest 2 View Result Date: 01/02/2024 CLINICAL DATA:  Chest pain, hypertension EXAM: CHEST - 2 VIEW COMPARISON:  02/25/2023 FINDINGS: Frontal and lateral views of the chest demonstrate an unremarkable cardiac silhouette. No acute airspace disease, effusion, or pneumothorax. No acute bony abnormalities. IMPRESSION: 1. No acute intrathoracic process. Electronically Signed   By: Sharlet Salina M.D.   On: 01/02/2024 19:54    Assessment and Plan  Chest pain -History of CAD status post DES x 3 to LAD in 2018 with residual moderate mLCx disease 60% stenosis with FFR 0.84 in 2020 -EKG without acute ischemic changes and troponin negative x 3.  No longer having active chest pain at this time. -Cardiology consulted, appreciate recommendations -Echocardiogram -N.p.o. at midnight for possible coronary angiography in the morning -Status post aspirin 325 mg x 1 in the ED.  Cardiology will decide need for DAPT based on results of his coronary angiography. -Continue home Plavix 75 mg daily -Continue home metoprolol 25 mg twice daily  Hypertension -Continue metoprolol 25 mg twice daily, irbesartan 150 mg daily, and hydrochlorothiazide 12.5 mg daily.  Poorly controlled type 2 diabetes Glucose in the 170s and A1c 10.3.  Patient is not on medications for his diabetes.  Placed on sensitive sliding scale insulin every 4 hours for now as patient is NPO.  When no longer n.p.o., he will need to be started on long-acting insulin.  Mild transaminitis Slightly worse transaminitis  compared to labs done a year ago.  Alk phos and T. bili normal.  No abdominal pain or tenderness.  Hold Crestor at this time and monitor LFTs.  Anxiety Continue home Xanax PRN.  DVT prophylaxis: Lovenox Code Status: Full Code (discussed with the patient) Family Communication: No family available at this time. Level of care: Telemetry bed Admission status: It is my clinical opinion that referral for OBSERVATION  is reasonable and necessary in this patient based on the above information provided. The aforementioned taken together are felt to place the patient at high risk for further clinical deterioration. However, it is anticipated that the patient may be medically stable for discharge from the hospital within 24 to 48 hours.  John Giovanni MD [facility]  If 7PM-7AM, please contact night-coverage [email or web address]  01/03/2024, 5:21 AM

## 2024-01-03 NOTE — Progress Notes (Signed)
*  PRELIMINARY RESULTS* Echocardiogram 2D Echocardiogram has been attempted , pt in cath lab.  Rosemary Holms 01/03/2024, 1:21 PM

## 2024-01-03 NOTE — Progress Notes (Signed)
 DAILY PROGRESS NOTE   Patient Name: Bruce King Date of Encounter: 01/03/2024 Cardiologist: Christell Constant, MD  Chief Complaint   Chest pain has improved  Patient Profile   Bruce King is a 53 y.o. male with a hx of TUD, former PSUD, HLD, HTN, CAD s/p DESx3 to LAD in 2018 who is being seen 01/02/2024 for the evaluation of CP at the request of Dr. Jeraldine Loots.   Subjective   [name] reports some improvement in his chest pain today.  Troponins have been negative however they have been serially negative in the past.  Did have about a 60% stenosis during his last catheterization in the mid circumflex in 2020 and his symptoms have been progressively worsening over the past couple weeks.  This is concerning for unstable angina.  Objective   Vitals:   01/03/24 0617 01/03/24 0630 01/03/24 0730 01/03/24 0807  BP:  (!) 136/91 (!) 150/91   Pulse:  60 62   Resp:  19 14   Temp: 98.2 F (36.8 C)   (!) 97.3 F (36.3 C)  TempSrc: Oral   Oral  SpO2:  98% 98%   Weight:      Height:       No intake or output data in the 24 hours ending 01/03/24 0917 Filed Weights   01/02/24 1653  Weight: 99 kg    Physical Exam   General appearance: alert and no distress Lungs: clear to auscultation bilaterally Heart: regular rate and rhythm, S1, S2 normal, no murmur, click, rub or gallop Extremities: extremities normal, atraumatic, no cyanosis or edema Neurologic: Grossly normal  Inpatient Medications    Scheduled Meds:  clopidogrel  75 mg Oral Daily   enoxaparin (LOVENOX) injection  40 mg Subcutaneous Q24H   fenofibrate  160 mg Oral Daily   hydrochlorothiazide  12.5 mg Oral Daily   insulin aspart  0-9 Units Subcutaneous Q4H   irbesartan  150 mg Oral Daily   metoprolol tartrate  25 mg Oral BID    Continuous Infusions:   PRN Meds: acetaminophen **OR** acetaminophen, ALPRAZolam   Labs   Results for orders placed or performed during the hospital encounter of  01/02/24 (from the past 48 hours)  Basic metabolic panel     Status: Abnormal   Collection Time: 01/02/24  5:20 PM  Result Value Ref Range   Sodium 137 135 - 145 mmol/L   Potassium 3.7 3.5 - 5.1 mmol/L   Chloride 99 98 - 111 mmol/L   CO2 27 22 - 32 mmol/L   Glucose, Bld 175 (H) 70 - 99 mg/dL    Comment: Glucose reference range applies only to samples taken after fasting for at least 8 hours.   BUN 9 6 - 20 mg/dL   Creatinine, Ser 1.47 0.61 - 1.24 mg/dL   Calcium 82.9 8.9 - 56.2 mg/dL   GFR, Estimated >13 >08 mL/min    Comment: (NOTE) Calculated using the CKD-EPI Creatinine Equation (2021)    Anion gap 11 5 - 15    Comment: Performed at [facility], [address], [place], [place] [address]  CBC     Status: None   Collection Time: 01/02/24  5:20 PM  Result Value Ref Range   WBC 8.6 4.0 - 10.5 K/uL   RBC 5.17 4.22 - 5.81 MIL/uL   Hemoglobin 15.9 13.0 - 17.0 g/dL   HCT 69.6 29.5 - 28.4 %   MCV 88.4 80.0 - 100.0 fL   MCH  30.8 26.0 - 34.0 pg   MCHC 34.8 30.0 - 36.0 g/dL   RDW 81.1 91.4 - 78.2 %   Platelets 260 150 - 400 K/uL   nRBC 0.0 0.0 - 0.2 %    Comment: Performed at [facility], [address], [place], [place] [address]  Troponin I (High Sensitivity)     Status: None   Collection Time: 01/02/24  5:20 PM  Result Value Ref Range   Troponin I (High Sensitivity) 4 <18 ng/L    Comment: (NOTE) Elevated high sensitivity troponin I (hsTnI) values and significant  changes across serial measurements may suggest ACS but many other  chronic and acute conditions are known to elevate hsTnI results.  Refer to the "Links" section for chest pain algorithms and additional  guidance. Performed at [facility], [address] [place], [place] [address]   Hepatic function panel     Status: Abnormal   Collection Time: 01/02/24  5:20 PM  Result Value Ref Range   Total Protein 7.8 6.5 - 8.1 g/dL   Albumin 3.9 3.5 - 5.0 g/dL   AST 61 (H) 15 - 41 U/L   ALT 71 (H) 0 -  44 U/L   Alkaline Phosphatase 75 38 - 126 U/L   Total Bilirubin 0.8 0.0 - 1.2 mg/dL   Bilirubin, Direct 0.1 0.0 - 0.2 mg/dL   Indirect Bilirubin 0.7 0.3 - 0.9 mg/dL    Comment: Performed at [facility] Lab, [address] [place], [place] [address]  Hemoglobin A1c     Status: Abnormal   Collection Time: 01/02/24  5:20 PM  Result Value Ref Range   Hgb A1c MFr Bld 10.3 (H) 4.8 - 5.6 %    Comment: (NOTE) Pre diabetes:          5.7%-6.4%  Diabetes:              >6.4%  Glycemic control for   <7.0% adults with diabetes    Mean Plasma Glucose 248.91 mg/dL    Comment: Performed at [facility], [address] [place], [place] [address]  Lipid panel     Status: Abnormal   Collection Time: 01/02/24  7:43 PM  Result Value Ref Range   Cholesterol 103 0 - 200 mg/dL   Triglycerides 841 (H) <150 mg/dL   HDL 20 (L) >32 mg/dL   Total CHOL/HDL Ratio 5.2 RATIO   VLDL 56 (H) 0 - 40 mg/dL   LDL Cholesterol 27 0 - 99 mg/dL    Comment:        Total Cholesterol/HDL:CHD Risk Coronary Heart Disease Risk Table                     Men   Women  1/2 Average Risk   3.4   3.3  Average Risk       5.0   4.4  2 X Average Risk   9.6   7.1  3 X Average Risk  23.4   11.0        Use the calculated Patient Ratio above and the CHD Risk Table to determine the patient's CHD Risk.        ATP III CLASSIFICATION (LDL):  <100     mg/dL   Optimal  440-102  mg/dL   Near or Above                    Optimal  130-159  mg/dL  Borderline  160-189  mg/dL   High  >478     mg/dL   Very High Performed at [facility] Lab, [address] [place], [place] [address]   Troponin I (High Sensitivity)     Status: None   Collection Time: 01/02/24  7:49 PM  Result Value Ref Range   Troponin I (High Sensitivity) 5 <18 ng/L    Comment: (NOTE) Elevated high sensitivity troponin I (hsTnI) values and significant  changes across serial measurements may suggest ACS but many other  chronic and acute conditions are  known to elevate hsTnI results.  Refer to the "Links" section for chest pain algorithms and additional  guidance. Performed at [facility], [address], [place], [place] [address]   Troponin I (High Sensitivity)     Status: None   Collection Time: 01/02/24 11:11 PM  Result Value Ref Range   Troponin I (High Sensitivity) 5 <18 ng/L    Comment: (NOTE) Elevated high sensitivity troponin I (hsTnI) values and significant  changes across serial measurements may suggest ACS but many other  chronic and acute conditions are known to elevate hsTnI results.  Refer to the "Links" section for chest pain algorithms and additional  guidance. Performed at [facility], [address] [place], [place] [address]   Comprehensive metabolic panel     Status: Abnormal   Collection Time: 01/03/24  6:17 AM  Result Value Ref Range   Sodium 135 135 - 145 mmol/L   Potassium 3.7 3.5 - 5.1 mmol/L   Chloride 101 98 - 111 mmol/L   CO2 25 22 - 32 mmol/L   Glucose, Bld 158 (H) 70 - 99 mg/dL    Comment: Glucose reference range applies only to samples taken after fasting for at least 8 hours.   BUN 8 6 - 20 mg/dL   Creatinine, Ser 9.62 0.61 - 1.24 mg/dL   Calcium 9.0 8.9 - 95.2 mg/dL   Total Protein 6.8 6.5 - 8.1 g/dL   Albumin 3.5 3.5 - 5.0 g/dL   AST 65 (H) 15 - 41 U/L   ALT 59 (H) 0 - 44 U/L   Alkaline Phosphatase 63 38 - 126 U/L   Total Bilirubin 1.2 0.0 - 1.2 mg/dL   GFR, Estimated >84 >13 mL/min    Comment: (NOTE) Calculated using the CKD-EPI Creatinine Equation (2021)    Anion gap 9 5 - 15    Comment: Performed at [facility] Lab, [address], [place], [place] [address]  CBG monitoring, ED     Status: Abnormal   Collection Time: 01/03/24  8:06 AM  Result Value Ref Range   Glucose-Capillary 171 (H) 70 - 99 mg/dL    Comment: Glucose reference range applies only to samples taken after fasting for at least 8 hours.    ECG   Sinus rhythm at 79 (01/02/2024)- Personally  Reviewed  Telemetry   Sinus rhythm- Personally Reviewed  Radiology    DG Chest 2 View Result Date: 01/02/2024 CLINICAL DATA:  Chest pain, hypertension EXAM: CHEST - 2 VIEW COMPARISON:  02/25/2023 FINDINGS: Frontal and lateral views of the chest demonstrate an unremarkable cardiac silhouette. No acute airspace disease, effusion, or pneumothorax. No acute bony abnormalities. IMPRESSION: 1. No acute intrathoracic process. Electronically Signed   By: Sharlet Salina M.D.   On: 01/02/2024 19:54    Cardiac Studies   N/A  Assessment   Principal Problem:   Unstable angina [facility]) Active Problems:  Hyperlipidemia   CAD (coronary artery disease)   Essential hypertension   Type 2 diabetes mellitus (HCC)   Plan   Mr. Prom is describing crescendo chest pain over the past several weeks with a history of known coronary disease and moderate stenosis of the circumflex in 2020.  His symptoms are reminiscent of chest pain in the past although troponins are negative and there are no acute EKG changes.  Concern for unstable angina.  Discussed the risks and benefits of cardiac catheterization and he is agreeable to proceed.  Informed Consent   Shared Decision Making/Informed Consent The risks [stroke (1 in 1000), death (1 in 1000), kidney failure [usually temporary] (1 in 500), bleeding (1 in 200), allergic reaction [possibly serious] (1 in 200)], benefits (diagnostic support and management of coronary artery disease) and alternatives of a cardiac catheterization were discussed in detail with Mr. Bedoy and he is willing to proceed.    Time Spent Directly with Patient:  I have spent a total of 25 minutes with the patient reviewing hospital notes, telemetry, EKGs, labs and examining the patient as well as establishing an assessment and plan that was discussed personally with the patient.  > 50% of time was spent in direct patient care.  Length of Stay:  LOS: 0 days   Chrystie Nose, MD, [facility],  FACP  [place]  [facility]  Medical Director of the Advanced Lipid Disorders &  Cardiovascular Risk Reduction Clinic Diplomate of the American Board of Clinical Lipidology Attending Cardiologist  Direct Dial: [phone]  Fax: [phone]  Website:  [email or web address] 01/03/2024, 9:17 AM

## 2024-01-03 NOTE — H&P (View-Only) (Signed)
 DAILY PROGRESS NOTE   Patient Name: Bruce King Date of Encounter: 01/03/2024 Cardiologist: Christell Constant, MD  Chief Complaint   Chest pain has improved  Patient Profile   Bruce King is a 53 y.o. male with a hx of TUD, former PSUD, HLD, HTN, CAD s/p DESx3 to LAD in 2018 who is being seen 01/02/2024 for the evaluation of CP at the request of Dr. Jeraldine Loots.   Subjective   Bruce King reports some improvement in his chest pain today.  Troponins have been negative however they have been serially negative in the past.  Did have about a 60% stenosis during his last catheterization in the mid circumflex in 2020 and his symptoms have been progressively worsening over the past couple weeks.  This is concerning for unstable angina.  Objective   Vitals:   01/03/24 0617 01/03/24 0630 01/03/24 0730 01/03/24 0807  BP:  (!) 136/91 (!) 150/91   Pulse:  60 62   Resp:  19 14   Temp: 98.2 F (36.8 C)   (!) 97.3 F (36.3 C)  TempSrc: Oral   Oral  SpO2:  98% 98%   Weight:      Height:       No intake or output data in the 24 hours ending 01/03/24 0917 Filed Weights   01/02/24 1653  Weight: 99 kg    Physical Exam   General appearance: alert and no distress Lungs: clear to auscultation bilaterally Heart: regular rate and rhythm, S1, S2 normal, no murmur, click, rub or gallop Extremities: extremities normal, atraumatic, no cyanosis or edema Neurologic: Grossly normal  Inpatient Medications    Scheduled Meds:  clopidogrel  75 mg Oral Daily   enoxaparin (LOVENOX) injection  40 mg Subcutaneous Q24H   fenofibrate  160 mg Oral Daily   hydrochlorothiazide  12.5 mg Oral Daily   insulin aspart  0-9 Units Subcutaneous Q4H   irbesartan  150 mg Oral Daily   metoprolol tartrate  25 mg Oral BID    Continuous Infusions:   PRN Meds: acetaminophen **OR** acetaminophen, ALPRAZolam   Labs   Results for orders placed or performed during the hospital encounter of  01/02/24 (from the past 48 hours)  Basic metabolic panel     Status: Abnormal   Collection Time: 01/02/24  5:20 PM  Result Value Ref Range   Sodium 137 135 - 145 mmol/L   Potassium 3.7 3.5 - 5.1 mmol/L   Chloride 99 98 - 111 mmol/L   CO2 27 22 - 32 mmol/L   Glucose, Bld 175 (H) 70 - 99 mg/dL    Comment: Glucose reference range applies only to samples taken after fasting for at least 8 hours.   BUN 9 6 - 20 mg/dL   Creatinine, Ser 1.47 0.61 - 1.24 mg/dL   Calcium 82.9 8.9 - 56.2 mg/dL   GFR, Estimated >13 >08 mL/min    Comment: (NOTE) Calculated using the CKD-EPI Creatinine Equation (2021)    Anion gap 11 5 - 15    Comment: Performed at [facility], [address], [place], [place] [address]  CBC     Status: None   Collection Time: 01/02/24  5:20 PM  Result Value Ref Range   WBC 8.6 4.0 - 10.5 K/uL   RBC 5.17 4.22 - 5.81 MIL/uL   Hemoglobin 15.9 13.0 - 17.0 g/dL   HCT 69.6 29.5 - 28.4 %   MCV 88.4 80.0 - 100.0 fL   MCH  30.8 26.0 - 34.0 pg   MCHC 34.8 30.0 - 36.0 g/dL   RDW 81.1 91.4 - 78.2 %   Platelets 260 150 - 400 K/uL   nRBC 0.0 0.0 - 0.2 %    Comment: Performed at [facility], [address], [place], [place] [address]  Troponin I (High Sensitivity)     Status: None   Collection Time: 01/02/24  5:20 PM  Result Value Ref Range   Troponin I (High Sensitivity) 4 <18 ng/L    Comment: (NOTE) Elevated high sensitivity troponin I (hsTnI) values and significant  changes across serial measurements may suggest ACS but many other  chronic and acute conditions are known to elevate hsTnI results.  Refer to the "Links" section for chest pain algorithms and additional  guidance. Performed at [facility], [address] [place], [place] [address]   Hepatic function panel     Status: Abnormal   Collection Time: 01/02/24  5:20 PM  Result Value Ref Range   Total Protein 7.8 6.5 - 8.1 g/dL   Albumin 3.9 3.5 - 5.0 g/dL   AST 61 (H) 15 - 41 U/L   ALT 71 (H) 0 -  44 U/L   Alkaline Phosphatase 75 38 - 126 U/L   Total Bilirubin 0.8 0.0 - 1.2 mg/dL   Bilirubin, Direct 0.1 0.0 - 0.2 mg/dL   Indirect Bilirubin 0.7 0.3 - 0.9 mg/dL    Comment: Performed at [facility], [address] [place], [place] [address]  Hemoglobin A1c     Status: Abnormal   Collection Time: 01/02/24  5:20 PM  Result Value Ref Range   Hgb A1c MFr Bld 10.3 (H) 4.8 - 5.6 %    Comment: (NOTE) Pre diabetes:          5.7%-6.4%  Diabetes:              >6.4%  Glycemic control for   <7.0% adults with diabetes    Mean Plasma Glucose 248.91 mg/dL    Comment: Performed at [facility], [address] [place], [place] [address]  Lipid panel     Status: Abnormal   Collection Time: 01/02/24  7:43 PM  Result Value Ref Range   Cholesterol 103 0 - 200 mg/dL   Triglycerides 841 (H) <150 mg/dL   HDL 20 (L) >32 mg/dL   Total CHOL/HDL Ratio 5.2 RATIO   VLDL 56 (H) 0 - 40 mg/dL   LDL Cholesterol 27 0 - 99 mg/dL    Comment:        Total Cholesterol/HDL:CHD Risk Coronary Heart Disease Risk Table                     Men   Women  1/2 Average Risk   3.4   3.3  Average Risk       5.0   4.4  2 X Average Risk   9.6   7.1  3 X Average Risk  23.4   11.0        Use the calculated Patient Ratio above and the CHD Risk Table to determine the patient's CHD Risk.        ATP III CLASSIFICATION (LDL):  <100     mg/dL   Optimal  440-102  mg/dL   Near or Above                    Optimal  130-159  mg/dL  Borderline  160-189  mg/dL   High  >478     mg/dL   Very High Performed at [facility], [address] [place], [place] [address]   Troponin I (High Sensitivity)     Status: None   Collection Time: 01/02/24  7:49 PM  Result Value Ref Range   Troponin I (High Sensitivity) 5 <18 ng/L    Comment: (NOTE) Elevated high sensitivity troponin I (hsTnI) values and significant  changes across serial measurements may suggest ACS but many other  chronic and acute conditions are  known to elevate hsTnI results.  Refer to the "Links" section for chest pain algorithms and additional  guidance. Performed at [facility], [address], [place], [place] [address]   Troponin I (High Sensitivity)     Status: None   Collection Time: 01/02/24 11:11 PM  Result Value Ref Range   Troponin I (High Sensitivity) 5 <18 ng/L    Comment: (NOTE) Elevated high sensitivity troponin I (hsTnI) values and significant  changes across serial measurements may suggest ACS but many other  chronic and acute conditions are known to elevate hsTnI results.  Refer to the "Links" section for chest pain algorithms and additional  guidance. Performed at [facility], [address] [place], [place] [address]   Comprehensive metabolic panel     Status: Abnormal   Collection Time: 01/03/24  6:17 AM  Result Value Ref Range   Sodium 135 135 - 145 mmol/L   Potassium 3.7 3.5 - 5.1 mmol/L   Chloride 101 98 - 111 mmol/L   CO2 25 22 - 32 mmol/L   Glucose, Bld 158 (H) 70 - 99 mg/dL    Comment: Glucose reference range applies only to samples taken after fasting for at least 8 hours.   BUN 8 6 - 20 mg/dL   Creatinine, Ser 9.62 0.61 - 1.24 mg/dL   Calcium 9.0 8.9 - 95.2 mg/dL   Total Protein 6.8 6.5 - 8.1 g/dL   Albumin 3.5 3.5 - 5.0 g/dL   AST 65 (H) 15 - 41 U/L   ALT 59 (H) 0 - 44 U/L   Alkaline Phosphatase 63 38 - 126 U/L   Total Bilirubin 1.2 0.0 - 1.2 mg/dL   GFR, Estimated >84 >13 mL/min    Comment: (NOTE) Calculated using the CKD-EPI Creatinine Equation (2021)    Anion gap 9 5 - 15    Comment: Performed at [facility], [address], [place], [place] [address]  CBG monitoring, ED     Status: Abnormal   Collection Time: 01/03/24  8:06 AM  Result Value Ref Range   Glucose-Capillary 171 (H) 70 - 99 mg/dL    Comment: Glucose reference range applies only to samples taken after fasting for at least 8 hours.    ECG   Sinus rhythm at 79 (01/02/2024)- Personally  Reviewed  Telemetry   Sinus rhythm- Personally Reviewed  Radiology    DG Chest 2 View Result Date: 01/02/2024 CLINICAL DATA:  Chest pain, hypertension EXAM: CHEST - 2 VIEW COMPARISON:  02/25/2023 FINDINGS: Frontal and lateral views of the chest demonstrate an unremarkable cardiac silhouette. No acute airspace disease, effusion, or pneumothorax. No acute bony abnormalities. IMPRESSION: 1. No acute intrathoracic process. Electronically Signed   By: Sharlet Salina M.D.   On: 01/02/2024 19:54    Cardiac Studies   N/A  Assessment   Principal Problem:   Unstable angina [facility]) Active Problems:  Hyperlipidemia   CAD (coronary artery disease)   Essential hypertension   Type 2 diabetes mellitus (HCC)   Plan   Bruce King is describing crescendo chest pain over the past several weeks with a history of known coronary disease and moderate stenosis of the circumflex in 2020.  His symptoms are reminiscent of chest pain in the past although troponins are negative and there are no acute EKG changes.  Concern for unstable angina.  Discussed the risks and benefits of cardiac catheterization and he is agreeable to proceed.  Informed Consent   Shared Decision Making/Informed Consent The risks [stroke (1 in 1000), death (1 in 1000), kidney failure [usually temporary] (1 in 500), bleeding (1 in 200), allergic reaction [possibly serious] (1 in 200)], benefits (diagnostic support and management of coronary artery disease) and alternatives of a cardiac catheterization were discussed in detail with Bruce King and he is willing to proceed.    Time Spent Directly with Patient:  I have spent a total of 25 minutes with the patient reviewing hospital notes, telemetry, EKGs, labs and examining the patient as well as establishing an assessment and plan that was discussed personally with the patient.  > 50% of time was spent in direct patient care.  Length of Stay:  LOS: 0 days   Chrystie Nose, MD, [facility],  FACP  [place]  [facility]  Medical Director of the [facility] Lipid Disorders &  Cardiovascular Risk Reduction Clinic Diplomate of the American Board of Clinical Lipidology Attending Cardiologist  Direct Dial: [phone]  Fax: [phone]  Website:  [email or web address] 01/03/2024, 9:17 AM

## 2024-01-04 ENCOUNTER — Observation Stay (HOSPITAL_COMMUNITY): Payer: Self-pay

## 2024-01-04 DIAGNOSIS — I25110 Atherosclerotic heart disease of native coronary artery with unstable angina pectoris: Secondary | ICD-10-CM

## 2024-01-04 DIAGNOSIS — R079 Chest pain, unspecified: Secondary | ICD-10-CM

## 2024-01-04 LAB — ECHOCARDIOGRAM COMPLETE
AR max vel: 3.39 cm2
AV Peak grad: 2.6 mmHg
Ao pk vel: 0.81 m/s
Area-P 1/2: 2.39 cm2
Est EF: 60
Height: 73 in
S' Lateral: 3.7 cm
Weight: 3478.4 [oz_av]

## 2024-01-04 LAB — MAGNESIUM: Magnesium: 2 mg/dL (ref 1.7–2.4)

## 2024-01-04 LAB — BASIC METABOLIC PANEL WITH GFR
Anion gap: 11 (ref 5–15)
BUN: 9 mg/dL (ref 6–20)
CO2: 30 mmol/L (ref 22–32)
Calcium: 9 mg/dL (ref 8.9–10.3)
Chloride: 96 mmol/L — ABNORMAL LOW (ref 98–111)
Creatinine, Ser: 1.24 mg/dL (ref 0.61–1.24)
GFR, Estimated: >60 mL/min (ref >60)
Glucose, Bld: 148 mg/dL — ABNORMAL HIGH (ref 70–99)
Potassium: 4.1 mmol/L (ref 3.5–5.1)
Sodium: 137 mmol/L (ref 135–145)

## 2024-01-04 LAB — CBC
HCT: 46 % (ref 39.0–52.0)
Hemoglobin: 16.1 g/dL (ref 13.0–17.0)
MCH: 30.6 pg (ref 26.0–34.0)
MCHC: 35 g/dL (ref 30.0–36.0)
MCV: 87.3 fL (ref 80.0–100.0)
Platelets: 248 10*3/uL (ref 150–400)
RBC: 5.27 MIL/uL (ref 4.22–5.81)
RDW: 13.2 % (ref 11.5–15.5)
WBC: 8.1 10*3/uL (ref 4.0–10.5)
nRBC: 0 % (ref 0.0–0.2)

## 2024-01-04 LAB — GLUCOSE, CAPILLARY
Glucose-Capillary: 113 mg/dL — ABNORMAL HIGH (ref 70–99)
Glucose-Capillary: 155 mg/dL — ABNORMAL HIGH (ref 70–99)
Glucose-Capillary: 202 mg/dL — ABNORMAL HIGH (ref 70–99)

## 2024-01-04 MED ORDER — METFORMIN HCL 500 MG PO TABS: 500.0000 mg | ORAL_TABLET | Freq: Two times a day (BID) | ORAL | 0 refills | Status: DC

## 2024-01-04 MED ORDER — FENOFIBRATE 160 MG PO TABS: 160.0000 mg | ORAL_TABLET | Freq: Every day | ORAL | 0 refills | Status: DC

## 2024-01-04 MED ORDER — METFORMIN HCL 500 MG PO TABS: 500.0000 mg | ORAL_TABLET | Freq: Two times a day (BID) | ORAL | 0 refills | Status: AC

## 2024-01-04 MED ORDER — DAPAGLIFLOZIN PROPANEDIOL 10 MG PO TABS: 10.0000 mg | ORAL_TABLET | Freq: Every day | ORAL | Status: DC

## 2024-01-04 NOTE — Discharge Summary (Signed)
 Physician Discharge Summary  Bruce King GNF:[id number] DOB: 11/17/1970 DOA: 01/02/2024  PCP: Bennet Brasil, MD  Admit date: 01/02/2024 Discharge date: 01/04/2024  Time spent: 60 minutes  Recommendations for Outpatient Follow-up:  Follow-up with Charles Connor, NP cardiology on 01/24/2024 Follow-up with Bennet Brasil, MD in 1 to 2 weeks.  On follow-up patient is newly diagnosed diabetes will need to be followed up upon with further management.  Patient started on metformin 500 mg twice daily on discharge.  Patient will need a comprehensive metabolic profile done as well as a magnesium level done to follow-up on electrolytes and renal function.  Patient's blood pressure needs to be reassessed on follow-up.   Discharge Diagnoses:  Principal Problem:   Unstable angina (HCC) Active Problems:   Hyperlipidemia   CAD (coronary artery disease)   Essential hypertension   Type 2 diabetes mellitus (HCC)   Discharge Condition: Stable and improved  Diet recommendation: Carb modified diet  Filed Weights   01/02/24 1653 01/03/24 1424  Weight: 99 kg 98.6 kg    History of present illness:  HPI per Dr. Ladonna Pickup is a 53 y.o. male with medical history significant of CAD status post PCI in 2018, hypertension, hyperlipidemia, type 2 diabetes, anxiety/depression, GERD, hiatal hernia, tobacco abuse presented to the ED with a chief complaint of chest pain.  Hypertensive on arrival with blood pressure 157/109.  Not hypoxic.  EKG showing sinus rhythm and no acute ischemic changes.  Labs notable for glucose 175, troponin negative x 3, transaminases mildly elevated but alk phos and T. bili normal. Chest x-ray negative for acute finding. Cardiology consulted and patient was given aspirin 325 mg, hydrochlorothiazide 12.5 mg, metoprolol 25 mg, Xanax 0.5 mg, and sublingual nitroglycerin in the ED.  [facility] called to admit.   Patient states he has been having nonexertional chest pain on and off for  the past few weeks.  He describes it as substernal sharp chest pain associated with burning pain in both of his shoulders.  No associated shortness of breath, nausea, or diaphoresis.  Chest pain episodes became more frequent over the past few weeks so he spoke to his cardiologist and was sent to the ED for further evaluation as he had similar episodes of chest pain when he had his cardiac stents placed back in 2018.  Not having active chest pain at this time.  Also reporting elevated blood pressure with systolic in the 140s to 150s at home recently.  Patient states he was previously on valsartan-hydrochlorothiazide combination pill which was stopped by his physician 2 months ago as his blood pressure was running low at that time with systolic 90-100.  He is currently taking valsartan and metoprolol only for high blood pressure.  No other complaints.  Hospital Course:  #1 unstable angina/CAD -Patient noted to have presented with nonexertional chest pain ongoing for the past few weeks that he describes as a sharp sternal chest pain which started selves as burning in both shoulders radiating to his mid chest similar to prior symptoms she has had for prior MI. -Patient admitted, cardiac enzymes cycled which were negative. -2D echo obtained with a EF of 60%,NWMA of the left ventricle, grade 1 diastolic dysfunction, small anterior septal wall motion abnormality noted. -Patient seen in consultation by cardiology who recommended cardiac catheterization to assess LAD stents. -Patient given a dose of aspirin and maintained on home regimen of Plavix 75 mg daily. -Patient subsequently continued home regimen of metoprolol as well as valsartan.  Patient also maintained on rosuvastatin. -Patient underwent cardiac catheterization on 01/03/2024 which showed moderate CAD involving all 3 coronary arteries, previously placed stent in the proximal LAD was widely patent, previously placed stent in the mid LAD widely patent.   Second marginal branch which was severely diseased in the terminal aspect of the vessel was too small to consider PCI. -Medical management recommended per cardiology. -Patient cleared by cardiology and patient will be discharged in stable improved condition with outpatient follow-up with cardiology.  2.  New onset diabetes mellitus type 2 -Patient noted to have new onset diabetes mellitus type 2, stated and never been diagnosed with diabetes. -Hemoglobin A1c noted at 10.3. -Patient maintained on sliding scale insulin during the hospitalization and needed low doses of sliding scale insulin. -Patient will be discharged on metformin 500 mg twice daily to be started 48 hours postdischarge with close outpatient follow-up with PCP for further management of new onset type 2 diabetes. -Patient seen by diabetic coordinator during the hospitalization. -Outpatient follow-up with PCP.  3.  Hypertension -Patient maintained on home regimen metoprolol 25 mg twice daily, irbesartan 150 mg daily and HCTZ 12.5 mg daily. -Patient will discharge on home regimen of metoprolol 25 mg twice daily as well as home regimen of valsartan 160 mg daily. -Outpatient follow-up with PCP.  4.  Mild transaminitis - Statin held during the hospitalization and will be resumed on discharge. -Outpatient follow-up with PCP.  5. Anxiety -Patient maintained on home regimen Xanax as needed.  Procedures: Chest x-ray 01/02/2024 2D echo 01/04/2024 Cardiac catheterization 01/03/2024  Consultations: Cardiology: Dr. Austine Blunt 01/02/2024  Discharge Exam: Vitals:   01/04/24 0424 01/04/24 1022  BP: 114/71 (!) 142/92  Pulse: 62 73  Resp: 20   Temp: 97.9 F (36.6 C)   SpO2: 95%     General: NAD Cardiovascular: RRR no murmurs rubs or gallops.  No JVD.  No lower extremity edema. Respiratory: To auscultation bilaterally.  No wheezes, no crackles, no rhonchi.  Fair air movement.  Speaking in full sentences.  Discharge  Instructions   Discharge Instructions     Diet Carb Modified   Complete by: As directed    Increase activity slowly   Complete by: As directed       Allergies as of 01/04/2024       Reactions   Bactrim [sulfamethoxazole-trimethoprim] Other (See Comments)   Felt Drunk   Lipitor [atorvastatin] Other (See Comments)   Stiffness in joints   Pravastatin Other (See Comments)   stiffness        Medication List     STOP taking these medications    valsartan-hydrochlorothiazide 160-12.5 MG tablet Commonly known as: DIOVAN-HCT       TAKE these medications    acetaminophen 500 MG tablet Commonly known as: TYLENOL Take 1,000 mg by mouth every 6 (six) hours as needed.   ALPRAZolam 0.5 MG tablet Commonly known as: XANAX TAKE ONE TABLET TWICE DAILY AS NEEDED What changed: additional instructions   clopidogrel 75 MG tablet Commonly known as: PLAVIX TAKE ONE TABLET DAILY   fenofibrate 160 MG tablet Take 1 tablet (160 mg total) by mouth daily. Start taking on: January 05, 2024   metFORMIN 500 MG tablet Commonly known as: GLUCOPHAGE Take 1 tablet (500 mg total) by mouth 2 (two) times daily with a meal.   metoprolol tartrate 25 MG tablet Commonly known as: LOPRESSOR Take 1 tablet (25 mg total) by mouth 2 (two) times daily.   nitroGLYCERIN 0.4 MG SL tablet Commonly  known as: NITROSTAT 1 every 5 minutes SL as needed chest discomfort maximum 3 call 911 if ongoing pain   rosuvastatin 20 MG tablet Commonly known as: CRESTOR TAKE ONE TABLET DAILY   sildenafil 100 MG tablet Commonly known as: Viagra Take 0.5-1 tablets (50-100 mg total) by mouth daily as needed for erectile dysfunction.   valsartan 160 MG tablet Commonly known as: DIOVAN Take 160 mg by mouth daily.       Allergies  Allergen Reactions   Bactrim [Sulfamethoxazole-Trimethoprim] Other (See Comments)    Felt Drunk   Lipitor [Atorvastatin] Other (See Comments)    Stiffness in joints   Pravastatin  Other (See Comments)    stiffness    Follow-up Information     Gerald Kitty., NP Follow up on 01/24/2024.   Specialty: Cardiology Why: 1:55PM. Cardiology post hospital follow up Contact information: [address] [place] [address] [phone]         Bennet Brasil, MD. Schedule an appointment as soon as possible for a visit in 1 week(s).   Specialty: Family Medicine Why: f/u in 1-2 weeks. Contact information: [address] [place] [address] [phone]                  The results of significant diagnostics from this hospitalization (including imaging, microbiology, ancillary and laboratory) are listed below for reference.    Significant Diagnostic Studies: ECHOCARDIOGRAM COMPLETE Result Date: 01/04/2024    ECHOCARDIOGRAM REPORT   Patient Name:   Salah D Guerrera Date of Exam: 01/04/2024 Medical Rec #:  [id number]          Height:       73.0 in Accession #:    [id number]         Weight:       217.4 lb Date of Birth:  Oct 15, 1970           BSA:          2.229 m Patient Age:    53 years           BP:           114/71 mmHg Patient Gender: M                  HR:           71 bpm. Exam Location:  Inpatient Procedure: 2D Echo, Cardiac Doppler and Color Doppler (Both Spectral and Color            Flow Doppler were utilized during procedure). Indications:    Chest Pain R07.9  History:        Patient has prior history of Echocardiogram examinations, most                 recent 11/01/2016. Previous Myocardial Infarction, CAD and Angina,                 Signs/Symptoms:Chest Pain; Risk Factors:Hypertension, Diabetes,                 Dyslipidemia and Current Smoker.  Sonographer:    Terrilee Few RCS Referring Phys: [id number] CHRISTOPHER D MCALHANY IMPRESSIONS  1. Left ventricular ejection fraction, by estimation, is 60%. The left ventricle has normal function. The left ventricle has no regional wall motion abnormalities. Left ventricular diastolic  parameters are consistent with Grade I diastolic dysfunction (impaired relaxation). Small anteroseptal wall motion abnormality.  2. Right ventricular systolic function is normal. The right ventricular size  is normal.  3. The mitral valve is normal in structure. No evidence of mitral valve regurgitation.  4. The aortic valve is tricuspid. Aortic valve regurgitation is not visualized. No aortic stenosis is present. Comparison(s): Prior images reviewed side by side. Subtle wall motion abnormality new from 2018. FINDINGS  Left Ventricle: No strain or 3D transmitted. Left ventricular ejection fraction, by estimation, is 60%. The left ventricle has normal function. The left ventricle has no regional wall motion abnormalities. Strain was performed and the global longitudinal strain is indeterminate. The left ventricular internal cavity size was normal in size. There is no left ventricular hypertrophy. Left ventricular diastolic parameters are consistent with Grade I diastolic dysfunction (impaired relaxation).  LV Wall Scoring: The mid and distal anterior septum is hypokinetic. The entire anterior wall, entire lateral wall, entire inferior wall, basal anteroseptal segment, mid inferoseptal segment, basal inferoseptal segment, and apex are normal. Small anteroseptal wall motion abnormality. Right Ventricle: The right ventricular size is normal. No increase in right ventricular wall thickness. Right ventricular systolic function is normal. Left Atrium: Left atrial size was normal in size. Right Atrium: Right atrial size was normal in size. Pericardium: There is no evidence of pericardial effusion. Presence of epicardial fat layer. Mitral Valve: The mitral valve is normal in structure. No evidence of mitral valve regurgitation. Tricuspid Valve: The tricuspid valve is normal in structure. Tricuspid valve regurgitation is not demonstrated. No evidence of tricuspid stenosis. Aortic Valve: The aortic valve is tricuspid. Aortic  valve regurgitation is not visualized. No aortic stenosis is present. Aortic valve peak gradient measures 2.6 mmHg. Pulmonic Valve: The pulmonic valve was normal in structure. Pulmonic valve regurgitation is not visualized. No evidence of pulmonic stenosis. Aorta: The aortic root and ascending aorta are structurally normal, with no evidence of dilitation. IAS/Shunts: The atrial septum is grossly normal. Additional Comments: 3D was performed not requiring image post processing on an independent workstation and was indeterminate.  LEFT VENTRICLE PLAX 2D LVIDd:         5.40 cm   Diastology LVIDs:         3.70 cm   LV e' medial:    5.77 cm/s LV PW:         0.90 cm   LV E/e' medial:  7.1 LV IVS:        0.90 cm   LV e' lateral:   14.60 cm/s LVOT diam:     2.00 cm   LV E/e' lateral: 2.8 LV SV:         49 LV SV Index:   22 LVOT Area:     3.14 cm  RIGHT VENTRICLE            IVC RV S prime:     7.94 cm/s  IVC diam: 2.00 cm TAPSE (M-mode): 2.2 cm LEFT ATRIUM           Index        RIGHT ATRIUM           Index LA diam:      2.70 cm 1.21 cm/m   RA Area:     10.30 cm LA Vol (A2C): 12.7 ml 5.70 ml/m   RA Volume:   18.60 ml  8.34 ml/m LA Vol (A4C): 24.6 ml 11.03 ml/m  AORTIC VALVE AV Area (Vmax): 3.39 cm AV Vmax:        80.70 cm/s AV Peak Grad:   2.6 mmHg LVOT Vmax:      87.00 cm/s LVOT Vmean:  55.500 cm/s LVOT VTI:       0.156 m  AORTA Ao Root diam: 3.80 cm Ao Asc diam:  3.40 cm MITRAL VALVE MV Area (PHT): 2.39 cm    SHUNTS MV Decel Time: 317 msec    Systemic VTI:  0.16 m MV E velocity: 41.10 cm/s  Systemic Diam: 2.00 cm MV A velocity: 53.90 cm/s MV E/A ratio:  0.76 Gloriann Larger MD Electronically signed by Gloriann Larger MD Signature Date/Time: 01/04/2024/1:56:30 PM    Final    CARDIAC CATHETERIZATION Result Date: 01/03/2024   RPAV lesion is 45% stenosed.   Mid Cx lesion is 60% stenosed.   Ost 1st Diag lesion is 50% stenosed.   Ost 2nd Diag lesion is 55% stenosed.   1st RPL lesion is 60% stenosed.   Ost  Cx to Prox Cx lesion is 20% stenosed.   2nd Mrg lesion is 99% stenosed.   Previously placed Prox LAD to Mid LAD stent of unknown type is  widely patent with 0% stenosed side branch in Ost 2nd Sept.   Previously placed Mid LAD to [facility] LAD stent of unknown type is  widely patent. Patent mid and distal LAD stents without restenosis. The small caliber first and second Diagonal branches are jailed by the LAD stent but have good flow. The Circumflex has a moderate stenosis mid vessel stenosis, unchanged from last cath. The obtuse marginal branch bifurcates. The most distal segment of the superior branch of the obtuse marginal has severe stenosis (too small for PCI). The large dominant RCA has diffuse mild disease in the proximal and mid vessel. The posterolateral artery has moderate non-obstructive disease. The small caliber PDA has a moderate stenosis. Normal LV filling pressure. Recommendations: Medical management of CAD.   DG Chest 2 View Result Date: 01/02/2024 CLINICAL DATA:  Chest pain, hypertension EXAM: CHEST - 2 VIEW COMPARISON:  02/25/2023 FINDINGS: Frontal and lateral views of the chest demonstrate an unremarkable cardiac silhouette. No acute airspace disease, effusion, or pneumothorax. No acute bony abnormalities. IMPRESSION: 1. No acute intrathoracic process. Electronically Signed   By: Bobbye Burrow M.D.   On: 01/02/2024 19:54    Microbiology: No results found for this or any previous visit (from the past 240 hours).   Labs: Basic Metabolic Panel: Recent Labs  Lab 01/02/24 1720 01/03/24 0617 01/04/24 0616  NA 137 135 137  K 3.7 3.7 4.1  CL 99 101 96*  CO2 27 25 30   GLUCOSE 175* 158* 148*  BUN 9 8 9   CREATININE 1.07 1.11 1.24  CALCIUM 10.0 9.0 9.0  MG  --   --  2.0   Liver Function Tests: Recent Labs  Lab 01/02/24 1720 01/03/24 0617  AST 61* 65*  ALT 71* 59*  ALKPHOS 75 63  BILITOT 0.8 1.2  PROT 7.8 6.8  ALBUMIN 3.9 3.5   No results for input(s): "LIPASE", "AMYLASE" in  the last 168 hours. No results for input(s): "AMMONIA" in the last 168 hours. CBC: Recent Labs  Lab 01/02/24 1720 01/04/24 0616  WBC 8.6 8.1  HGB 15.9 16.1  HCT 45.7 46.0  MCV 88.4 87.3  PLT 260 248   Cardiac Enzymes: No results for input(s): "CKTOTAL", "CKMB", "CKMBINDEX", "TROPONINI" in the last 168 hours. BNP: BNP (last 3 results) No results for input(s): "BNP" in the last 8760 hours.  ProBNP (last 3 results) No results for input(s): "PROBNP" in the last 8760 hours.  CBG: Recent Labs  Lab 01/03/24 2019 01/03/24 2146 01/04/24 0247 01/04/24 0801  01/04/24 1158  GLUCAP 110* 103* 113* 155* 202*       Signed:  Hilda Lovings MD.  [facility] 01/04/2024, 3:36 PM

## 2024-01-04 NOTE — Progress Notes (Signed)
 DISCHARGE NOTE HOME Bruce King to be discharged Home per MD order. Discussed prescriptions and follow up appointments with the patient. Prescriptions given to patient; medication list explained in detail. Patient verbalized understanding.  Skin clean, dry and intact without evidence of skin break down, no evidence of skin tears noted. IV catheter discontinued intact. Site without signs and symptoms of complications. Dressing and pressure applied. Pt denies pain at the site currently. No complaints noted.  Patient free of lines, drains, and wounds.   An After Visit Summary (AVS) was printed and given to the patient. Patient escorted via wheelchair, and discharged home via private auto.  Tonda Francisco, RN

## 2024-01-04 NOTE — Progress Notes (Addendum)
 Rounding Note    Patient Name: Bruce King Date of Encounter: 01/04/2024  [facility] Cardiologist: Jann Melody, MD    Subjective   53 year old gentleman with a history of hypertension, hyperlipidemia, coronary artery disease with seen yesterday for episodes of chest discomfort.  Heart catheterization performed yesterday reveals moderate coronary artery disease involving all 3 coronary arteries.  She has a previously placed stent in the proximal LAD which is widely patent. She has a previously placed stent in the mid LAD which is also widely patent. There was a second marginal branch which was severely diseased in the terminal aspect of the vessel but was too small to consider PCI.  Medical management was recommended.  Troponins have been negative.     Lipids look well-controlled.  He has newly diagnosed diabetes mellitus and he is a smoker.  I have counseled him about smoking cessation.  He will check in with his primary medical doctor regarding control of his diabetes.  He works at the [employer].  He will need a note to return to work on Thursday, January 09, 2024.  He needs to ambulate in the halls before discharge.  Inpatient Medications    Scheduled Meds:  clopidogrel  75 mg Oral Daily   enoxaparin (LOVENOX) injection  40 mg Subcutaneous Q24H   fenofibrate  160 mg Oral Daily   hydrochlorothiazide  12.5 mg Oral Daily   insulin aspart  0-9 Units Subcutaneous TID WC   irbesartan  150 mg Oral Daily   living well with diabetes book   Does not apply Once   metoprolol tartrate  25 mg Oral BID   sodium chloride flush  3 mL Intravenous Q12H   Continuous Infusions:  sodium chloride     PRN Meds: sodium chloride, acetaminophen **OR** acetaminophen, ALPRAZolam, sodium chloride flush   Vital Signs    Vitals:   01/03/24 1500 01/03/24 1525 01/03/24 1900 01/04/24 0424  BP: (!) 134/97 (!) 147/95 126/71 114/71  Pulse: 63 (!) 58 64 62  Resp:    20 20  Temp:   97.8 F (36.6 C) 97.9 F (36.6 C)  TempSrc:   Oral Oral  SpO2: 97% 94% 98% 95%  Weight:      Height:        Intake/Output Summary (Last 24 hours) at 01/04/2024 0909 Last data filed at 01/03/2024 1525 Gross per 24 hour  Intake 480 ml  Output 250 ml  Net 230 ml      01/03/2024    2:24 PM 01/02/2024    4:53 PM 11/19/2023    2:56 PM  Last 3 Weights  Weight (lbs) 217 lb 6.4 oz 218 lb 4.1 oz 219 lb  Weight (kg) 98.612 kg 99 kg 99.338 kg      Telemetry    Nsr - Personally Reviewed  ECG     - Personally Reviewed  Physical Exam   GEN: No acute distress.   Neck: No JVD Cardiac: RRR, no murmurs, rubs, or gallops.  Respiratory: Clear to auscultation bilaterally. GI: Soft, nontender, non-distended  MS: Right radial cath site looks great.  He has a good pulse.  Neuro:  Nonfocal  Psych: Normal affect   Labs    High Sensitivity Troponin:   Recent Labs  Lab 01/02/24 1720 01/02/24 1949 01/02/24 2311  TROPONINIHS 4 5 5      Chemistry Recent Labs  Lab 01/02/24 1720 01/03/24 0617 01/04/24 0616  NA 137 135 137  K 3.7 3.7 4.1  CL 99 101 96*  CO2 27 25 30   GLUCOSE 175* 158* 148*  BUN 9 8 9   CREATININE 1.07 1.11 1.24  CALCIUM 10.0 9.0 9.0  MG  --   --  2.0  PROT 7.8 6.8  --   ALBUMIN 3.9 3.5  --   AST 61* 65*  --   ALT 71* 59*  --   ALKPHOS 75 63  --   BILITOT 0.8 1.2  --   GFRNONAA >60 >60 >60  ANIONGAP 11 9 11     Lipids  Recent Labs  Lab 01/02/24 1943  CHOL 103  TRIG 281*  HDL 20*  LDLCALC 27  CHOLHDL 5.2    Hematology Recent Labs  Lab 01/02/24 1720 01/04/24 0616  WBC 8.6 8.1  RBC 5.17 5.27  HGB 15.9 16.1  HCT 45.7 46.0  MCV 88.4 87.3  MCH 30.8 30.6  MCHC 34.8 35.0  RDW 13.0 13.2  PLT 260 248   Thyroid No results for input(s): "TSH", "FREET4" in the last 168 hours.  BNPNo results for input(s): "BNP", "PROBNP" in the last 168 hours.  DDimer No results for input(s): "DDIMER" in the last 168 hours.   Radiology    CARDIAC  CATHETERIZATION Result Date: 01/03/2024   RPAV lesion is 45% stenosed.   Mid Cx lesion is 60% stenosed.   Ost 1st Diag lesion is 50% stenosed.   Ost 2nd Diag lesion is 55% stenosed.   1st RPL lesion is 60% stenosed.   Ost Cx to Prox Cx lesion is 20% stenosed.   2nd Mrg lesion is 99% stenosed.   Previously placed Prox LAD to Mid LAD stent of unknown type is  widely patent with 0% stenosed side branch in Ost 2nd Sept.   Previously placed Mid LAD to [facility] LAD stent of unknown type is  widely patent. Patent mid and distal LAD stents without restenosis. The small caliber first and second Diagonal branches are jailed by the LAD stent but have good flow. The Circumflex has a moderate stenosis mid vessel stenosis, unchanged from last cath. The obtuse marginal branch bifurcates. The most distal segment of the superior branch of the obtuse marginal has severe stenosis (too small for PCI). The large dominant RCA has diffuse mild disease in the proximal and mid vessel. The posterolateral artery has moderate non-obstructive disease. The small caliber PDA has a moderate stenosis. Normal LV filling pressure. Recommendations: Medical management of CAD.   DG Chest 2 View Result Date: 01/02/2024 CLINICAL DATA:  Chest pain, hypertension EXAM: CHEST - 2 VIEW COMPARISON:  02/25/2023 FINDINGS: Frontal and lateral views of the chest demonstrate an unremarkable cardiac silhouette. No acute airspace disease, effusion, or pneumothorax. No acute bony abnormalities. IMPRESSION: 1. No acute intrathoracic process. Electronically Signed   By: Bobbye Burrow M.D.   On: 01/02/2024 19:54    Cardiac Studies     Patient Profile     53 y.o. male   Assessment & Plan    1.  Coronary artery disease: Patient had a heart catheterization yesterday.  His LAD stents are widely patent.  He does have a very small distal second obtuse marginal stenosis.  This area is too small to consider stenting and is in the very distal aspect of the vessel.   Medical management was recommended.  He is already on metoprolol with a heart rate in the low 60s. Blood pressures in the low normal side.  If his blood pressure should increase we can consider isosorbide 30 mg.  For now I think that he needs better control of his diabetes and he needs to stop smoking.  We discussed these issues this morning in the hospital.   He will need a return to work note for Thursday, April 17.  He will follow-up with us  in several weeks.  Continue Plavix 75 mg a day.  I do not think he necessarily needs aspirin at this point since he did not receive another stent.    2.  Hyperlipidemia: His last LDL is 27.  Continue rosuvastatin.  3. DM:  newly diangosed.  Will follow up with his primary   Needs to stop smoking     [facility] will sign off.   Medication Recommendations:   Other recommendations (labs, testing, etc):   Follow up as an outpatient:  with Dr. Paulita Boss or APP   For questions or updates, please contact [facility] Please consult [email or web address] for contact info under        Signed, Ahmad Alert, MD  01/04/2024, 9:09 AM

## 2024-01-04 NOTE — Plan of Care (Signed)
 Patient is discharged to home. All personal belongings were returned. PIV removed. Discharged instruction given.

## 2024-01-04 NOTE — Progress Notes (Signed)
 Echocardiogram 2D Echocardiogram has been performed.  Bruce King 01/04/2024, 3:18 PM

## 2024-01-06 NOTE — Telephone Encounter (Signed)
 Nurses Please go ahead and give Bruce King an office visit with me-same-day Wednesday afternoon thank you

## 2024-01-07 MED FILL — Lidocaine HCl Local Preservative Free (PF) Inj 1%: INTRAMUSCULAR | Qty: 30 | Status: AC

## 2024-01-08 ENCOUNTER — Ambulatory Visit (INDEPENDENT_AMBULATORY_CARE_PROVIDER_SITE_OTHER): Payer: Self-pay | Admitting: Family Medicine

## 2024-01-08 VITALS — BP 107/76 | HR 96 | Temp 98.4°F | Ht 73.0 in | Wt 215.0 lb

## 2024-01-08 DIAGNOSIS — E785 Hyperlipidemia, unspecified: Secondary | ICD-10-CM

## 2024-01-08 DIAGNOSIS — E1159 Type 2 diabetes mellitus with other circulatory complications: Secondary | ICD-10-CM

## 2024-01-08 DIAGNOSIS — Z79899 Other long term (current) drug therapy: Secondary | ICD-10-CM

## 2024-01-08 DIAGNOSIS — Z125 Encounter for screening for malignant neoplasm of prostate: Secondary | ICD-10-CM

## 2024-01-08 DIAGNOSIS — I10 Essential (primary) hypertension: Secondary | ICD-10-CM

## 2024-01-08 NOTE — Progress Notes (Unsigned)
   Subjective:    Patient ID: Bruce King, male    DOB: [birth year], 53 y.o.   MRN: [id number]  HPI Pt comes in today for hospital follow up. Allergies and medications reviewed. .  Next patient In the hospital with chest pain discomfort Now doing somewhat better Had a catheterization that showed 2 open stents Does show a small blood vessel that had 90% blockage but this is not severe according to the cardiologist as far as triggering the possibility of a severe heart attack He is taking all his medications He did state he would like to hold off on metformin because he had diarrhea He states he has been doing much better job with dietary measures and regular physical activity to get under control He is checking his sugars periodically  Review of Systems     Objective:   Physical Exam General-in no acute distress Eyes-no discharge Lungs-respiratory rate normal, CTA CV-no murmurs,RRR Extremities skin warm dry no edema Neuro grossly normal Behavior normal, alert        Assessment & Plan:  Coronary artery disease-first thing he needs to do is to quit smoking.  We did discuss this in detail He also needs to get cholesterol under very good control continue his statin In addition to this very important to get A1c under better control He prefers dietary measures with exercise Information printed off for the patient He will check his sugars periodically on a regular basis each week send us  updates every few weeks The goal is to get fasting sugar below 125 and to get later in the day below 150 In addition to this would check an A1c lipid liver metabolic 7 in approximately 3 months His blood pressure is lower than what I would like for it to be he no longer needs the 160 mg of valsartan reduce this to 80 mg but to follow everything closely and if his numbers start going up resume a full tablet Also over the course of the next 3 to 6 weeks if we are not seeing progress in his  glucose he needs to start metformin If he does not tolerate metformin we will try extended release Does not need insulin currently Patient is aware of all of this and will send us  regular updates and he will reach out if his sugars start getting excessively high 300 or 400+  He has a follow-up in August but he needs to send us  regular updates every few weeks he understands this

## 2024-01-14 ENCOUNTER — Inpatient Hospital Stay: Payer: Self-pay | Admitting: Family Medicine

## 2024-01-23 ENCOUNTER — Encounter: Payer: Self-pay | Admitting: Family Medicine

## 2024-01-23 NOTE — Progress Notes (Addendum)
 Cardiology Office Note    Patient Name: Bruce King Date of Encounter: 01/23/2024  Primary Care Provider:  Bennet Brasil, MD Primary Cardiologist:  Jann Melody, MD Primary Electrophysiologist: None   Past Medical History    Past Medical History:  Diagnosis Date   Anxiety    Chest pain 09/2018   Coronary artery disease    Depression    GAD (generalized anxiety disorder)    GERD (gastroesophageal reflux disease)    History of hiatal hernia    Hyperlipidemia    NSTEMI (non-ST elevated myocardial infarction) (HCC) 10/30/2016    History of Present Illness  Bruce King  is a 53 year old male with a PMH of CAD s/p NSTEMI 2018 treated with DES x 3 to the LAD, HTN, HLD, anxiety, tobacco abuse who presents today for post hospital follow-up.  Bruce King was last seen by Dr. Annabelle Barrack on 06/27/2023 and reported that he had resumed smoking. He also noted decreased energy levels and metoprolol  was decreased to 25 mg in the morning and 25 mg in the evening.  His BP was well controlled  and reported leg cramps with electrolytes checked.    He presented to the ED on 01/02/24 with complaint of chest pain.  He reported chest pain that was occurring off and on for the past 2 weeks and worse with physical exertion.  He reported not trying nitroglycerin  at home and blood pressure was noted to be elevated.  He described pain radiating to bilateral shoulders with similar discomfort noted with his prior PCI in 2018.  Blood pressure was elevated at 157/109 and EKG was completed showing sinus rhythm with troponins negative x 3 and transaminitis mildly elevated.  Chest x-ray was also completed with no acute findings.  2D echo was completed with EF of 60% and no RWMA and grade 1 DD.  He underwent LHC on 01/03/2024 that showed moderate CAD with widely patent stents in the mid LAD and severely diseased second OM that was too small for PCI and medical management recommended.  He was  diagnosed with DM type II with an A1c of 10.3.  He was discharged with metformin  500 mg twice daily and advised to follow-up with his PCP.  Bruce King presents today for posthospital follow-up.  He reports since his discharge doing well and feeling better than he has in a long time.  During his hospitalization he was diagnosed with DM type II.  He has made significant lifestyle changes, including eliminating sugar from his diet. His blood sugar levels have improved, with morning readings around 111 mg/dL and evening readings between 84 to 95 mg/dL. He is not on any diabetes medication and is managing his condition through diet and lifestyle changes. He has a history of smoking and has resumed smoking, although he has reduced his intake to less than a pack a day. He is focusing on dietary changes first. He has increased water intake, reduced consumption of sugary drinks, started walking more, and lost a few pounds, now weighing 215 pounds, down from 219 pounds. He is focusing on a protein-rich diet, avoiding red meat, and consuming more chicken and fish. He has experienced leg cramps in the past, which have improved since his dietary changes. He attributes the cramps to dehydration and high blood sugar levels. He has no known family history of diabetes, although some cousins have the condition. He was surprised by his diabetes diagnosis as he was unaware of any symptoms prior to his  hospitalization. Patient denies chest pain, palpitations, dyspnea, PND, orthopnea, nausea, vomiting, dizziness, syncope, edema, weight gain, or early satiety.  Discussed the use of AI scribe software for clinical note transcription with the patient, who gave verbal consent to proceed.  History of Present Illness   Review of Systems  Please see the history of present illness.    All other systems reviewed and are otherwise negative except as noted above.  Physical Exam    Wt Readings from Last 3 Encounters:  01/08/24 215  lb (97.5 kg)  01/03/24 217 lb 6.4 oz (98.6 kg)  11/19/23 219 lb (99.3 kg)   OZ:HYQMV were no vitals filed for this visit.,There is no height or weight on file to calculate BMI. GEN: Well nourished, well developed in no acute distress Neck: No JVD; No carotid bruits Pulmonary: Clear to auscultation without rales, wheezing or rhonchi  Cardiovascular: Normal rate. Regular rhythm. Normal S1. Normal S2.   Murmurs: There is no murmur.  ABDOMEN: Soft, non-tender, non-distended EXTREMITIES:  No edema; No deformity   EKG/LABS/ Recent Cardiac Studies   ECG personally reviewed by me today -none completed today  Risk Assessment/Calculations:          Lab Results  Component Value Date   WBC 8.1 01/04/2024   HGB 16.1 01/04/2024   HCT 46.0 01/04/2024   MCV 87.3 01/04/2024   PLT 248 01/04/2024   Lab Results  Component Value Date   CREATININE 1.24 01/04/2024   BUN 9 01/04/2024   NA 137 01/04/2024   K 4.1 01/04/2024   CL 96 (L) 01/04/2024   CO2 30 01/04/2024   Lab Results  Component Value Date   CHOL 103 01/02/2024   HDL 20 (L) 01/02/2024   LDLCALC 27 01/02/2024   TRIG 281 (H) 01/02/2024   CHOLHDL 5.2 01/02/2024    Lab Results  Component Value Date   HGBA1C 10.3 (H) 01/02/2024   Assessment & Plan    1.Coronary artery disease: -s/p NSTEMI 2018 with DES x 3 to LAD and redeveloped LHC in 2019 with patent stent.  Patient presented to ED on/9/25 with complaint of chest pain. - He underwent LHC that showed  widely patent stents in the mid LAD and severely diseased second OM that was too small for PCI and medical management recommended - Continue current GDMT with Plavix  75 mg, fenofibrate  160 mg, Nitrostat  0.4 mg, Crestor  20 mg daily.  2.  Essential hypertension: - Patient's blood pressure today was 118/78 - Continue metoprolol  25 mg twice daily and Diovan  80 mg daily  3.  Hyperlipidemia: - Patient's LDL cholesterol is controlled at 27 and triglycerides were 281 - He has  recently decreased his sugar consumption and will have lipids rechecked in 1 year.  4.  DM type II: -Newly diagnosed, initial A1c 10.3%. Improved glucose levels with lifestyle changes. Prefers lifestyle management. -Patient deferred medication with metformin  at this time. - Continue lifestyle modifications. - Recheck A1c in July. - We will check BMET today  5. Tobacco abuse: -Reduced to less than a pack a day. Plans to quit. Smoking linked to routines. - Continue efforts to reduce smoking. - Consider behavioral strategies to dissociate smoking from routines.  Disposition: Follow-up with Jann Melody, MD or APP in 6 months   Signed, Francene Ing, Retha Cast, NP 01/23/2024, 6:53 PM [facility]

## 2024-01-24 ENCOUNTER — Ambulatory Visit: Payer: Self-pay | Attending: Nurse Practitioner | Admitting: Nurse Practitioner

## 2024-01-24 ENCOUNTER — Encounter: Payer: Self-pay | Admitting: Nurse Practitioner

## 2024-01-24 VITALS — BP 118/78 | HR 71 | Ht 73.0 in | Wt 215.2 lb

## 2024-01-24 DIAGNOSIS — I25118 Atherosclerotic heart disease of native coronary artery with other forms of angina pectoris: Secondary | ICD-10-CM

## 2024-01-24 DIAGNOSIS — E1159 Type 2 diabetes mellitus with other circulatory complications: Secondary | ICD-10-CM

## 2024-01-24 DIAGNOSIS — Z720 Tobacco use: Secondary | ICD-10-CM

## 2024-01-24 DIAGNOSIS — E782 Mixed hyperlipidemia: Secondary | ICD-10-CM

## 2024-01-24 DIAGNOSIS — I10 Essential (primary) hypertension: Secondary | ICD-10-CM

## 2024-01-24 NOTE — Patient Instructions (Signed)
 Medication Instructions:  Your physician recommends that you continue on your current medications as directed. Please refer to the Current Medication list given to you today. *If you need a refill on your cardiac medications before your next appointment, please call your pharmacy*  Lab Work: TODAY-BMET If you have labs (blood work) drawn today and your tests are completely normal, you will receive your results only by: MyChart Message (if you have MyChart) OR A paper copy in the mail If you have any lab test that is abnormal or we need to change your treatment, we will call you to review the results.  Testing/Procedures: NONE ORDERED  Follow-Up: At [facility], you and your health needs are our priority.  As part of our continuing mission to provide you with exceptional heart care, our providers are all part of one team.  This team includes your primary Cardiologist (physician) and Advanced Practice Providers or APPs (Physician Assistants and Nurse Practitioners) who all work together to provide you with the care you need, when you need it.  Your next appointment:   6 month(s)  Provider:   Jann Melody, MD or Charles Connor, NP  We recommend signing up for the patient portal called "MyChart".  Sign up information is provided on this After Visit Summary.  MyChart is used to connect with patients for Virtual Visits (Telemedicine).  Patients are able to view lab/test results, encounter notes, upcoming appointments, etc.  Non-urgent messages can be sent to your provider as well.   To learn more about what you can do with MyChart, go to [email or web address].   Other Instructions

## 2024-01-25 LAB — BASIC METABOLIC PANEL WITH GFR
BUN/Creatinine Ratio: 15 (ref 9–20)
BUN: 18 mg/dL (ref 6–24)
CO2: 20 mmol/L (ref 20–29)
Calcium: 9.9 mg/dL (ref 8.7–10.2)
Chloride: 105 mmol/L (ref 96–106)
Creatinine, Ser: 1.18 mg/dL (ref 0.76–1.27)
Glucose: 94 mg/dL (ref 70–99)
Potassium: 4.6 mmol/L (ref 3.5–5.2)
Sodium: 144 mmol/L (ref 134–144)
eGFR: 74 mL/min/{1.73_m2} (ref >59)

## 2024-03-31 ENCOUNTER — Other Ambulatory Visit: Payer: Self-pay | Admitting: Internal Medicine

## 2024-05-19 ENCOUNTER — Encounter: Payer: Self-pay | Admitting: Family Medicine

## 2024-05-27 ENCOUNTER — Ambulatory Visit (INDEPENDENT_AMBULATORY_CARE_PROVIDER_SITE_OTHER): Payer: Self-pay | Admitting: Family Medicine

## 2024-05-27 ENCOUNTER — Encounter: Payer: Self-pay | Admitting: Family Medicine

## 2024-05-27 VITALS — BP 118/76 | HR 67 | Temp 97.5°F | Ht 73.0 in | Wt 199.0 lb

## 2024-05-27 DIAGNOSIS — E785 Hyperlipidemia, unspecified: Secondary | ICD-10-CM

## 2024-05-27 DIAGNOSIS — I10 Essential (primary) hypertension: Secondary | ICD-10-CM

## 2024-05-27 DIAGNOSIS — F17200 Nicotine dependence, unspecified, uncomplicated: Secondary | ICD-10-CM

## 2024-05-27 DIAGNOSIS — E1159 Type 2 diabetes mellitus with other circulatory complications: Secondary | ICD-10-CM

## 2024-05-27 DIAGNOSIS — E1169 Type 2 diabetes mellitus with other specified complication: Secondary | ICD-10-CM

## 2024-05-27 DIAGNOSIS — Z8679 Personal history of other diseases of the circulatory system: Secondary | ICD-10-CM

## 2024-05-27 DIAGNOSIS — F411 Generalized anxiety disorder: Secondary | ICD-10-CM

## 2024-05-27 MED ORDER — ALPRAZOLAM 0.5 MG PO TABS: ORAL_TABLET | ORAL | 5 refills | Status: AC

## 2024-05-27 MED ORDER — VALSARTAN 80 MG PO TABS
80.0000 mg | ORAL_TABLET | Freq: Every day | ORAL | 3 refills | Status: AC
Start: 2024-05-27 — End: ?

## 2024-05-27 NOTE — Progress Notes (Signed)
   Subjective:    Patient ID: Bruce King, male    DOB: 1971-03-21, 53 y.o.   MRN: [id number]  HPI 3 month f/u  DM2 Hyperlipidemia Hypertension  Discussed the use of AI scribe software for clinical note transcription with the patient, who gave verbal consent to proceed.  History of Present Illness   Bruce King is a 53 year old male with diabetes and coronary artery disease who presents for follow-up on his blood sugar management and cardiovascular health.  He was hospitalized earlier in the spring due to chest pain and elevated glucose. Since then, he has focused on healthy eating, avoiding sugar, and minimizing bread and potatoes. He mentions having only one piece of cake at a family event and primarily consuming wheat bread. He checks his blood sugar every two to three days, with recent readings not exceeding 115 mg/dL.  He has a history of coronary artery disease and is mindful of lifestyle factors such as diet and physical activity. He reports no chest pressure. His highest reported blood pressure reading has been 140/89 mmHg. He is currently taking half of his previous dose of valsartan , specifically 80 mg daily, and has not experienced readings over 150 mmHg.  He is consistent with his medications, including cholesterol medication, metoprolol , fenofibrate , and Plavix . He has not refilled fenofibrate  recently, as he wants to assess the impact of his dietary changes on his blood work.  He has lost 20 pounds, attributing this to dietary changes rather than increased physical activity. He mentions a lack of motivation for daily tasks, which he attributes to his demanding work schedule, running two mail routes daily. He has not been on Zoloft  for about a year and does not believe his current state is related to stopping the medication.  He is trying to quit smoking but has not yet succeeded. He has significantly reduced sugar intake and hopes to eventually quit smoking as well. He  works six days a week and finds it challenging to maintain motivation for activities outside of work.        Review of Systems     Objective:   Physical Exam  General-in no acute distress Eyes-no discharge Lungs-respiratory rate normal, CTA CV-no murmurs,RRR Extremities skin warm dry no edema Neuro grossly normal Behavior normal, alert       Assessment & Plan:  1. Type 2 diabetes mellitus with other circulatory complication, without long-term current use of insulin  (HCC) (Primary) He would prefer not to send Working hard on diet Will do lab work we will see what that shows  2. Essential hypertension Blood pressure good control continue current measures encourage patient to try to quit smoking  3. Hyperlipidemia associated with type 2 diabetes mellitus (HCC) Keep LDL below 70 await lab work.  Continue current measures Patient relates currently not taking fenofibrate  4. Generalized anxiety disorder Uses alprazolam  but does not abuse it  5. Smoker Patient has been counseled to quit smoking  6. History of CAD (coronary artery disease) Keep risk factors under good control

## 2024-05-28 ENCOUNTER — Ambulatory Visit: Payer: Self-pay | Admitting: Family Medicine

## 2024-05-28 ENCOUNTER — Other Ambulatory Visit: Payer: Self-pay

## 2024-05-28 DIAGNOSIS — E1169 Type 2 diabetes mellitus with other specified complication: Secondary | ICD-10-CM

## 2024-05-28 DIAGNOSIS — E1159 Type 2 diabetes mellitus with other circulatory complications: Secondary | ICD-10-CM

## 2024-05-28 DIAGNOSIS — I10 Essential (primary) hypertension: Secondary | ICD-10-CM

## 2024-05-28 LAB — LIPID PANEL
Chol/HDL Ratio: 4.6 ratio (ref 0.0–5.0)
Cholesterol, Total: 119 mg/dL (ref 100–199)
HDL: 26 mg/dL — ABNORMAL LOW (ref >39)
LDL Chol Calc (NIH): 71 mg/dL (ref 0–99)
Triglycerides: 121 mg/dL (ref 0–149)
VLDL Cholesterol Cal: 22 mg/dL (ref 5–40)

## 2024-05-28 LAB — BASIC METABOLIC PANEL WITH GFR
BUN/Creatinine Ratio: 12 (ref 9–20)
BUN: 14 mg/dL (ref 6–24)
CO2: 24 mmol/L (ref 20–29)
Calcium: 10.4 mg/dL — ABNORMAL HIGH (ref 8.7–10.2)
Chloride: 101 mmol/L (ref 96–106)
Creatinine, Ser: 1.17 mg/dL (ref 0.76–1.27)
Glucose: 93 mg/dL (ref 70–99)
Potassium: 4.7 mmol/L (ref 3.5–5.2)
Sodium: 142 mmol/L (ref 134–144)
eGFR: 75 mL/min/1.73 (ref >59)

## 2024-05-28 LAB — HEPATIC FUNCTION PANEL
ALT: 18 IU/L (ref 0–44)
AST: 20 IU/L (ref 0–40)
Albumin: 5 g/dL — ABNORMAL HIGH (ref 3.8–4.9)
Alkaline Phosphatase: 79 IU/L (ref 44–121)
Bilirubin Total: 0.6 mg/dL (ref 0.0–1.2)
Bilirubin, Direct: 0.15 mg/dL (ref 0.00–0.40)
Total Protein: 8.1 g/dL (ref 6.0–8.5)

## 2024-05-28 LAB — HEMOGLOBIN A1C
Est. average glucose Bld gHb Est-mCnc: 123 mg/dL
Hgb A1c MFr Bld: 5.9 % — ABNORMAL HIGH (ref 4.8–5.6)

## 2024-05-28 LAB — PSA: Prostate Specific Ag, Serum: 1 ng/mL (ref 0.0–4.0)

## 2024-06-27 ENCOUNTER — Other Ambulatory Visit: Payer: Self-pay | Admitting: Internal Medicine

## 2024-09-25 ENCOUNTER — Other Ambulatory Visit: Payer: Self-pay | Admitting: Internal Medicine

## 2024-09-29 ENCOUNTER — Other Ambulatory Visit: Payer: Self-pay | Admitting: Internal Medicine

## 2024-11-25 ENCOUNTER — Ambulatory Visit: Payer: Self-pay | Admitting: Family Medicine
